# Patient Record
Sex: Female | Born: 1985 | Race: White | Hispanic: No | Marital: Single | Smoking: Former smoker
Health system: Southern US, Community
[De-identification: ages and names within clinical notes are randomized; demographics above are authoritative.]

## PROBLEM LIST (undated history)

## (undated) DIAGNOSIS — Z973 Presence of spectacles and contact lenses: Secondary | ICD-10-CM

## (undated) DIAGNOSIS — J189 Pneumonia, unspecified organism: Secondary | ICD-10-CM

## (undated) DIAGNOSIS — Z87442 Personal history of urinary calculi: Secondary | ICD-10-CM

## (undated) DIAGNOSIS — G709 Myoneural disorder, unspecified: Secondary | ICD-10-CM

## (undated) DIAGNOSIS — F32A Depression, unspecified: Secondary | ICD-10-CM

## (undated) DIAGNOSIS — N281 Cyst of kidney, acquired: Secondary | ICD-10-CM

## (undated) DIAGNOSIS — Z87448 Personal history of other diseases of urinary system: Secondary | ICD-10-CM

## (undated) DIAGNOSIS — M1990 Unspecified osteoarthritis, unspecified site: Secondary | ICD-10-CM

## (undated) DIAGNOSIS — G4700 Insomnia, unspecified: Secondary | ICD-10-CM

## (undated) DIAGNOSIS — F329 Major depressive disorder, single episode, unspecified: Secondary | ICD-10-CM

## (undated) DIAGNOSIS — R768 Other specified abnormal immunological findings in serum: Secondary | ICD-10-CM

## (undated) DIAGNOSIS — Z960 Presence of urogenital implants: Secondary | ICD-10-CM

## (undated) DIAGNOSIS — N189 Chronic kidney disease, unspecified: Secondary | ICD-10-CM

## (undated) DIAGNOSIS — K589 Irritable bowel syndrome without diarrhea: Secondary | ICD-10-CM

## (undated) DIAGNOSIS — D649 Anemia, unspecified: Secondary | ICD-10-CM

## (undated) DIAGNOSIS — K219 Gastro-esophageal reflux disease without esophagitis: Secondary | ICD-10-CM

## (undated) DIAGNOSIS — R51 Headache: Secondary | ICD-10-CM

## (undated) DIAGNOSIS — M797 Fibromyalgia: Secondary | ICD-10-CM

## (undated) DIAGNOSIS — E1065 Type 1 diabetes mellitus with hyperglycemia: Secondary | ICD-10-CM

## (undated) DIAGNOSIS — IMO0002 Reserved for concepts with insufficient information to code with codable children: Secondary | ICD-10-CM

## (undated) DIAGNOSIS — R519 Headache, unspecified: Secondary | ICD-10-CM

## (undated) DIAGNOSIS — I10 Essential (primary) hypertension: Secondary | ICD-10-CM

## (undated) DIAGNOSIS — F419 Anxiety disorder, unspecified: Secondary | ICD-10-CM

## (undated) DIAGNOSIS — F5000 Anorexia nervosa, unspecified: Secondary | ICD-10-CM

## (undated) HISTORY — PX: LEEP: SHX91

## (undated) HISTORY — PX: BREAST EXCISIONAL BIOPSY: SUR124

## (undated) HISTORY — PX: BREAST LUMPECTOMY: SHX2

## (undated) HISTORY — PX: BREAST BIOPSY: SHX20

---
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
## 2005-12-26 ENCOUNTER — Encounter: Admission: RE | Admit: 2005-12-26 | Discharge: 2005-12-26 | Payer: Self-pay | Admitting: Family Medicine

## 2006-07-08 ENCOUNTER — Encounter: Admission: RE | Admit: 2006-07-08 | Discharge: 2006-07-08 | Payer: Self-pay | Admitting: *Deleted

## 2007-01-02 HISTORY — PX: LEEP: SHX91

## 2007-12-29 ENCOUNTER — Emergency Department (HOSPITAL_COMMUNITY): Admission: EM | Admit: 2007-12-29 | Discharge: 2007-12-29 | Payer: Self-pay | Admitting: Family Medicine

## 2008-09-09 ENCOUNTER — Encounter: Payer: Self-pay | Admitting: Pediatric Cardiology

## 2008-10-07 ENCOUNTER — Encounter: Payer: Self-pay | Admitting: Obstetrics & Gynecology

## 2008-10-21 ENCOUNTER — Encounter: Payer: Self-pay | Admitting: Obstetrics and Gynecology

## 2008-10-23 ENCOUNTER — Ambulatory Visit: Payer: Self-pay | Admitting: Critical Care Medicine

## 2008-10-23 ENCOUNTER — Inpatient Hospital Stay (HOSPITAL_COMMUNITY): Admission: AD | Admit: 2008-10-23 | Discharge: 2008-10-25 | Payer: Self-pay | Admitting: Obstetrics and Gynecology

## 2008-10-27 ENCOUNTER — Inpatient Hospital Stay (HOSPITAL_COMMUNITY): Admission: AD | Admit: 2008-10-27 | Discharge: 2008-11-03 | Payer: Self-pay | Admitting: Obstetrics

## 2008-10-28 ENCOUNTER — Encounter: Payer: Self-pay | Admitting: Maternal and Fetal Medicine

## 2008-11-04 ENCOUNTER — Encounter: Payer: Self-pay | Admitting: Maternal & Fetal Medicine

## 2008-12-20 ENCOUNTER — Encounter: Payer: Self-pay | Admitting: Maternal and Fetal Medicine

## 2008-12-27 ENCOUNTER — Encounter: Payer: Self-pay | Admitting: Maternal and Fetal Medicine

## 2009-01-05 ENCOUNTER — Inpatient Hospital Stay (HOSPITAL_COMMUNITY): Admission: AD | Admit: 2009-01-05 | Discharge: 2009-01-05 | Payer: Self-pay | Admitting: Obstetrics & Gynecology

## 2010-01-22 ENCOUNTER — Encounter: Payer: Self-pay | Admitting: Obstetrics

## 2010-01-23 ENCOUNTER — Encounter: Payer: Self-pay | Admitting: Obstetrics & Gynecology

## 2010-03-19 LAB — CBC
HCT: 35.9 % — ABNORMAL LOW (ref 36.0–46.0)
Platelets: 184 10*3/uL (ref 150–400)
RBC: 4.37 MIL/uL (ref 3.87–5.11)
WBC: 7.9 10*3/uL (ref 4.0–10.5)

## 2010-03-19 LAB — BASIC METABOLIC PANEL
CO2: 20 mEq/L (ref 19–32)
Calcium: 8.4 mg/dL (ref 8.4–10.5)
Chloride: 106 mEq/L (ref 96–112)
GFR calc Af Amer: >60 mL/min (ref >60)
GFR calc non Af Amer: >60 mL/min (ref >60)
Potassium: 3.7 mEq/L (ref 3.5–5.1)

## 2010-03-19 LAB — RPR: RPR Ser Ql: NONREACTIVE

## 2010-04-05 LAB — GLUCOSE, CAPILLARY
Glucose-Capillary: 101 mg/dL — ABNORMAL HIGH (ref 70–99)
Glucose-Capillary: 128 mg/dL — ABNORMAL HIGH (ref 70–99)
Glucose-Capillary: 131 mg/dL — ABNORMAL HIGH (ref 70–99)
Glucose-Capillary: 152 mg/dL — ABNORMAL HIGH (ref 70–99)
Glucose-Capillary: 155 mg/dL — ABNORMAL HIGH (ref 70–99)
Glucose-Capillary: 169 mg/dL — ABNORMAL HIGH (ref 70–99)
Glucose-Capillary: 174 mg/dL — ABNORMAL HIGH (ref 70–99)
Glucose-Capillary: 60 mg/dL — ABNORMAL LOW (ref 70–99)

## 2010-04-05 LAB — T4, FREE: Free T4: 0.87 ng/dL (ref 0.80–1.80)

## 2010-04-05 LAB — TSH: TSH: 1.285 u[IU]/mL (ref 0.350–4.500)

## 2010-04-06 LAB — BASIC METABOLIC PANEL
BUN: 10 mg/dL (ref 6–23)
BUN: 10 mg/dL (ref 6–23)
BUN: 12 mg/dL (ref 6–23)
BUN: 15 mg/dL (ref 6–23)
BUN: 8 mg/dL (ref 6–23)
BUN: 9 mg/dL (ref 6–23)
BUN: 9 mg/dL (ref 6–23)
CO2: 10 mEq/L — ABNORMAL LOW (ref 19–32)
CO2: 18 mEq/L — ABNORMAL LOW (ref 19–32)
Calcium: 7.4 mg/dL — ABNORMAL LOW (ref 8.4–10.5)
Calcium: 7.7 mg/dL — ABNORMAL LOW (ref 8.4–10.5)
Calcium: 8.3 mg/dL — ABNORMAL LOW (ref 8.4–10.5)
Calcium: 8.5 mg/dL (ref 8.4–10.5)
Calcium: 8.6 mg/dL (ref 8.4–10.5)
Calcium: 8.7 mg/dL (ref 8.4–10.5)
Calcium: 8.8 mg/dL (ref 8.4–10.5)
Calcium: 9 mg/dL (ref 8.4–10.5)
Chloride: 109 mEq/L (ref 96–112)
Chloride: 110 mEq/L (ref 96–112)
Chloride: 110 mEq/L (ref 96–112)
Chloride: 111 mEq/L (ref 96–112)
Chloride: 112 mEq/L (ref 96–112)
Chloride: 112 mEq/L (ref 96–112)
Creatinine, Ser: 0.32 mg/dL — ABNORMAL LOW (ref 0.4–1.2)
Creatinine, Ser: 0.36 mg/dL — ABNORMAL LOW (ref 0.4–1.2)
Creatinine, Ser: 0.49 mg/dL (ref 0.4–1.2)
Creatinine, Ser: 0.5 mg/dL (ref 0.4–1.2)
Creatinine, Ser: 0.51 mg/dL (ref 0.4–1.2)
Creatinine, Ser: 0.81 mg/dL (ref 0.4–1.2)
GFR calc Af Amer: >60 mL/min (ref >60)
GFR calc Af Amer: >60 mL/min (ref >60)
GFR calc Af Amer: >60 mL/min (ref >60)
GFR calc Af Amer: >60 mL/min (ref >60)
GFR calc Af Amer: >60 mL/min (ref >60)
GFR calc Af Amer: >60 mL/min (ref >60)
GFR calc non Af Amer: >60 mL/min (ref >60)
GFR calc non Af Amer: >60 mL/min (ref >60)
GFR calc non Af Amer: >60 mL/min (ref >60)
GFR calc non Af Amer: >60 mL/min (ref >60)
GFR calc non Af Amer: >60 mL/min (ref >60)
GFR calc non Af Amer: >60 mL/min (ref >60)
GFR calc non Af Amer: >60 mL/min (ref >60)
GFR calc non Af Amer: >60 mL/min (ref >60)
GFR calc non Af Amer: >60 mL/min (ref >60)
Glucose, Bld: 130 mg/dL — ABNORMAL HIGH (ref 70–99)
Glucose, Bld: 166 mg/dL — ABNORMAL HIGH (ref 70–99)
Glucose, Bld: 194 mg/dL — ABNORMAL HIGH (ref 70–99)
Glucose, Bld: 251 mg/dL — ABNORMAL HIGH (ref 70–99)
Glucose, Bld: 405 mg/dL — ABNORMAL HIGH (ref 70–99)
Potassium: 3.1 mEq/L — ABNORMAL LOW (ref 3.5–5.1)
Potassium: 3.4 mEq/L — ABNORMAL LOW (ref 3.5–5.1)
Potassium: 3.4 mEq/L — ABNORMAL LOW (ref 3.5–5.1)
Potassium: 3.4 mEq/L — ABNORMAL LOW (ref 3.5–5.1)
Potassium: 3.5 mEq/L (ref 3.5–5.1)
Potassium: 3.7 mEq/L (ref 3.5–5.1)
Sodium: 128 mEq/L — ABNORMAL LOW (ref 135–145)
Sodium: 131 mEq/L — ABNORMAL LOW (ref 135–145)

## 2010-04-06 LAB — CBC
HCT: 45.2 % (ref 36.0–46.0)
Hemoglobin: 15.1 g/dL — ABNORMAL HIGH (ref 12.0–15.0)
MCHC: 34.1 g/dL (ref 30.0–36.0)
MCV: 85.1 fL (ref 78.0–100.0)
Platelets: 229 10*3/uL (ref 150–400)
RBC: 5.32 MIL/uL — ABNORMAL HIGH (ref 3.87–5.11)
RDW: 12.3 % (ref 11.5–15.5)
WBC: 14.5 10*3/uL — ABNORMAL HIGH (ref 4.0–10.5)

## 2010-04-06 LAB — URINE CULTURE

## 2010-04-06 LAB — GLUCOSE, CAPILLARY
Glucose-Capillary: 106 mg/dL — ABNORMAL HIGH (ref 70–99)
Glucose-Capillary: 137 mg/dL — ABNORMAL HIGH (ref 70–99)
Glucose-Capillary: 137 mg/dL — ABNORMAL HIGH (ref 70–99)
Glucose-Capillary: 152 mg/dL — ABNORMAL HIGH (ref 70–99)
Glucose-Capillary: 157 mg/dL — ABNORMAL HIGH (ref 70–99)
Glucose-Capillary: 157 mg/dL — ABNORMAL HIGH (ref 70–99)
Glucose-Capillary: 157 mg/dL — ABNORMAL HIGH (ref 70–99)
Glucose-Capillary: 166 mg/dL — ABNORMAL HIGH (ref 70–99)
Glucose-Capillary: 167 mg/dL — ABNORMAL HIGH (ref 70–99)
Glucose-Capillary: 170 mg/dL — ABNORMAL HIGH (ref 70–99)
Glucose-Capillary: 172 mg/dL — ABNORMAL HIGH (ref 70–99)
Glucose-Capillary: 172 mg/dL — ABNORMAL HIGH (ref 70–99)
Glucose-Capillary: 175 mg/dL — ABNORMAL HIGH (ref 70–99)
Glucose-Capillary: 186 mg/dL — ABNORMAL HIGH (ref 70–99)
Glucose-Capillary: 192 mg/dL — ABNORMAL HIGH (ref 70–99)
Glucose-Capillary: 194 mg/dL — ABNORMAL HIGH (ref 70–99)
Glucose-Capillary: 202 mg/dL — ABNORMAL HIGH (ref 70–99)
Glucose-Capillary: 269 mg/dL — ABNORMAL HIGH (ref 70–99)
Glucose-Capillary: 275 mg/dL — ABNORMAL HIGH (ref 70–99)
Glucose-Capillary: 285 mg/dL — ABNORMAL HIGH (ref 70–99)
Glucose-Capillary: 377 mg/dL — ABNORMAL HIGH (ref 70–99)
Glucose-Capillary: 463 mg/dL — ABNORMAL HIGH (ref 70–99)
Glucose-Capillary: 71 mg/dL (ref 70–99)
Glucose-Capillary: 81 mg/dL (ref 70–99)
Glucose-Capillary: 85 mg/dL (ref 70–99)
Glucose-Capillary: 95 mg/dL (ref 70–99)

## 2010-04-06 LAB — DIC (DISSEMINATED INTRAVASCULAR COAGULATION)PANEL
INR: 1.13 (ref 0.00–1.49)
Prothrombin Time: 14.4 seconds (ref 11.6–15.2)
Smear Review: NONE SEEN
aPTT: 29 seconds (ref 24–37)

## 2010-04-06 LAB — URINALYSIS, ROUTINE W REFLEX MICROSCOPIC
Bilirubin Urine: NEGATIVE
Ketones, ur: >80 mg/dL — AB
Nitrite: NEGATIVE
Specific Gravity, Urine: 1.02 (ref 1.005–1.030)
Urobilinogen, UA: 0.2 mg/dL (ref 0.0–1.0)

## 2010-04-06 LAB — COMPREHENSIVE METABOLIC PANEL
AST: 11 U/L (ref 0–37)
BUN: 20 mg/dL (ref 6–23)
CO2: 5 mEq/L — CL (ref 19–32)
Chloride: 98 mEq/L (ref 96–112)
Creatinine, Ser: 1.05 mg/dL (ref 0.4–1.2)
GFR calc non Af Amer: >60 mL/min (ref >60)
Total Bilirubin: 1.7 mg/dL — ABNORMAL HIGH (ref 0.3–1.2)

## 2010-04-06 LAB — BLOOD GAS, ARTERIAL
Bicarbonate: 8.2 mEq/L — ABNORMAL LOW (ref 20.0–24.0)
TCO2: 8.8 mmol/L (ref 0–100)
pO2, Arterial: 83.1 mmHg (ref 80.0–100.0)

## 2010-04-06 LAB — KETONES, URINE: Ketones, ur: NEGATIVE mg/dL

## 2010-04-06 LAB — KETONES, QUALITATIVE

## 2010-04-06 LAB — MAGNESIUM
Magnesium: 1.7 mg/dL (ref 1.5–2.5)
Magnesium: 1.8 mg/dL (ref 1.5–2.5)

## 2010-04-06 LAB — URIC ACID: Uric Acid, Serum: 4.6 mg/dL (ref 2.4–7.0)

## 2010-04-06 LAB — HEMOGLOBIN A1C
Hgb A1c MFr Bld: 11.4 % — ABNORMAL HIGH (ref 4.6–6.1)
Mean Plasma Glucose: 280 mg/dL

## 2010-04-06 LAB — WET PREP, GENITAL

## 2010-04-06 LAB — CREATININE CLEARANCE, URINE, 24 HOUR
Collection Interval-CRCL: 24 hours
Creatinine, Urine: 31.9 mg/dL
Creatinine: 0.57 mg/dL (ref 0.40–1.20)
Urine Total Volume-CRCL: 4200 mL

## 2010-04-06 LAB — PROTEIN, URINE, 24 HOUR: Protein, 24H Urine: 84 mg/d (ref 50–100)

## 2010-04-06 LAB — PHOSPHORUS: Phosphorus: 3.9 mg/dL (ref 2.3–4.6)

## 2010-10-06 LAB — POCT PREGNANCY, URINE: Preg Test, Ur: NEGATIVE

## 2010-10-07 ENCOUNTER — Encounter: Payer: Self-pay | Admitting: *Deleted

## 2010-10-07 ENCOUNTER — Emergency Department (HOSPITAL_BASED_OUTPATIENT_CLINIC_OR_DEPARTMENT_OTHER)
Admission: EM | Admit: 2010-10-07 | Discharge: 2010-10-07 | Disposition: A | Discharge status: Home / Self Care | Attending: Emergency Medicine | Admitting: Emergency Medicine

## 2010-10-07 DIAGNOSIS — E119 Type 2 diabetes mellitus without complications: Secondary | ICD-10-CM | POA: Insufficient documentation

## 2010-10-07 DIAGNOSIS — M778 Other enthesopathies, not elsewhere classified: Secondary | ICD-10-CM

## 2010-10-07 DIAGNOSIS — M65839 Other synovitis and tenosynovitis, unspecified forearm: Secondary | ICD-10-CM | POA: Insufficient documentation

## 2010-10-07 DIAGNOSIS — F17200 Nicotine dependence, unspecified, uncomplicated: Secondary | ICD-10-CM | POA: Insufficient documentation

## 2010-10-07 MED ORDER — HYDROCODONE-ACETAMINOPHEN 5-325 MG PO TABS: 2.0000 | ORAL_TABLET | ORAL | Status: AC | PRN

## 2010-10-07 NOTE — ED Provider Notes (Signed)
History     CSN: [id number] Arrival date & time: 10/07/2010 11:59 AM  Chief Complaint  Patient presents with  . Wrist Pain    (Consider location/radiation/quality/duration/timing/severity/associated sxs/prior treatment) HPI History provided by Pt. Pt c/o gradually worsening, non-traumatic right medial wrist pain x 2 days.   No-radiating.  Aggravated by movement.  Associated w/ mild edema.  No fever, skin changes, paresthesias.  Does not have pain in any other joint.  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    Past Medical History  Diagnosis Date  . Diabetes mellitus     Past Surgical History  Procedure Date  . Other surgical history     cyst removed from face    No family history on file.  History  Substance Use Topics  . Smoking status: Current Everyday Smoker  . Smokeless tobacco: Not on file  . Alcohol Use: No    OB History    Grav Para Term Preterm Abortions TAB SAB Ect Mult Living                  Review of Systems  All other systems reviewed and are negative.    Allergies  Adhesive and Ivp dye  Home Medications   Current Outpatient Rx  Name Route Sig Dispense Refill  . ALPRAZOLAM 0.25 MG PO TABS Oral Take 0.25 mg by mouth as needed.      . DULOXETINE HCL 60 MG PO CPEP Oral Take 60 mg by mouth daily.        BP 120/73  Pulse 90  Temp(Src) 98.5 F (36.9 C) (Oral)  Resp 18  SpO2 100%  Physical Exam  Nursing note and vitals reviewed. Constitutional: She is oriented to person, place, and time. She appears well-developed and well-nourished. No distress.  HENT:  Head: Normocephalic and atraumatic.  Eyes:       Normal appearance  Neck: Normal range of motion.  Musculoskeletal:       Right wrist w/out deformity, edema, erythema.  Points to pain at distal right radius. Non-tender.  Full ROM but pain w/ extreme flexion/extension, minimal medial/lateral rotation and supination.  No pain w/ ROM of fingers.  2+ radial pulse.   Distal sensation intact.  Elbow exam nml.   Neurological: She is alert and oriented to person, place, and time.  Psychiatric: She has a normal mood and affect. Her behavior is normal.    ED Course  Procedures (including critical care time)  Labs Reviewed - No data to display No results found.   1. Tendonitis of wrist, right       MDM  Pt presents w/ non-traumatic, medial right wrist pain.  No signs of injury/infection on exam.   Most likely has tendonitis.  Nursing staff placed in velcrow wrist splint.  I recommended rest, ice,  Ibuprofen.  Discharged home w/ short course of pain medication.  Return precautions discussed.        Remer Macho, [place] 10/08/10 1204

## 2010-10-07 NOTE — ED Notes (Signed)
R wrist pain, hurts to move, no meds taken, no injury,, swollen

## 2010-10-08 NOTE — ED Provider Notes (Signed)
Medical screening examination/treatment/procedure(s) were performed by non-physician practitioner and as supervising physician I was immediately available for consultation/collaboration.   Hoy Morn, MD 10/08/10 2118

## 2010-10-10 ENCOUNTER — Emergency Department (HOSPITAL_BASED_OUTPATIENT_CLINIC_OR_DEPARTMENT_OTHER)
Admission: EM | Admit: 2010-10-10 | Discharge: 2010-10-10 | Disposition: A | Discharge status: Home / Self Care | Attending: Emergency Medicine | Admitting: Emergency Medicine

## 2010-10-10 ENCOUNTER — Encounter (HOSPITAL_BASED_OUTPATIENT_CLINIC_OR_DEPARTMENT_OTHER): Payer: Self-pay | Admitting: *Deleted

## 2010-10-10 ENCOUNTER — Emergency Department (INDEPENDENT_AMBULATORY_CARE_PROVIDER_SITE_OTHER)

## 2010-10-10 DIAGNOSIS — E119 Type 2 diabetes mellitus without complications: Secondary | ICD-10-CM | POA: Insufficient documentation

## 2010-10-10 DIAGNOSIS — M25539 Pain in unspecified wrist: Secondary | ICD-10-CM | POA: Insufficient documentation

## 2010-10-10 DIAGNOSIS — F17200 Nicotine dependence, unspecified, uncomplicated: Secondary | ICD-10-CM | POA: Insufficient documentation

## 2010-10-10 DIAGNOSIS — M779 Enthesopathy, unspecified: Secondary | ICD-10-CM

## 2010-10-10 DIAGNOSIS — S6990XA Unspecified injury of unspecified wrist, hand and finger(s), initial encounter: Secondary | ICD-10-CM

## 2010-10-10 DIAGNOSIS — X58XXXA Exposure to other specified factors, initial encounter: Secondary | ICD-10-CM

## 2010-10-10 NOTE — ED Notes (Signed)
Pt return seen here sat for same, states she is out of pain meds and pain cont. Didn't f/u with ortho

## 2010-10-10 NOTE — ED Provider Notes (Signed)
History     CSN: [id number] Arrival date & time: 10/10/2010  6:41 PM  Chief Complaint  Patient presents with  . Wrist Pain    (Consider location/radiation/quality/duration/timing/severity/associated sxs/prior treatment) HPI Comments: Pt states that the pain medication helped, but she is out of them:pt states that she was 3 days ago  Patient is a 25 y.o. female presenting with wrist pain. The history is provided by the patient. No language interpreter was used.  Wrist Pain This is a new problem. The current episode started in the past 7 days. The problem occurs constantly. The problem has been unchanged. The symptoms are aggravated by bending. She has tried immobilization for the symptoms. The treatment provided no relief.    Past Medical History  Diagnosis Date  . Diabetes mellitus     Past Surgical History  Procedure Date  . Other surgical history     cyst removed from face    History reviewed. No pertinent family history.  History  Substance Use Topics  . Smoking status: Current Everyday Smoker  . Smokeless tobacco: Not on file  . Alcohol Use: No    OB History    Grav Para Term Preterm Abortions TAB SAB Ect Mult Living                  Review of Systems  All other systems reviewed and are negative.    Allergies  Ivp dye and Adhesive  Home Medications   Current Outpatient Rx  Name Route Sig Dispense Refill  . ALPRAZOLAM 0.25 MG PO TABS Oral Take 0.25 mg by mouth daily as needed. For anxiety    . DULOXETINE HCL 30 MG PO CPEP Oral Take 30 mg by mouth daily. Take with 60mg  to equal 90mg  dose     . DULOXETINE HCL 60 MG PO CPEP Oral Take 60 mg by mouth daily. Take with 30mg  to equal 90mg  dose    . HYDROCODONE-ACETAMINOPHEN 5-325 MG PO TABS Oral Take 2 tablets by mouth every 4 (four) hours as needed for pain. 10 tablet 0  . VITAMIN C 500 MG PO TABS Oral Take 500 mg by mouth daily.        BP 116/75  Pulse 98  Temp 98.3 F (36.8 C)  Resp 16  Ht 5\' 2"   (1.575 m)  Wt 105 lb (47.628 kg)  BMI 19.20 kg/m2  SpO2 100%  LMP 09/19/2010  Physical Exam  Nursing note and vitals reviewed. Constitutional: She appears well-developed and well-nourished.  Cardiovascular: Normal rate and regular rhythm.   Pulmonary/Chest: Effort normal and breath sounds normal.  Musculoskeletal: Normal range of motion.       Pt tender on the medial aspect of the right wrist:no obvious deformity or swelling noted to the area  Neurological: She is alert.    ED Course  Procedures (including critical care time)  Labs Reviewed - No data to display Dg Wrist Complete Right  10/10/2010  *RADIOLOGY REPORT*  Clinical Data: Injury with pain  RIGHT WRIST - COMPLETE 3+ VIEW  Comparison: None  Findings: No evidence of fracture, dislocation, degenerative change or other focal finding.  IMPRESSION: Normal radiographs  Original Report Authenticated By: Jules Schick, M.D.     No diagnosis found.    MDM  Pt given a different splint for comfort:pt okay to follow up with ortho:no further medication to be given        Glendell Docker, NP 10/10/10 1955

## 2010-10-12 ENCOUNTER — Emergency Department (HOSPITAL_COMMUNITY)
Admission: EM | Admit: 2010-10-12 | Discharge: 2010-10-12 | Discharge status: Against Medical Advice | Attending: Emergency Medicine | Admitting: Emergency Medicine

## 2010-10-12 DIAGNOSIS — M79609 Pain in unspecified limb: Secondary | ICD-10-CM | POA: Insufficient documentation

## 2010-10-12 DIAGNOSIS — M25539 Pain in unspecified wrist: Secondary | ICD-10-CM | POA: Insufficient documentation

## 2010-10-12 DIAGNOSIS — X58XXXA Exposure to other specified factors, initial encounter: Secondary | ICD-10-CM | POA: Insufficient documentation

## 2010-10-13 LAB — GLUCOSE, CAPILLARY: Glucose-Capillary: 334 mg/dL — ABNORMAL HIGH (ref 70–99)

## 2010-10-17 NOTE — ED Provider Notes (Signed)
Medical screening examination/treatment/procedure(s) were performed by non-physician practitioner and as supervising physician I was immediately available for consultation/collaboration.  Babette Relic, MD 10/17/10 2113

## 2010-11-20 DIAGNOSIS — K589 Irritable bowel syndrome without diarrhea: Secondary | ICD-10-CM | POA: Insufficient documentation

## 2011-09-16 ENCOUNTER — Emergency Department (HOSPITAL_BASED_OUTPATIENT_CLINIC_OR_DEPARTMENT_OTHER)

## 2011-09-16 ENCOUNTER — Encounter (HOSPITAL_BASED_OUTPATIENT_CLINIC_OR_DEPARTMENT_OTHER): Payer: Self-pay | Admitting: *Deleted

## 2011-09-16 ENCOUNTER — Inpatient Hospital Stay (HOSPITAL_BASED_OUTPATIENT_CLINIC_OR_DEPARTMENT_OTHER)
Admission: EM | Admit: 2011-09-16 | Discharge: 2011-09-18 | DRG: 638 | Disposition: A | Discharge status: Home / Self Care | Attending: Internal Medicine | Admitting: Internal Medicine

## 2011-09-16 DIAGNOSIS — E109 Type 1 diabetes mellitus without complications: Secondary | ICD-10-CM | POA: Diagnosis present

## 2011-09-16 DIAGNOSIS — Z91199 Patient's noncompliance with other medical treatment and regimen due to unspecified reason: Secondary | ICD-10-CM

## 2011-09-16 DIAGNOSIS — J111 Influenza due to unidentified influenza virus with other respiratory manifestations: Secondary | ICD-10-CM | POA: Diagnosis present

## 2011-09-16 DIAGNOSIS — F3289 Other specified depressive episodes: Secondary | ICD-10-CM | POA: Diagnosis present

## 2011-09-16 DIAGNOSIS — E876 Hypokalemia: Secondary | ICD-10-CM | POA: Diagnosis present

## 2011-09-16 DIAGNOSIS — F17200 Nicotine dependence, unspecified, uncomplicated: Secondary | ICD-10-CM | POA: Diagnosis present

## 2011-09-16 DIAGNOSIS — E860 Dehydration: Secondary | ICD-10-CM | POA: Diagnosis present

## 2011-09-16 DIAGNOSIS — E871 Hypo-osmolality and hyponatremia: Secondary | ICD-10-CM | POA: Diagnosis present

## 2011-09-16 DIAGNOSIS — E1110 Type 2 diabetes mellitus with ketoacidosis without coma: Secondary | ICD-10-CM

## 2011-09-16 DIAGNOSIS — J209 Acute bronchitis, unspecified: Secondary | ICD-10-CM | POA: Diagnosis present

## 2011-09-16 DIAGNOSIS — F411 Generalized anxiety disorder: Secondary | ICD-10-CM | POA: Diagnosis present

## 2011-09-16 DIAGNOSIS — D72829 Elevated white blood cell count, unspecified: Secondary | ICD-10-CM | POA: Diagnosis present

## 2011-09-16 DIAGNOSIS — Z8249 Family history of ischemic heart disease and other diseases of the circulatory system: Secondary | ICD-10-CM

## 2011-09-16 DIAGNOSIS — Z720 Tobacco use: Secondary | ICD-10-CM

## 2011-09-16 DIAGNOSIS — Z9119 Patient's noncompliance with other medical treatment and regimen: Secondary | ICD-10-CM

## 2011-09-16 DIAGNOSIS — F329 Major depressive disorder, single episode, unspecified: Secondary | ICD-10-CM | POA: Diagnosis present

## 2011-09-16 DIAGNOSIS — E1010 Type 1 diabetes mellitus with ketoacidosis without coma: Principal | ICD-10-CM | POA: Diagnosis present

## 2011-09-16 HISTORY — DX: Anxiety disorder, unspecified: F41.9

## 2011-09-16 LAB — POCT I-STAT 3, VENOUS BLOOD GAS (G3P V)
O2 Saturation: 62 %
Patient temperature: 37
TCO2: 8 mmol/L (ref 0–100)
pCO2, Ven: 26.1 mmHg — ABNORMAL LOW (ref 45.0–50.0)

## 2011-09-16 LAB — URINALYSIS, ROUTINE W REFLEX MICROSCOPIC
Bilirubin Urine: NEGATIVE
Ketones, ur: >80 mg/dL — AB
Leukocytes, UA: NEGATIVE
Nitrite: NEGATIVE
Nitrite: NEGATIVE
Specific Gravity, Urine: 1.029 (ref 1.005–1.030)
Specific Gravity, Urine: 1.026 (ref 1.005–1.030)
Urobilinogen, UA: 0.2 mg/dL (ref 0.0–1.0)
Urobilinogen, UA: 0.2 mg/dL (ref 0.0–1.0)
pH: 5 (ref 5.0–8.0)

## 2011-09-16 LAB — BASIC METABOLIC PANEL
BUN: 17 mg/dL (ref 6–23)
Calcium: 8.2 mg/dL — ABNORMAL LOW (ref 8.4–10.5)
Calcium: 8.5 mg/dL (ref 8.4–10.5)
Chloride: 110 mEq/L (ref 96–112)
Creatinine, Ser: 0.4 mg/dL — ABNORMAL LOW (ref 0.50–1.10)
Creatinine, Ser: 0.35 mg/dL — ABNORMAL LOW (ref 0.50–1.10)
GFR calc Af Amer: >90 mL/min (ref >90)
GFR calc Af Amer: >90 mL/min (ref >90)
GFR calc non Af Amer: >90 mL/min (ref >90)
Sodium: 136 mEq/L (ref 135–145)

## 2011-09-16 LAB — GLUCOSE, CAPILLARY
Glucose-Capillary: 460 mg/dL — ABNORMAL HIGH (ref 70–99)
Glucose-Capillary: 245 mg/dL — ABNORMAL HIGH (ref 70–99)
Glucose-Capillary: 235 mg/dL — ABNORMAL HIGH (ref 70–99)
Glucose-Capillary: 252 mg/dL — ABNORMAL HIGH (ref 70–99)
Glucose-Capillary: 214 mg/dL — ABNORMAL HIGH (ref 70–99)
Glucose-Capillary: 199 mg/dL — ABNORMAL HIGH (ref 70–99)
Glucose-Capillary: 89 mg/dL (ref 70–99)

## 2011-09-16 LAB — CBC WITH DIFFERENTIAL/PLATELET
Band Neutrophils: 6 % (ref 0–10)
Basophils Absolute: 0 10*3/uL (ref 0.0–0.1)
Basophils Relative: 0 % (ref 0–1)
Blasts: 0 %
Hemoglobin: 14.6 g/dL (ref 12.0–15.0)
Lymphocytes Relative: 15 % (ref 12–46)
Lymphs Abs: 2.8 10*3/uL (ref 0.7–4.0)
MCH: 27.7 pg (ref 26.0–34.0)
MCHC: 33.3 g/dL (ref 30.0–36.0)
Myelocytes: 0 %
Neutro Abs: 14.8 10*3/uL — ABNORMAL HIGH (ref 1.7–7.7)
Neutrophils Relative %: 73 % (ref 43–77)
Promyelocytes Absolute: 0 %
RDW: 12.8 % (ref 11.5–15.5)

## 2011-09-16 LAB — COMPREHENSIVE METABOLIC PANEL
ALT: 12 U/L (ref 0–35)
AST: 10 U/L (ref 0–37)
CO2: 7 mEq/L — CL (ref 19–32)
Calcium: 9.4 mg/dL (ref 8.4–10.5)
Chloride: 97 mEq/L (ref 96–112)
Creatinine, Ser: 0.5 mg/dL (ref 0.50–1.10)
GFR calc Af Amer: >90 mL/min (ref >90)
GFR calc non Af Amer: >90 mL/min (ref >90)
Glucose, Bld: 444 mg/dL — ABNORMAL HIGH (ref 70–99)
Sodium: 132 mEq/L — ABNORMAL LOW (ref 135–145)
Total Bilirubin: 0.2 mg/dL — ABNORMAL LOW (ref 0.3–1.2)

## 2011-09-16 LAB — URINE MICROSCOPIC-ADD ON

## 2011-09-16 MED ORDER — DEXTROSE 5 % IV SOLN
1.0000 g | Freq: Once | INTRAVENOUS | Status: AC
  Administered 2011-09-16: 1 g via INTRAVENOUS
  Filled 2011-09-16: qty 10

## 2011-09-16 MED ORDER — SODIUM CHLORIDE 0.9 % IV SOLN
1000.0000 mL | Freq: Once | INTRAVENOUS | Status: AC
  Administered 2011-09-16: 1000 mL via INTRAVENOUS

## 2011-09-16 MED ORDER — DEXTROSE 50 % IV SOLN: 25.0000 mL | INTRAVENOUS | Status: DC | PRN

## 2011-09-16 MED ORDER — DEXTROSE-NACL 5-0.45 % IV SOLN
INTRAVENOUS | Status: DC
  Administered 2011-09-16: 15:00:00 via INTRAVENOUS

## 2011-09-16 MED ORDER — GUAIFENESIN-DM 100-10 MG/5ML PO SYRP
5.0000 mL | ORAL_SOLUTION | ORAL | Status: DC | PRN
  Administered 2011-09-17: 5 mL via ORAL
  Filled 2011-09-16 (×2): qty 5

## 2011-09-16 MED ORDER — ALPRAZOLAM 0.25 MG PO TABS: 0.2500 mg | ORAL_TABLET | Freq: Every day | ORAL | Status: DC | PRN

## 2011-09-16 MED ORDER — ENOXAPARIN SODIUM 40 MG/0.4ML ~~LOC~~ SOLN
40.0000 mg | Freq: Every day | SUBCUTANEOUS | Status: DC
  Filled 2011-09-16 (×3): qty 0.4

## 2011-09-16 MED ORDER — ALBUTEROL SULFATE (5 MG/ML) 0.5% IN NEBU: 2.5000 mg | INHALATION_SOLUTION | RESPIRATORY_TRACT | Status: DC | PRN

## 2011-09-16 MED ORDER — SODIUM CHLORIDE 0.9 % IV SOLN
INTRAVENOUS | Status: DC
  Administered 2011-09-16: 4 [IU]/h via INTRAVENOUS
  Administered 2011-09-16: 3.2 [IU]/h via INTRAVENOUS

## 2011-09-16 MED ORDER — SODIUM CHLORIDE 0.9 % IV SOLN
INTRAVENOUS | Status: DC
  Administered 2011-09-16: 23:00:00 via INTRAVENOUS

## 2011-09-16 MED ORDER — DEXTROSE 5 % IV SOLN
1.0000 g | INTRAVENOUS | Status: DC
  Administered 2011-09-17: 1 g via INTRAVENOUS
  Filled 2011-09-16 (×3): qty 10

## 2011-09-16 MED ORDER — ACETAMINOPHEN 325 MG PO TABS
ORAL_TABLET | ORAL | Status: AC
  Administered 2011-09-16: 650 mg via ORAL
  Filled 2011-09-16: qty 2

## 2011-09-16 MED ORDER — SODIUM CHLORIDE 0.9 % IV SOLN
INTRAVENOUS | Status: AC
  Administered 2011-09-16: 1.2 [IU]/h via INTRAVENOUS
  Filled 2011-09-16: qty 1

## 2011-09-16 MED ORDER — INSULIN REGULAR HUMAN 100 UNIT/ML IJ SOLN
INTRAMUSCULAR | Status: AC
  Filled 2011-09-16: qty 1

## 2011-09-16 MED ORDER — ACETAMINOPHEN 325 MG PO TABS
650.0000 mg | ORAL_TABLET | Freq: Once | ORAL | Status: AC
  Administered 2011-09-16: 650 mg via ORAL

## 2011-09-16 MED ORDER — ACETAMINOPHEN 325 MG PO TABS
650.0000 mg | ORAL_TABLET | Freq: Four times a day (QID) | ORAL | Status: DC | PRN
  Administered 2011-09-17 – 2011-09-18 (×3): 650 mg via ORAL
  Filled 2011-09-16 (×3): qty 2

## 2011-09-16 MED ORDER — DEXTROSE-NACL 5-0.45 % IV SOLN
INTRAVENOUS | Status: AC
  Administered 2011-09-16 – 2011-09-17 (×3): via INTRAVENOUS

## 2011-09-16 MED ORDER — ONDANSETRON HCL 4 MG/2ML IJ SOLN: 4.0000 mg | Freq: Three times a day (TID) | INTRAMUSCULAR | Status: DC | PRN

## 2011-09-16 MED ORDER — DEXTROSE 5 % IV SOLN
500.0000 mg | INTRAVENOUS | Status: DC
  Administered 2011-09-16 – 2011-09-17 (×2): 500 mg via INTRAVENOUS
  Filled 2011-09-16 (×4): qty 500

## 2011-09-16 MED ORDER — SODIUM CHLORIDE 0.9 % IV SOLN: 1000.0000 mL | INTRAVENOUS | Status: DC

## 2011-09-16 MED ORDER — ACETAMINOPHEN 325 MG PO TABS: 650.0000 mg | ORAL_TABLET | ORAL | Status: DC | PRN

## 2011-09-16 MED ORDER — POTASSIUM CHLORIDE 10 MEQ/100ML IV SOLN
10.0000 meq | INTRAVENOUS | Status: AC
  Administered 2011-09-17 (×2): 10 meq via INTRAVENOUS
  Filled 2011-09-16 (×2): qty 100

## 2011-09-16 MED ORDER — ONDANSETRON HCL 4 MG/2ML IJ SOLN
4.0000 mg | Freq: Four times a day (QID) | INTRAMUSCULAR | Status: DC | PRN
  Administered 2011-09-17: 4 mg via INTRAVENOUS
  Filled 2011-09-16: qty 2

## 2011-09-16 MED ORDER — SODIUM CHLORIDE 0.9 % IV SOLN: INTRAVENOUS | Status: DC

## 2011-09-16 NOTE — ED Notes (Signed)
Pt transported at this time by [facility] to [phone]- [phone] x 1 to call report-RN will call back.

## 2011-09-16 NOTE — Progress Notes (Signed)
26 y/o female with h/o Diabetes mellitus, who is insulin dependent came to [facility] with flu like symptoms, found to be in DKA. Patient was started on insulin drip and iv fluids per Dr Jeanell Sparrow, ER doc at [facility]. Patient has significant anion gap of 28, and i asked Dr Jeanell Sparrow to check another BMP. Patient will be transferred to Step down unit.

## 2011-09-16 NOTE — ED Notes (Signed)
went to [facility] on 13th for congestion/sinus infection, taking antibiotics & prednisone., started experiencing flu-like symptoms on 14th, feeling more fatigue, no appetite, glucose (469) checked by fire department before arrival, she administered 8 units of novalog around 12:00 noon, given 250 ml nacl by ems, no n/v/d, no fever

## 2011-09-16 NOTE — H&P (Signed)
[facility] History and Physical  Tara Bullock DOB: 03-Feb-1985 DOA: 09/16/2011  Referring physician: Pattricia Boss, MD PCP: Rachell Cipro, MD   Chief Complaint: Sore throat, congestion, cold, cough, fevers, generalized body aches and weakness.  HPI: Tara Bullock is a 26 y.o. female with history of type I DM, supposed to be on insulin pump but noncompliant with any insulins or blood sugar monitoring for few months who was transferred from [facility] for evaluation and management of DKA. Patient says that she's been unwell for approximately a week. She was exposed to her 61-year-old daughter with similar symptoms. She then started having sore throat, congestion of her head and chest, cough with sputum which was initially clear but subsequently became productive of yellow/brown sputum, intermittent fevers as high as the 101F, wheezing, generalized body aches and weakness, reduced appetite. She indicates that in "it felt like I had the flu". She went to an urgent care 4 days back and was prescribed Augmentin and oral prednisone and told to have sinusitis and a viral infection. Since then her upper respiratory symptoms have mildly improved but she continued to feel generally weak. Today she checked her CBG which was 381 mg/dL and gave herself an insulin shot and presented to the emergency department. In the ED her lab work is significant for sodium 132, bicarbonate 7, glucose 444, anion gap of 28, pH of 7.030 on venous blood gas, white blood cell 18.7, UA suspicious for UTI, urine pregnancy test negative and chest x-ray negative for acute findings. She was bolused with IV normal saline, started on IV insulin drip, provided a dose of Rocephin for presumed urinary tract infection and transferred to step down unit for further evaluation and management.   Review of Systems: All systems reviewed and apart from history of presenting illness is pertinent for nausea but  no vomiting or diarrhea. Her last hemoglobin A1c checked months ago was in the 10 range. Her daughter apparently is improving. She has a chronic rash on her right shin related to the diabetes. Denies polyuria or polydipsia but has reduced appetite. Denies urinary frequency or dysuria.  Past Medical History  Diagnosis Date  . Diabetes mellitus   . Anxiety   . Skin cancer    Past Surgical History  Procedure Date  . Other surgical history     cyst removed from face  . Back surgery    Social History:  reports that she has been smoking.  She does not have any smokeless tobacco history on file. She reports that she does not drink alcohol or use illicit drugs. Patient is married and her spouse is at the bedside. She is independent of activities of daily living. She smokes a pack of cigarettes per day and drinks alcohol occasionally.  Allergies  Allergen Reactions  . Ivp Dye (Iodinated Diagnostic Agents) Anaphylaxis and Hives  . Adhesive (Tape) Hives and Rash    Family History  Problem Relation Age of Onset  . Hypertension Mother   . Hypertension Father     Prior to Admission medications   Medication Sig Start Date End Date Taking? Authorizing Provider  ALPRAZolam (XANAX) 0.25 MG tablet Take 0.25 mg by mouth daily as needed. For anxiety   Yes Historical Provider, MD  amoxicillin-clavulanate (AUGMENTIN) 875-125 MG per tablet Take 1 tablet by mouth 2 (two) times daily.   Yes Historical Provider, MD  insulin lispro (HUMALOG) 100 UNIT/ML injection Inject 8 Units into the skin 3 (three) times daily before  meals.    Yes Historical Provider, MD  predniSONE (DELTASONE) 10 MG tablet Take 10 mg by mouth daily.   Yes Historical Provider, MD  DULoxetine (CYMBALTA) 30 MG capsule Take 30 mg by mouth daily. Take with 60mg  to equal 90mg  dose     Historical Provider, MD  DULoxetine (CYMBALTA) 60 MG capsule Take 60 mg by mouth daily. Take with 30mg  to equal 90mg  dose    Historical Provider, MD  Patient  says that she has not been using Cymbalta for many months now. She rarely uses the Xanax.   Physical Exam: Filed Vitals:   09/16/11 1700 09/16/11 1833 09/16/11 1945 09/16/11 2057  BP: 103/63 112/70 109/68 109/66  Pulse:   98 96  Temp:   98.2 F (36.8 C)   TempSrc:   Oral   Resp: 16 14 15 17   Height:    5\' 1"  (1.549 m)  Weight:    49.5 kg (109 lb 2 oz)  SpO2: 100% 100% 99% 100%   Patient was examined with her female nurse as chaperone in the room General exam: Moderately built and nourished young female, slightly ill looking, lying comfortably supine in bed without any distress.  Head, eyes and ENT: Nontraumatic and normocephalic. Pupils equally reacting to light and accommodation. Oral mucosa is dry but without any acute findings.  Lymphatics: No lymphadenopathy.  Neck: Supple. No JVD or thyromegaly. No carotid bruit. Respiratory system: Clear to auscultation. No increased work of breathing.  Cardiovascular system: S1 and S2 heard, regular rate and rhythm. No murmurs, gallops or pedal edema. Telemetry shows normal sinus rhythm in the 90s. Gastrointestinal system: Abdomen is nondistended, soft and nontender. No organomegaly or masses appreciated. Normal bowel sounds are heard. Central nervous system: Alert and oriented x4. No focal neurological deficits.  Extremities: Symmetric 5 x 5 power. Peripheral pulses symmetrically felt. Slightly reddish and scaly rash approximately 2 cm in diameter without any acute findings on right midshin. Skin: No acute findings. Patient has a tattoo on her lower back. Psychiatry: Pleasant and cooperative and establishes eye contact. Musculoskeletal system: No acute findings.  Labs on Admission:  Basic Metabolic Panel:  Lab [id number] 09/16/11 1310  NA 133* 132*  K 3.9 4.2  CL 104 97  CO2 9* 7*  GLUCOSE 245* 444*  BUN 17 24*  CREATININE 0.40* 0.50  CALCIUM 8.2* 9.4  MG -- --  PHOS -- --   Liver Function Tests:  Lab 09/16/11 1310  AST 10    ALT 12  ALKPHOS 136*  BILITOT 0.2*  PROT 7.7  ALBUMIN 3.5   No results found for this basename: LIPASE:5,AMYLASE:5 in the last 168 hours No results found for this basename: AMMONIA:5 in the last 168 hours CBC:  Lab 09/16/11 1310  WBC 18.7*  NEUTROABS 14.8*  HGB 14.6  HCT 43.8  MCV 83.1  PLT 356   Cardiac Enzymes: No results found for this basename: CKTOTAL:5,CKMB:5,CKMBINDEX:5,TROPONINI:5 in the last 168 hours  BNP (last 3 results) No results found for this basename: PROBNP:3 in the last 8760 hours CBG:  Lab 09/16/11 2127 09/16/11 1933 09/16/11 1824 09/16/11 1718 09/16/11 1606  GLUCAP 123* 199* 214* 252* 235*    Radiological Exams on Admission: Dg Chest Portable 1 View  09/16/2011  *RADIOLOGY REPORT*  Clinical Data: Hyperglycemia.  Chest congestion.  PORTABLE CHEST - 1 VIEW 09/16/2011 1340 hours:  Comparison: Two-view chest x-ray 10/23/2008 [facility].  Findings: Cardiac silhouette normal and mediastinal contours unremarkable for the AP portable technique.  Lungs clear. Pulmonary vascularity normal.  Bronchovascular markings normal.  No pneumothorax.  No pleural effusions.  IMPRESSION: No acute cardiopulmonary disease.   Original Report Authenticated By: Deniece Portela, M.D.     Assessment/Plan Principal Problem:  *DKA, type 1, not at goal Active Problems:  Dehydration  Hyponatremia  Leukocytosis  Bronchitis, acute  Influenza-like illness  Tobacco abuse  Medical non-compliance   1. Diabetic ketoacidosis in patient with poorly controlled DM 1: This was probably precipitated by acute illness. Admitted to step down. Placed on DKA protocol-aggressive IV fluids, insulin drip, close monitoring of BMP. We'll check hemoglobin A1c. Once her anion gap is closed and bicarbonate is normal, transition to Lantus and sliding scale insulin. We'll consult diabetes coordinator. Counseled patient at length regarding importance of compliance with all aspects of diabetes  management including diet, regular CBG monitoring, medications and M.D. followup. She verbalized understanding. 2. Dehydration: Secondary to problem #1. Continue IV fluids. 3. Hyponatremia: Componant of dehydration and pseudohyponatremia from hyperglycemia. Management as above. Monitor BMP frequently. 4. Influenza-like illness with acute viral bronchitis,? Complicated by secondary bacterial infection: Patient has received IV Rocephin which we will continue and add IV azithromycin for atypical infections. Although it is early for flu season, to be sure, we'll place on droplet isolation until influenza panel is checked and ruled out. Discontinue prednisone since it's complicating her DM and currently without any bronchospasm. 5. Tobacco abuse: Cessation counseled. Patient declines a nicotine patch. 6. Leukocytosis: Possibly from acute illness i.e DKA, bronchitis and steroids. Patient does not have urinary symptoms suggestive of UTI. Monitor daily CBCs. Continue antibiotics as per discussion in problem #4. 7. Anxiety and depression: Patient indicates that she uses Xanax occasionally and has not used Cymbalta for months.  Code Status: Full Family Communication: At the patient's request, discussed her care in detail, answered questions and updated care with her spouse and mother at the bedside. Disposition Plan: Home when stable.  Time spent: 70 minutes  [facility] Pager [phone]  If 7PM-7AM, please contact night-coverage [email or web address] Password [facility] 09/16/2011, 10:04 PM

## 2011-09-16 NOTE — ED Notes (Signed)
The patient's CBG was 245.

## 2011-09-16 NOTE — ED Provider Notes (Signed)
History     CSN: [id number]  Arrival date & time 09/16/11  1259   First MD Initiated Contact with Patient 09/16/11 1313      Chief Complaint  Patient presents with  . Hyperglycemia    (Consider location/radiation/quality/duration/timing/severity/associated sxs/prior treatment) HPI  Patient complaining of hyperglycemia, body aches,.  Patient with 26 y.o. With uri then patient began to have nasal congestion, sneezing, coughing, runny nose Tuesday or Wenesday.  [facility] and started on Augmentin and prednisone for sinusitis, no blood sugar checked.  Patient supposed to be on insulin pump at basal rate of 0.5 units of humalog/ hour and ss but patient noncompliant with pump not having used pump for several weeks, or sq insulin for unknown time.  Took one shot today.  Patient feels like heart is pounding but denies cough or dyspnea, vomiting, diarrhea, uti symptoms, vaginal discharge.  Patient with some nausea today.  She has been drinking fluids but appetite for solids is poor. Currently menstruating.  G2A1p1.  NO birth control. pmd- Ernie [facility].  Past Medical History  Diagnosis Date  . Diabetes mellitus   . Anxiety   . Skin cancer     Past Surgical History  Procedure Date  . Other surgical history     cyst removed from face  . Back surgery     No family history on file.  History  Substance Use Topics  . Smoking status: Current Every Day Smoker  . Smokeless tobacco: Not on file  . Alcohol Use: No    OB History    Grav Para Term Preterm Abortions TAB SAB Ect Mult Living                  Review of Systems  Constitutional: Positive for appetite change. Negative for fever, chills, activity change and unexpected weight change.  HENT: Negative for sore throat, rhinorrhea, neck pain, neck stiffness and sinus pressure.   Eyes: Negative for visual disturbance.  Respiratory: Negative for cough and shortness of breath.     Cardiovascular: Negative for chest pain and leg swelling.  Gastrointestinal: Negative for vomiting, abdominal pain, diarrhea and blood in stool.  Genitourinary: Negative for dysuria, urgency, frequency, vaginal discharge and difficulty urinating.  Musculoskeletal: Negative for myalgias, arthralgias and gait problem.  Skin: Negative for color change and rash.  Neurological: Negative for weakness, light-headedness and headaches.  Hematological: Does not bruise/bleed easily.  Psychiatric/Behavioral: Negative for dysphoric mood.    Allergies  Ivp dye and Adhesive  Home Medications   Current Outpatient Rx  Name Route Sig Dispense Refill  . INSULIN LISPRO (HUMAN) 100 UNIT/ML [place] SOLN Subcutaneous Inject into the skin 3 (three) times daily before meals.    . ALPRAZOLAM 0.25 MG PO TABS Oral Take 0.25 mg by mouth daily as needed. For anxiety    . DULOXETINE HCL 30 MG PO CPEP Oral Take 30 mg by mouth daily. Take with 60mg  to equal 90mg  dose     . DULOXETINE HCL 60 MG PO CPEP Oral Take 60 mg by mouth daily. Take with 30mg  to equal 90mg  dose    . VITAMIN C 500 MG PO TABS Oral Take 500 mg by mouth daily.        BP 144/90  Pulse 110  Temp 97.6 F (36.4 C) (Oral)  Resp 20  SpO2 100%  Physical Exam  Nursing note and vitals reviewed. Constitutional: She appears well-developed and well-nourished.  HENT:  Head: Normocephalic  and atraumatic.  Eyes: Conjunctivae normal and EOM are normal. Pupils are equal, round, and reactive to light.  Neck: Normal range of motion. Neck supple.  Cardiovascular: Regular rhythm, normal heart sounds and intact distal pulses.  Tachycardia present.   Pulmonary/Chest: Effort normal and breath sounds normal.  Abdominal: Soft. Bowel sounds are normal.  Musculoskeletal: Normal range of motion.  Neurological: She is alert.  Skin: Skin is warm and dry.  Psychiatric: She has a normal mood and affect. Thought content normal.    ED Course  Procedures (including  critical care time)  Labs Reviewed  GLUCOSE, CAPILLARY - Abnormal; Notable for the following:    Glucose-Capillary 460 (*)     All other components within normal limits   No results found.   No diagnosis found.    Patient on dka protocol with glucose stabilizer and drip in place. Initial pH 7.02 .  Patient with increased wbc with left shift and prodrome of body aches.  Urine cath ordered but clean catch obtained, Rocephin ordered on basis of cath urine.  Patient awake and alert with hr 110 and bp 120/70.   CRITICAL CARE Performed by: Shaune Pollack   Total critical care time: 45  Critical care time was exclusive of separately billable procedures and treating other patients.  Critical care was necessary to treat or prevent imminent or life-threatening deterioration.  Critical care was time spent personally by me on the following activities: development of treatment plan with patient and/or surrogate as well as nursing, discussions with consultants, evaluation of patient's response to treatment, examination of patient, obtaining history from patient or surrogate, ordering and performing treatments and interventions, ordering and review of laboratory studies, ordering and review of radiographic studies, pulse oximetry and re-evaluation of patient's condition.   Patient's care discussed with Dr. Darrick Meigs and patient to have second bnp drawn.  Patient to be transferred to [facility] and signed out to Dr. Florene Glen.     Shaune Pollack, MD 09/16/11 [phone]

## 2011-09-16 NOTE — ED Notes (Signed)
D51/2NS will remain at 75 ml/hr per v.o. Dr. Prince Rome

## 2011-09-17 DIAGNOSIS — E1110 Type 2 diabetes mellitus with ketoacidosis without coma: Secondary | ICD-10-CM

## 2011-09-17 DIAGNOSIS — J209 Acute bronchitis, unspecified: Secondary | ICD-10-CM

## 2011-09-17 DIAGNOSIS — F17200 Nicotine dependence, unspecified, uncomplicated: Secondary | ICD-10-CM

## 2011-09-17 LAB — BASIC METABOLIC PANEL
BUN: 12 mg/dL (ref 6–23)
BUN: 12 mg/dL (ref 6–23)
BUN: 8 mg/dL (ref 6–23)
BUN: 9 mg/dL (ref 6–23)
CO2: 14 mEq/L — ABNORMAL LOW (ref 19–32)
CO2: 16 mEq/L — ABNORMAL LOW (ref 19–32)
CO2: 16 mEq/L — ABNORMAL LOW (ref 19–32)
CO2: 20 mEq/L (ref 19–32)
Calcium: 7.3 mg/dL — ABNORMAL LOW (ref 8.4–10.5)
Calcium: 7.6 mg/dL — ABNORMAL LOW (ref 8.4–10.5)
Calcium: 7.7 mg/dL — ABNORMAL LOW (ref 8.4–10.5)
Chloride: 110 mEq/L (ref 96–112)
Chloride: 108 mEq/L (ref 96–112)
Creatinine, Ser: 0.29 mg/dL — ABNORMAL LOW (ref 0.50–1.10)
Creatinine, Ser: 0.31 mg/dL — ABNORMAL LOW (ref 0.50–1.10)
Creatinine, Ser: 0.35 mg/dL — ABNORMAL LOW (ref 0.50–1.10)
Creatinine, Ser: 0.62 mg/dL (ref 0.50–1.10)
GFR calc Af Amer: >90 mL/min (ref >90)
GFR calc Af Amer: >90 mL/min (ref >90)
GFR calc Af Amer: >90 mL/min (ref >90)
GFR calc non Af Amer: >90 mL/min (ref >90)
GFR calc non Af Amer: >90 mL/min (ref >90)
GFR calc non Af Amer: >90 mL/min (ref >90)
Glucose, Bld: 172 mg/dL — ABNORMAL HIGH (ref 70–99)
Glucose, Bld: 228 mg/dL — ABNORMAL HIGH (ref 70–99)
Potassium: 3.1 mEq/L — ABNORMAL LOW (ref 3.5–5.1)
Sodium: 137 mEq/L (ref 135–145)

## 2011-09-17 LAB — GLUCOSE, CAPILLARY
Glucose-Capillary: 129 mg/dL — ABNORMAL HIGH (ref 70–99)
Glucose-Capillary: 137 mg/dL — ABNORMAL HIGH (ref 70–99)
Glucose-Capillary: 154 mg/dL — ABNORMAL HIGH (ref 70–99)
Glucose-Capillary: 236 mg/dL — ABNORMAL HIGH (ref 70–99)
Glucose-Capillary: 201 mg/dL — ABNORMAL HIGH (ref 70–99)

## 2011-09-17 LAB — CBC
MCV: 80.2 fL (ref 78.0–100.0)
Platelets: 221 10*3/uL (ref 150–400)
RDW: 12.3 % (ref 11.5–15.5)
WBC: 13.6 10*3/uL — ABNORMAL HIGH (ref 4.0–10.5)

## 2011-09-17 LAB — HEMOGLOBIN A1C
Hgb A1c MFr Bld: 13.8 % — ABNORMAL HIGH (ref <5.7)
Mean Plasma Glucose: 349 mg/dL — ABNORMAL HIGH (ref <117)

## 2011-09-17 MED ORDER — INSULIN ASPART 100 UNIT/ML ~~LOC~~ SOLN
4.0000 [IU] | Freq: Three times a day (TID) | SUBCUTANEOUS | Status: DC
  Administered 2011-09-17 (×2): 4 [IU] via SUBCUTANEOUS

## 2011-09-17 MED ORDER — INFLUENZA VIRUS VACC SPLIT PF IM SUSP
0.5000 mL | INTRAMUSCULAR | Status: DC
  Filled 2011-09-17: qty 0.5

## 2011-09-17 MED ORDER — INSULIN ASPART 100 UNIT/ML ~~LOC~~ SOLN
0.0000 [IU] | Freq: Three times a day (TID) | SUBCUTANEOUS | Status: DC
Start: 2011-09-17 — End: 2011-09-19
  Administered 2011-09-17: 5 [IU] via SUBCUTANEOUS
  Administered 2011-09-17: 8 [IU] via SUBCUTANEOUS
  Administered 2011-09-18: 2 [IU] via SUBCUTANEOUS

## 2011-09-17 MED ORDER — INSULIN GLARGINE 100 UNIT/ML ~~LOC~~ SOLN: 10.0000 [IU] | Freq: Every day | SUBCUTANEOUS | Status: DC

## 2011-09-17 MED ORDER — SODIUM CHLORIDE 0.9 % IV SOLN
INTRAVENOUS | Status: DC
  Administered 2011-09-18: 04:00:00 via INTRAVENOUS

## 2011-09-17 MED ORDER — INSULIN GLARGINE 100 UNIT/ML ~~LOC~~ SOLN
20.0000 [IU] | Freq: Every day | SUBCUTANEOUS | Status: DC
  Administered 2011-09-17: 20 [IU] via SUBCUTANEOUS

## 2011-09-17 MED ORDER — INSULIN ASPART 100 UNIT/ML ~~LOC~~ SOLN: 6.0000 [IU] | Freq: Three times a day (TID) | SUBCUTANEOUS | Status: DC

## 2011-09-17 MED ORDER — POTASSIUM CHLORIDE CRYS ER 20 MEQ PO TBCR
40.0000 meq | EXTENDED_RELEASE_TABLET | Freq: Once | ORAL | Status: AC
  Administered 2011-09-17: 40 meq via ORAL
  Filled 2011-09-17: qty 2

## 2011-09-17 MED ORDER — INSULIN GLARGINE 100 UNIT/ML ~~LOC~~ SOLN
14.0000 [IU] | Freq: Once | SUBCUTANEOUS | Status: AC
  Administered 2011-09-17: 14 [IU] via SUBCUTANEOUS

## 2011-09-17 MED ORDER — INSULIN ASPART 100 UNIT/ML ~~LOC~~ SOLN
0.0000 [IU] | Freq: Every day | SUBCUTANEOUS | Status: DC
  Administered 2011-09-17: 2 [IU] via SUBCUTANEOUS

## 2011-09-17 MED ORDER — POTASSIUM CHLORIDE CRYS ER 20 MEQ PO TBCR
40.0000 meq | EXTENDED_RELEASE_TABLET | Freq: Two times a day (BID) | ORAL | Status: DC
  Administered 2011-09-17 – 2011-09-18 (×3): 40 meq via ORAL
  Filled 2011-09-17 (×5): qty 2

## 2011-09-17 NOTE — Progress Notes (Signed)
Utilization review completed.  

## 2011-09-17 NOTE — Progress Notes (Addendum)
Inpatient Diabetes Program Recommendations  AACE/ADA: New Consensus Statement on Inpatient Glycemic Control (2013)  Target Ranges:  Prepandial:   less than 140 mg/dL      Peak postprandial:   less than 180 mg/dL (1-2 hours)      Critically ill patients:  140 - 180 mg/dL   Reason for Visit: MD consult  Note: Patient admitted in DKA.  Patient states that she wants to care for her diabetes, but it is a "hassle".  States that she thinks her best shot is to get back on her insulin pump because she only has to change the set every 3 days.  She does not think she would ever be able to take time to take four injections a day.  When she goes on her insulin pump she tries to do everything she needs to do, but for example, when it is time to change her set she wants to wait until she is going to shower.  Then after her shower she is interrupted and doesn't get new set in place.  More time goes by and before she realizes it, she has been without insulin.  When asked about being depressed, she says that she is sure she is.  Was put on Cymbalta, but took herself off of it because even with insurance it costs $50 per month.  Currently followed by [facility].  Her psychiatrist is Lattie Haw Poullas (sp?).  Last saw psychiatrist about 2 months ago.  Last saw her therapist about one month ago.  Her PCP is Dr. Rachell Cipro.  Not sure which doctor started her on an insulin pump, but thinks it was Dr. Forde Dandy- about 10 years.  States that she thinks her focus needs to be how to get back on her insulin pump and stick with it.  Mentioned early in my visit that she has no problem with access to insulin, etc presently, but soon with lose health insurance.  Every time during conversation insurance is mentioned and future loss of it, she becomes tearful.  Finally told me that she has insurance through her husband in the [employer], however they have been separated for the last 3 years.  After their  divorce (doesn't know when) she will lose health care coverage in 30 days.  Consult to Care Management for assessment of any options available to her.  Notified by nurse that her parents would like to speak with me.  Father came almost immediately.  He states that she needs to "set an alarm to remind herself to check sugar when she is supposed to, take insulin, and eat, etc".  States that he is willing to assist in any way he can, but she needs to tell him when she needs help, and what kind of help.  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  Patient's father and mother are divorced.  Gave father the on-call pager number for the Inpatient Diabetes Program should he need to speak with a Diabetes Coordinator.    In my opinion, patient's psyco-social barriers to managing her diabetes are significant even while she has health insurance and will only worsen when her health insurance ceases.  Ammiel Guiney S. Amelda Hapke, RN, CNS, CDE  [phone])        Patient has type 1 diabetes.  Has Lantus and Correction scale insulin ordered.  Request MD consider adding meal coverage.  Thank you.

## 2011-09-17 NOTE — Clinical Social Work Psychosocial (Signed)
     Clinical Social Work Department BRIEF PSYCHOSOCIAL ASSESSMENT 09/17/2011  Patient:  Tara Bullock, Tara Bullock     Account Number:  [id number]     Admit date:  09/16/2011  Clinical Social Worker:  Otilio Saber  Date/Time:  09/17/2011 03:06 PM  Referred by:  Care Management  Date Referred:  09/17/2011 Referred for  Other - See comment   Other Referral:   Resources for counseling services for non-insured   Interview type:  Patient Other interview type:   husband    PSYCHOSOCIAL DATA Living Status:  HUSBAND Admitted from facility:   Level of care:   Primary support name:   Primary support relationship to patient:   Degree of support available:    CURRENT CONCERNS Current Concerns  Other - See comment   Other Concerns:   Patient was concerned about continuing therapy once her insurance is discontinued. Patient was also concerned about medication assistance once she no longer has insurance. Patient also inquired about Medicaid application.    SOCIAL WORK ASSESSMENT / PLAN Clinical Social Worker received referral from CM about patient's concern regarding continuation of counseling services once her insurance discontinues. Patient's husband was at bedside. Patient reported that she is getting a divorce and does not know exactly when he insurance will be discontinue. 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 Patient reported that she currently goes to [facility], but has not been in a month, due to conflicting schedules with psychiatrist. CSW provided patient with resources for psychiatric counseling for non-insured/self pay and supportive community resources. CSW inquired about additional concerns and husband is interested in [place] as he reported that he and the patient have a child together and he will be coming out of the [employer] in a year. Once the patient leaves the military he reported that his child will not have insurance and is  interested in speaking with financial counselor regarding medicaid application. CM will be assisting with medication concern.   Assessment/plan status:  Referral to [employer] Other assessment/ plan:   Information/referral to community resources:   Outpatient Mental health providers  Supportive and [facility] Resources  Outpatient psychiatry and counseling  Called financial counseling    PATIENTS/FAMILYS RESPONSE TO PLAN OF CARE: Patient and husband were appreciative of CSW's visit and concern. Both were appreciative of resources given.

## 2011-09-17 NOTE — Progress Notes (Signed)
[facility] Progress Note [place] TEAM 1 - Stepdown/ICU TEAM   Tara Bullock [id number] DOB: May 01, 1985 DOA: 09/16/2011 PCP: Rachell Cipro, MD  Brief narrative: 26 year old female patient who has type 1 diabetes. Has been prescribed an insulin pump but has not been utilizing this device consistently. The patient endorsed a sensation of being sick for at least one week with upper respiratory symptoms that consisted of cough congestion and fever up to 101 with productive sputum. Her 26-year-old daughter had similar symptoms. 4 days prior to admission patient was admitted to an urgent care where she was given oral prednisone and Augmentin for diagnosis of sinusitis and viral upper respiratory infection. During this same time patient was not utilizing any insulin and was not checking her CBGs. Her upper respiratory symptoms improved but she continued with severe malaise. She finally decided to check a blood glucose reading which was 381 at which point she gave herself an insulin injection and decided to come to the emergency department for evaluation. In the emergency department she was found to be in DKA, glucose was 444 with an anion gap of 28 and venous blood gas pH was 7.030. In addition she had a white blood cell count of 18,700. Urinalysis was consistent with either dehydration or urinary tract infection and her chest x-ray was negative for any pneumonia or heart failure. She appeared quite dehydrated and was given multiple fluid challenges and started on an insulin drip. The ER gave her Rocephin for presumed urinary tract infection and she was subsequent directly admitted to the step down unit for further evaluation and management of acute DKA.  Assessment/Plan:  DKA, type 1, not at goal *Anion gap is closed  *Will discontinue IV insulin infusion in favor of Lantus insulin *Have asked diabetes educator to evaluate patient *Patient endorses she has "problems getting an  appointment" with her family doctor *In addition she reports she is a single parent despite being married (husband lives in [place]) and reports she is "too busy" with her 26-year-old to pay attention to her diabetic and insulin regimens *Suspect recent prednisone taper contributed to hyperglycemia  Dehydration/ Hyponatremia (Pseudo) due to DKA *Continue IV fluid *Sodium has normalized with correction of severe hyperglycemia  Hypokalemia *Replete orally *Follow lytes  Bronchitis, acute/ Influenza-like illness *Patient is now rehydrated and has increased in upper respiratory symptoms and recurrence of cough; suspect cough likely suppressed at home due to dehydration from persistent hyperglycemia *Influenza panel negative *Continue supportive care *Continue to treat as acute infectious bronchitis in this chronic smoker  Leukocytosis *Multifactorial secondary to dehydration and bronchitis as well as DKA  Tobacco abuse *Attending physician instructed patient on necessity for tobacco cessation especially in patient with type 1 diabetes  Medical non-compliance *Patient endorses has not consistently used insulin pump in at least 1-1/2 months and likely will not be an appropriate candidate to continue this form of diabetic management after discharge *May need social worker or case management assistance to make sure patient has appropriate social resources to assist her with her young child. In addition may need to be set up with home health diabetes nurse or set up with outpatient diabetes clinic. *May also benefit from closer followup by establishing with endocrinologist-note patient endorses she has insurance  DVT prophylaxis: Continue Lovenox subcutaneous 40 mg daily Code Status: Full  Family Communication: Discussed concerns with patient directly Disposition Plan: Transfer to floor  Consultants: Diabetes educator  Procedures: None  Antibiotics: Rocephin 9/15 >>> Zithromax 9/15  >>>  HPI/Subjective:  Patient alert and complaining of paroxysmal cough. Denies chest pain or shortness of breath. Clarified preadmission diabetes management.   Objective: Blood pressure 136/80, pulse 92, temperature 98.4 F (36.9 C), temperature source Oral, resp. rate 14, height 5\' 1"  (1.549 m), weight 49.5 kg (109 lb 2 oz), last menstrual period 09/16/2011, SpO2 100.00%.  Intake/Output Summary (Last 24 hours) at 09/17/11 1815 Last data filed at 09/17/11 1006  Gross per 24 hour  Intake 2552.8 ml  Output   1500 ml  Net 1052.8 ml     Exam: General: No acute respiratory distress, pale Lungs: Clear to auscultation bilaterally without wheezes or crackles, room air Cardiovascular: Regular rate and rhythm without murmur gallop or rub normal S1 and S2 Abdomen: Nontender, nondistended, soft, bowel sounds positive, no rebound, no ascites, no appreciable mass Musculoskeletal: No significant cyanosis, clubbing of extremities Neurological: Alert and oriented x3, moves all extremities x4, exam non-focal  Data Reviewed: Basic Metabolic Panel:  Lab [id number] 09/17/11 1025 09/17/11 0553 09/17/11 0158 09/16/11 2330  NA 137 137 136 133* 137  K 3.1* 3.0* 3.3* 3.1* 3.1*  CL 109 112 110 110 111  CO2 20 16* 16* 14* 13*  GLUCOSE 228* 172* 157* 194* 122*  BUN 9 6 8 10 12   CREATININE 0.44* 0.29* 0.29* 0.35* 0.31*  CALCIUM 8.4 7.7* 7.7* 7.3* 7.6*  MG -- -- -- -- --  PHOS -- -- -- -- --   Liver Function Tests:  Lab 09/16/11 1310  AST 10  ALT 12  ALKPHOS 136*  BILITOT 0.2*  PROT 7.7  ALBUMIN 3.5   CBC:  Lab 09/17/11 0158 09/16/11 1310  WBC 13.6* 18.7*  NEUTROABS -- 14.8*  HGB 11.5* 14.6  HCT 33.6* 43.8  MCV 80.2 83.1  PLT 221 356   CBG:  Lab 09/17/11 1619 09/17/11 1223 09/17/11 1111 09/17/11 0955 09/17/11 0851  GLUCAP 280* 236* 196* 154* 139*    Recent Results (from the past 240 hour(s))  MRSA PCR SCREENING     Status: Normal   Collection Time   09/17/11 12:00 AM       Component Value Range Status Comment   MRSA by PCR NEGATIVE  NEGATIVE Final      Studies:  Recent x-ray studies have been reviewed in detail by the Attending Physician  Scheduled Meds:  Reviewed in detail by the Attending Physician   Erin Hearing, ANP [facility] Office  [phone] Pager [phone]  On-Call/Text Page:      [email or web address]      password [email or web address]  If 7PM-7AM, please contact night-coverage [email or web address] Password [email or web address] 09/17/2011, 6:15 PM   LOS: 1 day   I have personally examined this patient and reviewed the entire database. I have reviewed the above note, made any necessary editorial changes, and agree with its content.  Cherene Altes, MD [facility]

## 2011-09-18 LAB — BASIC METABOLIC PANEL
CO2: 23 mEq/L (ref 19–32)
Calcium: 8.5 mg/dL (ref 8.4–10.5)
Chloride: 112 mEq/L (ref 96–112)
Potassium: 3.2 mEq/L — ABNORMAL LOW (ref 3.5–5.1)
Sodium: 141 mEq/L (ref 135–145)

## 2011-09-18 LAB — GLUCOSE, CAPILLARY: Glucose-Capillary: 141 mg/dL — ABNORMAL HIGH (ref 70–99)

## 2011-09-18 LAB — URINE CULTURE

## 2011-09-18 MED ORDER — LEVOFLOXACIN 500 MG PO TABS: 500.0000 mg | ORAL_TABLET | Freq: Every day | ORAL | Status: DC

## 2011-09-18 MED ORDER — AZITHROMYCIN 500 MG PO TABS
500.0000 mg | ORAL_TABLET | Freq: Every day | ORAL | Status: DC
  Filled 2011-09-18: qty 1

## 2011-09-18 MED ORDER — INSULIN PUMP
Freq: Three times a day (TID) | SUBCUTANEOUS | Status: DC
  Filled 2011-09-18: qty 1

## 2011-09-18 MED ORDER — ALBUTEROL SULFATE HFA 108 (90 BASE) MCG/ACT IN AERS: 2.0000 | INHALATION_SPRAY | Freq: Four times a day (QID) | RESPIRATORY_TRACT | Status: DC | PRN

## 2011-09-18 NOTE — Progress Notes (Signed)
Patient discharged in stable condition via wheelchair. Discharge instructions, prescriptions and work note were given and explained.

## 2011-09-18 NOTE — Progress Notes (Addendum)
Inpatient Diabetes Program Recommendations  AACE/ADA: New Consensus Statement on Inpatient Glycemic Control (2013)  Target Ranges:  Prepandial:   less than 140 mg/dL      Peak postprandial:   less than 180 mg/dL (1-2 hours)      Critically ill patients:  140 - 180 mg/dL   Reason for Visit: Insulin pump use  Note: Patient is now on insulin pump.  Basal rates are as follows: 12 mn = 0.8 units/hr   4 pm = 0.875 units/hr 10 pm = 0.85 units/hr Total 24 hour basal insulin = 20.65 units  Bolus Wizard settings:  CHO ratio  1:7 from 12 mn to 8 am    1:9 from 8 am to 12 noon    1:10 from 12 noon to 4 pm   Sensitivity/Correction Factor 1:30   Target range 90 to 120 mg/dl  Discussed possible ways to improve adherence to insulin pump regimen.  Strongly encouraged patient to continue outpatient psycho-social appointments to address barriers to adequate diabetes self-care.  Discussed possible use of a stress-loop with tubing to help prevent set from being pulled out accidentally.  Verniece Encarnacion S. Taron Conrey, RN, CNS, CDE  [phone])     Patient discharged home.  Reminded patient that she received Lantus 14 units last night, which will possibly still be effective until tonight around 10 pm.  CBG's thus far are above target-- but she may need to use some caution and watch for hypoglycemia until Lantus has been metabolized.  Patient acknowledged she was aware of potential effect of Lantus.

## 2011-09-18 NOTE — Progress Notes (Signed)
CARE MANAGEMENT NOTE 09/18/2011  Patient:  Tara Bullock, Tara Bullock   Account Number:  [id number]  Date Initiated:  09/18/2011  Documentation initiated by:  Ricki Miller  Subjective/Objective Assessment:   26 yr old female admited for DKA.     Action/Plan:   CM spoke with patient regarding her concerns/needs. Social worker has provided community resources to patient.   Anticipated DC Date:  09/19/2011   Anticipated DC Plan:  [place]  CM consult      Choice offered to / List presented to:             Status of service:  Completed, signed off Medicare Important Message given?   (If response is "NO", the following Medicare IM given date fields will be blank) Date Medicare IM given:   Date Additional Medicare IM given:    Discharge Disposition:  HOME/SELF CARE  Per UR Regulation:    If discussed at Long Length of Stay Meetings, dates discussed:    Comments:

## 2011-09-18 NOTE — Discharge Summary (Signed)
Physician Discharge Summary  Tara Bullock [id number] DOB: 1985-12-05 DOA: 09/16/2011  PCP: Rachell Cipro, MD  Admit date: 09/16/2011 Discharge date: 09/18/2011  Discharge Diagnoses:  Principal Problem:  *DKA, type 1, not at goal Active Problems:  Dehydration  Hyponatremia (Pseudo) due to DKA  Leukocytosis  Bronchitis, acute  Influenza-like illness  Tobacco abuse  Medical non-compliance   Discharge Condition: good  Diet recommendation: diabetic  Filed Weights   09/16/11 2057  Weight: 49.5 kg (109 lb 2 oz)    History of present illness:  26 yo woman admitted with DKA.due to non compliance   Hospital Course:  DKA,  *Anion gap closed with iv insulin and iv fluids.  *Suspect recent prednisone taper contributed to worse hyperglycemia . Patient resumed insulin pump in house and demonstrated a good knowledge on how to use it.  Hypokalemia  *Repleted orally  Bronchitis, acute/ Influenza-like illness   *Influenza panel was negative   *Continue to treat as acute infectious bronchitis in this chronic smoker with antibiotic. Prescribed levaquin at dc  Also prescribed an albuterol inhaler    Procedures:  none  Consultations:  none  Discharge Exam: Filed Vitals:   09/17/11 1239 09/17/11 1242 09/17/11 2214 09/18/11 0500  BP:  136/80 122/81 126/76  Pulse:  92 84 80  Temp: 98.4 F (36.9 C)  98.6 F (37 C) 99.5 F (37.5 C)  TempSrc: Oral     Resp:  14 18   Height:      Weight:      SpO2:  100% 99% 99%    General: axox3 Cardiovascular: rrr Respiratory: ctab  Discharge Instructions  Discharge Orders    Future Orders Please Complete By Expires   Diet Carb Modified      Increase activity slowly          Medication List     As of 09/18/2011  2:36 PM    STOP taking these medications         amoxicillin-clavulanate 875-125 MG per tablet   Commonly known as: AUGMENTIN      predniSONE 10 MG tablet   Commonly known as: DELTASONE      TAKE  these medications         albuterol 108 (90 BASE) MCG/ACT inhaler   Commonly known as: PROVENTIL HFA;VENTOLIN HFA   Inhale 2 puffs into the lungs every 6 (six) hours as needed for wheezing.      ALPRAZolam 0.25 MG tablet   Commonly known as: XANAX   Take 0.25 mg by mouth daily as needed. For anxiety      DULoxetine 60 MG capsule   Commonly known as: CYMBALTA   Take 60 mg by mouth daily. Take with 30mg  to equal 90mg  dose      insulin lispro 100 UNIT/ML injection   Commonly known as: HUMALOG   Inject 8 Units into the skin 3 (three) times daily before meals.      levofloxacin 500 MG tablet   Commonly known as: LEVAQUIN   Take 1 tablet (500 mg total) by mouth daily.           Follow-up Information    Schedule an appointment as soon as possible for a visit with [facility], MD.   Contact information:   [place], [place] [address] [phone]           The results of significant diagnostics from this hospitalization (including imaging, microbiology, ancillary and laboratory) are listed below for reference.  Significant Diagnostic Studies: Dg Chest Portable 1 View  09/16/2011  *RADIOLOGY REPORT*  Clinical Data: Hyperglycemia.  Chest congestion.  PORTABLE CHEST - 1 VIEW 09/16/2011 1340 hours:  Comparison: Two-view chest x-ray 10/23/2008 [facility].  Findings: Cardiac silhouette normal and mediastinal contours unremarkable for the AP portable technique.  Lungs clear. Pulmonary vascularity normal.  Bronchovascular markings normal.  No pneumothorax.  No pleural effusions.  IMPRESSION: No acute cardiopulmonary disease.   Original Report Authenticated By: Deniece Portela, M.D.     Microbiology: Recent Results (from the past 240 hour(s))  URINE CULTURE     Status: Normal   Collection Time   09/16/11  3:19 PM      Component Value Range Status Comment   Specimen Description URINE, CATHETERIZED   Final    Special Requests NONE   Final     Culture  Setup Time 09/17/2011 13:11   Final    Colony Count NO GROWTH   Final    Culture NO GROWTH   Final    Report Status 09/18/2011 FINAL   Final   MRSA PCR SCREENING     Status: Normal   Collection Time   09/17/11 12:00 AM      Component Value Range Status Comment   MRSA by PCR NEGATIVE  NEGATIVE Final      Labs: Basic Metabolic Panel:  Lab [id number] 09/17/11 1729 09/17/11 1434 09/17/11 1025 09/17/11 0553  NA 141 137 137 137 136  K 3.2* 3.6 3.1* 3.0* 3.3*  CL 112 108 109 112 110  CO2 23 20 20  16* 16*  GLUCOSE 135* 323* 228* 172* 157*  BUN 8 12 9 6 8   CREATININE 0.32* 0.62 0.44* 0.29* 0.29*  CALCIUM 8.5 8.5 8.4 7.7* 7.7*  MG -- -- -- -- --  PHOS -- -- -- -- --   Liver Function Tests:  Lab 09/16/11 1310  AST 10  ALT 12  ALKPHOS 136*  BILITOT 0.2*  PROT 7.7  ALBUMIN 3.5   No results found for this basename: LIPASE:5,AMYLASE:5 in the last 168 hours No results found for this basename: AMMONIA:5 in the last 168 hours CBC:  Lab 09/17/11 0158 09/16/11 1310  WBC 13.6* 18.7*  NEUTROABS -- 14.8*  HGB 11.5* 14.6  HCT 33.6* 43.8  MCV 80.2 83.1  PLT 221 356   Cardiac Enzymes: No results found for this basename: CKTOTAL:5,CKMB:5,CKMBINDEX:5,TROPONINI:5 in the last 168 hours BNP: BNP (last 3 results) No results found for this basename: PROBNP:3 in the last 8760 hours CBG:  Lab 09/18/11 1124 09/18/11 0700 09/17/11 2048 09/17/11 1619 09/17/11 1223  GLUCAP 242* 141* 201* 280* 236*    Time coordinating discharge: 26minutes  Signed:  Kaydence Menard  [facility] 09/18/2011, 2:36 PM

## 2011-09-18 NOTE — Progress Notes (Signed)
Patient is refusing to receive novolog injections. Insists on using insulin pump. MD was notified and advised that she could use her insulin pump.

## 2012-03-30 ENCOUNTER — Emergency Department (HOSPITAL_COMMUNITY)
Admission: EM | Admit: 2012-03-30 | Discharge: 2012-03-30 | Disposition: A | Discharge status: Home Health | Attending: Emergency Medicine | Admitting: Emergency Medicine

## 2012-03-30 ENCOUNTER — Encounter (HOSPITAL_COMMUNITY): Payer: Self-pay | Admitting: *Deleted

## 2012-03-30 DIAGNOSIS — F411 Generalized anxiety disorder: Secondary | ICD-10-CM | POA: Insufficient documentation

## 2012-03-30 DIAGNOSIS — Z85828 Personal history of other malignant neoplasm of skin: Secondary | ICD-10-CM | POA: Insufficient documentation

## 2012-03-30 DIAGNOSIS — R52 Pain, unspecified: Secondary | ICD-10-CM | POA: Insufficient documentation

## 2012-03-30 DIAGNOSIS — E1169 Type 2 diabetes mellitus with other specified complication: Secondary | ICD-10-CM | POA: Insufficient documentation

## 2012-03-30 DIAGNOSIS — R739 Hyperglycemia, unspecified: Secondary | ICD-10-CM

## 2012-03-30 DIAGNOSIS — R110 Nausea: Secondary | ICD-10-CM | POA: Insufficient documentation

## 2012-03-30 DIAGNOSIS — Z79899 Other long term (current) drug therapy: Secondary | ICD-10-CM | POA: Insufficient documentation

## 2012-03-30 DIAGNOSIS — F17200 Nicotine dependence, unspecified, uncomplicated: Secondary | ICD-10-CM | POA: Insufficient documentation

## 2012-03-30 LAB — CBC WITH DIFFERENTIAL/PLATELET
Basophils Relative: 0 % (ref 0–1)
HCT: 37.5 % (ref 36.0–46.0)
Hemoglobin: 12.7 g/dL (ref 12.0–15.0)
Lymphs Abs: 1 10*3/uL (ref 0.7–4.0)
MCHC: 33.9 g/dL (ref 30.0–36.0)
Monocytes Absolute: 0.9 10*3/uL (ref 0.1–1.0)
Monocytes Relative: 9 % (ref 3–12)
Neutro Abs: 7.9 10*3/uL — ABNORMAL HIGH (ref 1.7–7.7)
RBC: 4.57 MIL/uL (ref 3.87–5.11)

## 2012-03-30 LAB — POCT I-STAT 3, VENOUS BLOOD GAS (G3P V)
pCO2, Ven: 43.2 mmHg — ABNORMAL LOW (ref 45.0–50.0)
pH, Ven: 7.389 — ABNORMAL HIGH (ref 7.250–7.300)

## 2012-03-30 LAB — COMPREHENSIVE METABOLIC PANEL
Albumin: 2.8 g/dL — ABNORMAL LOW (ref 3.5–5.2)
Alkaline Phosphatase: 84 U/L (ref 39–117)
BUN: 14 mg/dL (ref 6–23)
CO2: 25 mEq/L (ref 19–32)
Chloride: 100 mEq/L (ref 96–112)
Creatinine, Ser: 0.48 mg/dL — ABNORMAL LOW (ref 0.50–1.10)
GFR calc Af Amer: >90 mL/min (ref >90)
GFR calc non Af Amer: >90 mL/min (ref >90)
Glucose, Bld: 191 mg/dL — ABNORMAL HIGH (ref 70–99)
Total Bilirubin: 0.2 mg/dL — ABNORMAL LOW (ref 0.3–1.2)

## 2012-03-30 LAB — POCT I-STAT, CHEM 8
Chloride: 101 mEq/L (ref 96–112)
Creatinine, Ser: 0.5 mg/dL (ref 0.50–1.10)
Glucose, Bld: 216 mg/dL — ABNORMAL HIGH (ref 70–99)
HCT: 44 % (ref 36.0–46.0)
Potassium: 4 mEq/L (ref 3.5–5.1)

## 2012-03-30 MED ORDER — ONDANSETRON 8 MG PO TBDP: 8.0000 mg | ORAL_TABLET | Freq: Three times a day (TID) | ORAL | Status: DC | PRN

## 2012-03-30 MED ORDER — ONDANSETRON 4 MG PO TBDP
8.0000 mg | ORAL_TABLET | Freq: Once | ORAL | Status: AC
  Administered 2012-03-30: 8 mg via ORAL
  Filled 2012-03-30: qty 2

## 2012-03-30 MED ORDER — IBUPROFEN 800 MG PO TABS
800.0000 mg | ORAL_TABLET | Freq: Once | ORAL | Status: AC
  Administered 2012-03-30: 800 mg via ORAL
  Filled 2012-03-30: qty 1

## 2012-03-30 NOTE — ED Provider Notes (Signed)
History     CSN: [id number]  Arrival date & time 03/30/12  [id number]   First MD Initiated Contact with Patient 03/30/12 [phone]      Chief Complaint  Patient presents with  . Blood Sugar Problem    (Consider location/radiation/quality/duration/timing/severity/associated sxs/prior treatment) HPI 27 year old female with history of diabetes presents to emergency room with complaint of widely fluctuating blood sugars, and the feeling that she may be in DKA.  Patient reports diffuse body aching and nausea.  No vomiting.  No diarrhea.  No fevers or chills.  No abscesses, no urinary symptoms, no cough.  Last in DKA this past fall.  Patient manages her diabetes via insulin pump.   No changes to the pump recently.  She currently has enough insulin to give herself.  Patient is followed by a local primary care Dr. group, reports that she doesn't have an official primary care Dr. only sees different partners within the group.  She has a referral to an endocrinologist pending.  Past Medical History  Diagnosis Date  . Diabetes mellitus   . Anxiety   . Skin cancer     Past Surgical History  Procedure Laterality Date  . Other surgical history      cyst removed from face  . Back surgery      Family History  Problem Relation Age of Onset  . Hypertension Mother   . Hypertension Father     History  Substance Use Topics  . Smoking status: Current Every Day Smoker -- 1.00 packs/day  . Smokeless tobacco: Not on file  . Alcohol Use: Yes     Comment: occasional    OB History   Grav Para Term Preterm Abortions TAB SAB Ect Mult Living                  Review of Systems  See History of Present Illness; otherwise all other systems are reviewed and negative Allergies  Ivp dye; Ciprofloxacin; and Adhesive  Home Medications   Current Outpatient Rx  Name  Route  Sig  Dispense  Refill  . ALPRAZolam (XANAX) 0.5 MG tablet   Oral   Take 0.5 mg by mouth daily as needed for sleep or anxiety.          . hyoscyamine (LEVSIN, ANASPAZ) 0.125 MG tablet   Oral   Take 0.125 mg by mouth every 4 (four) hours as needed for cramping.         . Insulin Human (INSULIN PUMP) 100 unit/ml SOLN   Subcutaneous   Inject 1 each into the skin continuous. Humalog insulin           BP 115/76  Pulse 103  Temp(Src) 98.3 F (36.8 C) (Oral)  Resp 12  SpO2 97%  LMP 03/19/2012  Physical Exam  Nursing note and vitals reviewed. Constitutional: She is oriented to person, place, and time. She appears well-developed and well-nourished.  HENT:  Head: Normocephalic and atraumatic.  Nose: Nose normal.  Mouth/Throat: Oropharynx is clear and moist.  Eyes: Conjunctivae and EOM are normal. Pupils are equal, round, and reactive to light.  Neck: Normal range of motion. Neck supple. No JVD present. No tracheal deviation present. No thyromegaly present.  Cardiovascular: Normal rate, regular rhythm, normal heart sounds and intact distal pulses.  Exam reveals no gallop and no friction rub.   No murmur heard. Pulmonary/Chest: Effort normal and breath sounds normal. No stridor. No respiratory distress. She has no wheezes. She has no rales. She exhibits no tenderness.  Abdominal: Soft. Bowel sounds are normal. She exhibits no distension and no mass. There is no tenderness. There is no rebound and no guarding.  Musculoskeletal: Normal range of motion. She exhibits no edema and no tenderness.  Lymphadenopathy:    She has no cervical adenopathy.  Neurological: She is alert and oriented to person, place, and time. She exhibits normal muscle tone. Coordination normal.  Skin: Skin is warm and dry. No rash noted. No erythema. No pallor.  Psychiatric: She has a normal mood and affect. Her behavior is normal. Judgment and thought content normal.    ED Course  Procedures (including critical care time)  Labs Reviewed  GLUCOSE, CAPILLARY - Abnormal; Notable for the following:    Glucose-Capillary 214 (*)    All other  components within normal limits  CBC WITH DIFFERENTIAL - Abnormal; Notable for the following:    Neutrophils Relative 80 (*)    Neutro Abs 7.9 (*)    Lymphocytes Relative 10 (*)    All other components within normal limits  COMPREHENSIVE METABOLIC PANEL - Abnormal; Notable for the following:    Glucose, Bld 191 (*)    Creatinine, Ser 0.48 (*)    Total Protein 5.7 (*)    Albumin 2.8 (*)    Total Bilirubin 0.2 (*)    All other components within normal limits  POCT I-STAT, CHEM 8 - Abnormal; Notable for the following:    Glucose, Bld 216 (*)    All other components within normal limits  POCT I-STAT 3, BLOOD GAS (G3P V) - Abnormal; Notable for the following:    pH, Ven 7.389 (*)    pCO2, Ven 43.2 (*)    Bicarbonate 26.1 (*)    All other components within normal limits  URINALYSIS, ROUTINE W REFLEX MICROSCOPIC   No results found.   1. DKA, type 1, not at goal   2. Nausea       MDM  27 year old female with diabetes.  She has moderate hyperglycemia, no signs of DKA.  We'll discharge her home to followup with her primary care Dr.  Burnis Medin prescribed Zofran for nausea.        Kalman Drape, MD 03/30/12 [phone]

## 2012-03-30 NOTE — ED Notes (Signed)
CBG checked 214

## 2012-03-30 NOTE — ED Notes (Signed)
PO fluids given to patient. Will re- evaluate.

## 2012-03-30 NOTE — ED Notes (Addendum)
Pt. Has type 1 Diabetes and an insulin pump.  Pt. States that her blood sugars have been ranging from the 400s to the 40s and she can't get them stabilized.  Pt. Is achy and her blood sugar was 71 at last check.  Pt. States she is a non-compliant pt. When it comes to monitoring her Diabetes. Pts blood sugar on arrival was 214

## 2012-03-30 NOTE — ED Notes (Signed)
Pt aware urine is needed. Unable to void at this time.

## 2012-03-30 NOTE — ED Notes (Signed)
Pt discharged to home with family. NAD.  

## 2012-04-16 DIAGNOSIS — M797 Fibromyalgia: Secondary | ICD-10-CM | POA: Insufficient documentation

## 2012-04-29 DIAGNOSIS — R768 Other specified abnormal immunological findings in serum: Secondary | ICD-10-CM | POA: Insufficient documentation

## 2012-06-05 DIAGNOSIS — G4700 Insomnia, unspecified: Secondary | ICD-10-CM | POA: Insufficient documentation

## 2013-11-05 ENCOUNTER — Ambulatory Visit (INDEPENDENT_AMBULATORY_CARE_PROVIDER_SITE_OTHER): Admitting: Family Medicine

## 2013-11-05 VITALS — Ht 60.75 in | Wt 109.3 lb

## 2013-11-05 DIAGNOSIS — F5000 Anorexia nervosa, unspecified: Secondary | ICD-10-CM
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
## 2013-11-06 ENCOUNTER — Encounter: Payer: Self-pay | Admitting: Family Medicine

## 2013-11-06 DIAGNOSIS — F5000 Anorexia nervosa, unspecified: Secondary | ICD-10-CM | POA: Insufficient documentation

## 2013-11-06 NOTE — Patient Instructions (Addendum)
Honoring her biofeedback and set healthy example to her daughter surrounding eating habits by eatng 3 meals (even if small) a day with at least 1 snack. 1. First meal within 1 hour of awakening.  2. Eat meal every 5 hours, with one snack in a day (totaling 4 times eating in a day) 3. Continue to take Levemir 3 times a week. 4. Check fasting blood sugar 3 times a week 5. Track intake goals on sheet provided  Today Please schedule follow up visit in one week: Nov. 12 @ 9 am

## 2013-11-12 ENCOUNTER — Encounter: Payer: Self-pay | Admitting: Family Medicine

## 2013-11-12 ENCOUNTER — Ambulatory Visit (INDEPENDENT_AMBULATORY_CARE_PROVIDER_SITE_OTHER): Payer: Medicaid Other | Admitting: Family Medicine

## 2013-11-12 VITALS — Wt 108.6 lb

## 2013-11-12 DIAGNOSIS — F5000 Anorexia nervosa, unspecified: Secondary | ICD-10-CM | POA: Diagnosis present

## 2013-11-12 NOTE — Progress Notes (Signed)
Patient ID: Tara Bullock, female   DOB: June 24, 1985, 28 y.o.   MRN: [id number]  Subjective: Reports she did well with her Levemir goal, achieving almost daily use until her pen broke. Her insulin pen is now replaced. She feels having the worksheet to keep track of her meals and insulin was helpful. She does not like the feeling of eating the amount on her goals, she feels too full.  - Ate 3 meals and a snack on Friday only.  - She checked Blood sugars 5x this week.  - Averaging 2-3 meals a day, sometimes snacks.  - Ate within an hour of awakening everyday but one day - She has not paid much attention to the timing of her meals.  - blood sugars:   - Fasting blood sugars: [id number]  - other: 232, 248,329  24-hr recall:  - Woke up at noon. Drank Coffee and ate small portion of lasagna within 1 hour of wakening  - Dinner ( 6:30p) - Sauteed zucchini  and teriyaki chicken.   - Snack (10p) - 6 pack of crackers- capt. wafer's with peanut butter and  honey  Typical day? No. Wednesdays are her only day to sleep in  And she was not feeling well yesterday. States her Fibromyalgia was acting up .   Breakfast today: 630a, rice krispies (1.5c) with milk. (skim 0.5c) pours out extra milk at the end of the meal.  Objective:  Wt 108 lb 9.6 oz (49.261 kg)  Assessment/Plan:  Tara Bullock is a 28 y.o. female with PMH for anorexia and type 1 DM.  Goals for next week meals are staying the same. Patient had some resistance to current goal, but was reassured that she is showing much improvement and that she is not expected to be perfect. We are expecting her to make progress weekly and need her to be challenged, but not overwhelmed. She will need to keep communication open if she indeed starts to feel overwhelmed.  Levemir and blood glucose goals are increasing to 5x a week.  - Eat within 1 hour of wakening, no more than 5 hours between meals.  - Eat three meals and one snack a day - take Levemir 5x  a week  - check blood sugars 5x a week  - complete goals sheet and bring to next visit  Barriers to learning/adherence to lifestyle change: weight gain and full feeling Demonstrated degree of understanding via:  Teach Back  Handouts given during visit include:  AVS/Goal sheet worksheet  Next appt @ 11/19 9:00am  >25 minutes spent face to face with counseling

## 2013-11-12 NOTE — Patient Instructions (Signed)
Complete goals sheet daily and bring it to your next appointment 5x Levemir, 5x blood sugars a day  3 meals with 1 with one snack a day, attempt first meal within 1 hour of awakening. In addition attempt to go no more than 5 hours between meals.   Next appt: 11/19 @ 9 am

## 2013-11-19 ENCOUNTER — Encounter: Payer: Self-pay | Admitting: Family Medicine

## 2013-11-19 ENCOUNTER — Ambulatory Visit (INDEPENDENT_AMBULATORY_CARE_PROVIDER_SITE_OTHER): Payer: Medicaid Other | Admitting: Family Medicine

## 2013-11-19 VITALS — Ht 60.75 in | Wt 106.3 lb

## 2013-11-19 DIAGNOSIS — F5000 Anorexia nervosa, unspecified: Secondary | ICD-10-CM | POA: Diagnosis present

## 2013-11-19 NOTE — Patient Instructions (Signed)
-   Review [email or web address] website  - Complete goals sheet daily and bring it to your next appointment - 5x Levemir, 5x blood sugars a day  - 3 meals with 1 with one snack a day, attempt first meal within 1 hour of awakening. In addition attempt to go no more than 5 hours between meals.  - Find a routine that works for you that reminds you to take fasting blood sugar

## 2013-11-19 NOTE — Progress Notes (Addendum)
Patient ID: Tara Bullock, female   DOB: Feb 01, 1985, 28 y.o.   MRN: [id number]  Subjective:  Patient admits she feels unfocused, not just with her diet,  but with life since her 22 year old cat died unexpectedly on 04-30-2022. She has not been following her work sheet well since. She is attempting to get up earlier to make meals times x3 and space out every 5 hours. She stills feels overwhelmed with the food content and is admitting happy she lost 3 pounds since starting nutrition clinic. She states it is like having a devil on one shoulder that is happy about the weight loss and a good person on the other shoulder telling her it is not good she lost weight.   Today: (Up at  AM) &:00 am B ( 8:30AM)-  Mini Muffins banana x4  24-hr recall: Diet tea throughout the day Up at 7 am B (8am ) Mini  Muffins banana and coffee Snk ( AM)- N/A   L ( PM)- N/A  Snk ( PM)- Wheat thins 1c  D (8 PM)- Ham steak (3oz.)  and black eyed peas (1.5c)  Snk ( PM)- N/A - went to bed at 10pm  Typical day? Yes.    Thursday - 2 meals and a snack. Uncertain if she took her levemir. Took blood sugar (123 fasting) Friday- 3 meals, no snack, levemir Saturday- 2 meals, 1 snack, blood sugar (fasting 76). Worked around the house a bunch and forgot to eat.  04/30/2022- 1 small meal. Cat passed away  Mon - 3 meals, 1 snack and levemir Tues- 2 meals, 1 snack (locked herself out of the house) Wed- 2 meals, 1 snack  Barriers to learning/adherence to lifestyle change: this week unexpected death of pet caused a set back.   Demonstrated degree of understanding via:  Teach Back   Plan:  Tara Bullock is a 28 y.o. female with anorexia nervosa and IDDM Type 1. This week pt has an unfortunate loss of her pet. She admits this was a set back to her in her goals. Since this occurred it is felt she did not have adequate focus on her goals and therefore we will not change her goal  this week. She will have two weeks to attempt her  goals again. We have asked her to write down her sugars and make a routine/reminder that works for her to remember to take it (phone reminder, place in front of morning medications).  - Review [email or web address] website  - Complete goals sheet daily and bring it to your next appointment - 5x Levemir, 5x blood sugars a day  - 3 meals with 1 with one snack a day, attempt first meal within 1 hour of awakening. In addition attempt to go    no more than 5 hours between meals.  - Find a routine that works for you that reminds you to take fasting blood sugar  Handouts given during visit include:  AVS with plan   Next appt: Dec. 3 @ 10:30   30 minutes of direct face-to-face time was spent with the patient with at least 50% of this time spent counseling the patient.

## 2013-12-03 ENCOUNTER — Encounter: Payer: Self-pay | Admitting: Family Medicine

## 2013-12-03 ENCOUNTER — Ambulatory Visit (INDEPENDENT_AMBULATORY_CARE_PROVIDER_SITE_OTHER): Payer: Medicaid Other | Admitting: Family Medicine

## 2013-12-03 VITALS — Ht 60.75 in | Wt 104.2 lb

## 2013-12-03 DIAGNOSIS — F5000 Anorexia nervosa, unspecified: Secondary | ICD-10-CM

## 2013-12-03 NOTE — Progress Notes (Signed)
Patient ID: NINOSHKA BOWHAY, female   DOB: 1985/10/29, 28 y.o.   MRN: [id number] Nutrition Clinic Visit  Pt seen in nutrition clinic with Dr. Jenne Campus. Pt presents to discuss nutrition in light of her anorexia and T1Dm, Last A1C is 13.2 (improved by 2%)  Over the last 2 weeks she has not been meeting her goals. She is taking levemir approx 1-2 X per week and checking fasting CBGs about 2X/week.  She has been eating better with 3 meals and 1 snack on 3-5 days a week. She is also doing well keeping up with her goalsheet.   She had a rough thanksgiving b/c of her relationship with her father and b/c he isn't aware of her eating disorder. She also states that she and her fiance had a near break-up over his daughter's behavior.    Physical activity:  Limited by fibromyalgia, no formal exercise, moving constanty with 28 y/o  24-hr recall: (Up at 3am AM) B (330 am AM)-  Rice krispies with milk, pack of splenda, coffee Snk (AM)-   2-3 cups of coffee L (noon PM)-   Grilled ham and cheese Snk (330 PM)-  Handful of grapes, 4-5 grapes D (6 PM)-   Biscuits and sausage gravy Snk (1am)-   Grapes - 10 or so Typical day? No.  up earlier than usual and not sleeping well.   Assessment & Plan:  KIERRA LOH is a 28 y.o. female with anorexia nervosa and T1DM. Overall she has improved her eating pattern but is still having a difficult time both monitoring her CBGs and taking her insulin. She has had consistently 1-2 lb weight losses over the last few weeks which likely represent under-treatement of her diabetes, due to noncompliance, rather than her diet changes.   Recommendations: Anorexia nervosa - Review [email or web address] website  - Complete goals sheet daily and bring it to your next appointment - Consider checking your blood sugars as soon as you start your morning coffee - Discuss morning insulin dosing with your doctor, 5x/week Levemir, fasting blood sugars (we'll add more in a few months  if you adjust to daily checks) - 3 meals with 1 with one snack a day, attempt first meal within 1 hour of awakening. In addition attempt to go no more than 5 hours between meals.    Follow up 1 week.  [name], MD Family Medicine Resident PGY-3   30 minutes of direct face-to-face time was spent with the patient with at least 50% of this time spent counseling the patient.

## 2013-12-03 NOTE — Patient Instructions (Addendum)
Come back at 9am next week and the week after  Complete goals sheet daily and bring it to your next appointment - Consider checking your blood sugars as soon as you start your morning coffee - Discuss morning insulin dosing with your doctor, 5x/week Levemir, fasting blood sugars (we'll add more in a few months if you adjust to daily checks) - 3 meals with 1 with one snack a day, attempt first meal within 1 hour of awakening. In addition attempt to go no more than 5 hours between meals.

## 2013-12-03 NOTE — Assessment & Plan Note (Signed)
-   Review [email or web address] website  - Complete goals sheet daily and bring it to your next appointment - Consider checking your blood sugars as soon as you start your morning coffee - Discuss morning insulin dosing with your doctor, 5x/week Levemir, fasting blood sugars (we'll add more in a few months if you adjust to daily checks) - 3 meals with 1 with one snack a day, attempt first meal within 1 hour of awakening. In addition attempt to go no more than 5 hours between meals.

## 2013-12-10 ENCOUNTER — Encounter: Payer: Self-pay | Admitting: Family Medicine

## 2013-12-10 ENCOUNTER — Ambulatory Visit (INDEPENDENT_AMBULATORY_CARE_PROVIDER_SITE_OTHER): Payer: Medicaid Other | Admitting: Family Medicine

## 2013-12-10 VITALS — Wt 106.9 lb

## 2013-12-10 DIAGNOSIS — F5000 Anorexia nervosa, unspecified: Secondary | ICD-10-CM | POA: Diagnosis not present

## 2013-12-10 NOTE — Assessment & Plan Note (Addendum)
Overall she has improved as it pertains to compliance  With fasting CBGs, taking levemir, and eating regularly. Today we emphasized eating veggies and consistency through the holidays.   - Review [email or web address] website  - Complete goals sheet daily and bring it to your next appointment - Keep checking your blood sugar and taking levemir when you get your coffee in the morning - 5x/week Levemir, fasting blood sugars (we'll add more in a few months if you adjust to daily checks) - 3 meals with 1 with one snack a day, attempt first meal within 1 hour of awakening. In addition attempt to go no more than 5 hours between meals. - Eat vegetables at lunch and dinner - She cannot return for 4 weeks so asked her to email Dr. Jenne Campus every Wednesday until you come back.

## 2013-12-10 NOTE — Progress Notes (Signed)
Patient ID: Tara Bullock, female   DOB: 23-Dec-1985, 28 y.o.   MRN: [id number] Nutrition Clinic Visit  Pt seen in nutrition clinic with Dr. Jenne Campus. Pt presents to discuss nutrition in light of her Anorexia and T1DM. Marland Kitchen  Physical activity: Active in daily activities of mothering her 28 year old and college classes, no formal exercise.   24-hr recall: (Up at 957 AM) B (1030 AM)-   Coffee, Ciggs, grits (2 packs ~ 3/4 cups)  Snk (throughout AM)-  tea L (1pm)-   Pintos and cornbread, tea unsweet with splenda Snk (5pm)-   Granola bar- quaker, tea D (830 PM)-   Pintos, cornbread, peas, unsweet tea Snk (930-10PM)-  Bite of snickers bar Typical day? No.  She says her biggest challenge this week with the changes were feeling like she was overeating. She feels that she is eating just b/c she is supposed to. Its hard to face your numbers.   Recommendations:  [name] is a 28 y.o. female with anorexia nervosa and T1DM.   Anorexia nervosa Overall she has improved as it pertains to compliance  With fasting CBGs, taking levemir, and eating regularly. Today we emphasized eating veggies and consistency through the holidays.   - Review [email or web address] website  - Complete goals sheet daily and bring it to your next appointment - Keep checking your blood sugar and taking levemir when you get your coffee in the morning - 5x/week Levemir, fasting blood sugars (we'll add more in a few months if you adjust to daily checks) - 3 meals with 1 with one snack a day, attempt first meal within 1 hour of awakening. In addition attempt to go no more than 5 hours between meals. - Eat vegetables at lunch and dinner - She cannot return for 4 weeks so asked her to email Dr. Jenne Campus every Wednesday until you come back.    Filed Weights   12/10/13 0906  Weight: 106 lb 14.4 oz (48.49 kg)     [name], MD Family Medicine Resident PGY-3   30 minutes of direct face-to-face time was spent with the  patient with at least 50% of this time spent counseling the patient.

## 2013-12-10 NOTE — Patient Instructions (Addendum)
  Great to see you!  Here are the things we discussed today:  - Review [email or web address] website  - Complete goals sheet daily and bring it to your next appointment - Keep checking your blood sugar and taking levemir when you get your coffee in the morning - 5x/week Levemir, fasting blood sugars (we'll add more in a few months if you adjust to daily checks) - 3 meals with 1 with one snack a day, attempt first meal within 1 hour of awakening. In addition attempt to go no more than 5 hours between meals. -Eat vegetables at lunch and dinner  - Email Dr. Jenne Campus every Wednesday until you come back.   Make an appt for January 14th

## 2013-12-17 ENCOUNTER — Ambulatory Visit (INDEPENDENT_AMBULATORY_CARE_PROVIDER_SITE_OTHER): Payer: Medicaid Other | Admitting: Family Medicine

## 2013-12-17 ENCOUNTER — Ambulatory Visit: Payer: Medicaid Other | Admitting: Family Medicine

## 2013-12-17 VITALS — Ht 60.75 in | Wt 110.7 lb

## 2013-12-17 DIAGNOSIS — F5000 Anorexia nervosa, unspecified: Secondary | ICD-10-CM

## 2013-12-17 NOTE — Assessment & Plan Note (Signed)
1. Eat within the first hour of getting up, and at least every 5 hours while awake.  In addition, make sure you have at least 1 snack during the day.   2. Continue to get your Levemir and check your blood sugar each morning.   3. Eat vegetables twice a day 4. Email Dr. Jenne Campus every Wednesday until you meet again

## 2013-12-17 NOTE — Progress Notes (Signed)
Patient ID: Tara Bullock, female   DOB: 1985/11/13, 28 y.o.   MRN: [id number] Nutrition Clinic Visit  Pt seen in nutrition clinic with Dr. Jenne Campus. Pt presents to discuss nutrition in light of her T1DM and Anorexia.  Physical activity: routine mom activity, considering yoga  24-hr recall: (Up at 710am) B (8am)-   Fruity pebbles (skim milk 1/2 , 2cups fruity pebbles) 2 cups coffee (splenda, sugar free creamer approx 1 tbsp) Snk -    None L (1247 PM)-   Bo-bites approx 7, 1/2 c green beans, 1/2 c mashed pot, biscuit, diet mnt dew Snk (5 PM)-   homemade fudge inch cube D (930 PM)-   1 1/2 cups chinese noodles sesame sauce with chick/carots/brocc/red peppers, 1 cups unsweet tea with splenda Snk -    None Typical day? No.   Fasting CBGs - 160-220 Random CBGs - 200-350 Taken levemir daily  Has checked CBGs daily, multiple times daily now  Vegetables - Slowly increasing intake, has bought lots of frozen veggies  Recommendations:  Tara Bullock is a 28 y/o female with T1DM and anorexia nervosa. She has a very challenging situation given her desire to not gain weight and her requirement for insulin and subsequent food intake to not develop hypoglycemia. She has made great strides over the last several weeks. She's had improvement in consistent insulin intake and CBG checks. We are emphasizing intake of higher quality foods and maintaining her success through the holiday season.   Anorexia nervosa 1. Eat within the first hour of getting up, and at least every 5 hours while awake.  In addition, make sure you have at least 1 snack during the day.   2. Continue to get your Levemir and check your blood sugar each morning.   3. Eat vegetables twice a day 4. Email Dr. Jenne Campus every Wednesday until you meet again   [name], MD Family Medicine Resident PGY-3   30 minutes of direct face-to-face time was spent with the patient with at least 50% of this time spent counseling the  patient.

## 2013-12-17 NOTE — Patient Instructions (Signed)
Great to see you!  1. Eat within the first hour of getting up, and at least every 5 hours while awake.  In addition, make sure you have at least 1 snack during the day.   2. Continue to get your Levemir and check your blood sugar each morning.   3. Eat vegetables twice a day 4. Email Dr. Jenne Campus every Wednesday until you meet again

## 2014-01-14 ENCOUNTER — Ambulatory Visit: Payer: Medicaid Other | Admitting: Family Medicine

## 2014-01-14 ENCOUNTER — Telehealth: Payer: Self-pay | Admitting: Family Medicine

## 2014-01-14 NOTE — Telephone Encounter (Signed)
Called to discuss missed appt, No answer so I left VM to call Dr. Jenne Campus.   [name], MD [facility] Resident, PGY-3 01/14/2014, 11:26 AM

## 2014-01-21 ENCOUNTER — Ambulatory Visit (INDEPENDENT_AMBULATORY_CARE_PROVIDER_SITE_OTHER): Payer: Medicaid Other | Admitting: Family Medicine

## 2014-01-21 VITALS — Ht 60.75 in | Wt 114.6 lb

## 2014-01-21 DIAGNOSIS — E109 Type 1 diabetes mellitus without complications: Secondary | ICD-10-CM

## 2014-01-21 DIAGNOSIS — F5000 Anorexia nervosa, unspecified: Secondary | ICD-10-CM

## 2014-01-21 NOTE — Progress Notes (Signed)
Patient ID: Tara Bullock, female   DOB: 12/05/1985, 29 y.o.   MRN: [id number] Nutrition Clinic Visit  Pt seen in nutrition clinic with Dr. Jenne Campus. Pt presents to discuss nutrition in light of her T1DM and anorexia.  Physical activity: routine mom physical activity  24-hr recall: (Up at 7 AM) Snk (8 AM)-   Pastry crisps B (9 AM)-   Bacon egg and cheese biscuit, coffee L (PM)-   none Snk (1:30 PM)-  Cheese stick, vegetables (3 cups) D (6 PM)-   Does not remember what she ate Snk (PM)-   Whales - like gold fish Typical day? Yes.    Fasting CBGs - 110-381, mostly 110-280's Random CBGs - checks if feeling bad, 71 is the one she can remember from couple weeks ago Taken levemir dailyevery morning - now on 22 u levemir - is in process of getting inhaled insulin Is checking CBGs daily, missed one morning  She is doing better with vegetables. Is averaging 2 servings a day. She does note that she is using V8 fusion juice as a serving.  Discussed upset stomach with eating vegetables. States this is a bloating sensation. Discussed stomach being a muscle and needing to be worked out. This is likely related to her prior restrictive issues. Also with bloating could be related to gut microbes or related to increased fiber intake. Advised to obtain and take probiotic and digestive enzymes. Given card with [employer] name as source of these supplements.   Recommendations:  1. Eat at least 3 real meals during the day and one snack going no more than 5 hours between eating. 2. Continue to get your Levemir and check your blood sugar each morning.   3. Eat vegetables twice a day. Limit intake to 1-2 cups per serving. 4.   Discussed real vegetables vs juice vegetables - use juice as back up, but not a planned vegetable  Follow up every other week, with emailing Dr Jenne Campus in between.  Tommi Rumps, MD Family Medicine Resident PGY-3   30 minutes of direct face-to-face time was spent  with the patient with at least 50% of this time spent counseling the patient with regards to diet and nutrition.

## 2014-01-21 NOTE — Patient Instructions (Addendum)
Nice to see you. Please work on the following goals.  1. Eat at least 3 real meals during the day and one snack going no more than 5 hours between eating. 2. Continue to get your Levemir and check your blood sugar each morning.   3. Eat vegetables twice a day. Limit intake to 1-2 cups per serving.   ASK ABOUT A1c

## 2014-02-04 ENCOUNTER — Ambulatory Visit: Payer: Medicaid Other | Admitting: Family Medicine

## 2014-02-25 ENCOUNTER — Ambulatory Visit (INDEPENDENT_AMBULATORY_CARE_PROVIDER_SITE_OTHER): Payer: Medicaid Other | Admitting: Family Medicine

## 2014-02-25 VITALS — Ht 60.75 in | Wt 117.9 lb

## 2014-02-25 DIAGNOSIS — F5000 Anorexia nervosa, unspecified: Secondary | ICD-10-CM

## 2014-02-25 MED ORDER — INSULIN REGULAR HUMAN 4 UNITS IN POWD: 4.0000 [IU] | Freq: Every day | RESPIRATORY_TRACT | Status: DC

## 2014-02-25 NOTE — Assessment & Plan Note (Signed)
Recommendations - Goal sheet.  1. Eat at least 3 real meals during the day and one snack, with no more than 5 hours between eating. 2. Eat vegetables twice a day. For now, limit intake to 1-2 cups per serving.

## 2014-02-25 NOTE — Patient Instructions (Signed)
Recommendations: 1. Eat at least 3 real meals during the day and one snack, with no more than 5 hours between eating. 2. Eat vegetables twice a day. For now, limit intake to 1-2 cups per serving.  Schedule for March 10th and March 24th.

## 2014-02-25 NOTE — Progress Notes (Signed)
Nutrition Clinic Visit:  Pt seen in nutrition clinic with Dr. Jenne Campus. Pt presents to discuss nutrition in light of her T1DM and anorexia.  Currently on Levemir 22 units daily and Afrezza (4-8 units w/ the largest meal of the day). She saw her Endocrinologist last week, A1C was 11.2. Checking fasting sugars.  Range - 129 - 400's.  She has had a few hypoglycemic events (50's).  Reports she is averaging 1 meal & 1 snack daily. She has been sick recently and has had poor intake.  She has also been under stress with custody hearings regarding one of her stepdaughters. She states that "I feel good when I'm not eating".   Patient states that her goals are: "To be skinny", "To Live".  Physical activity: None.   24-hr recall: (Up at  8 AM) B (8-830 AM)- 2 cups coffee (2 splenda, sugar free coffee make - french vanilla),  Instant grits, butter flavor 2 packets. Snk - No snack. L (12 PM)- Carrots and Ranch dressing (doesn't recall how much; estimate ~2 cups), 2 cups of coffee (starbucks). Snk - No snack. D (5 PM)- Soup, Chunky Sirloin/Vegetable, 1 can.   Snk (830-9 PM)- 1 corndog, fried dill pickle (1 cup), Diet Mt Dew. Snk (10 PM)- 3 Hersey's Kisses.  Typical day? No. More than her normal intake.   Recommendations:  1. Eat at least 3 real meals during the day and one snack, with no more than 5 hours between eating. 2. Eat vegetables twice a day. For now, limit intake to 1-2 cups per serving.  > 30 minutes were spent face-to-face with the patient during this encounter and all of that time was spent on counseling and coordination of care.   [place] PGY-3

## 2014-03-09 ENCOUNTER — Other Ambulatory Visit: Payer: Self-pay | Admitting: Family Medicine

## 2014-03-09 DIAGNOSIS — N63 Unspecified lump in unspecified breast: Secondary | ICD-10-CM

## 2014-03-11 ENCOUNTER — Ambulatory Visit (INDEPENDENT_AMBULATORY_CARE_PROVIDER_SITE_OTHER): Payer: Medicaid Other | Admitting: Family Medicine

## 2014-03-11 VITALS — Ht 60.75 in | Wt 108.5 lb

## 2014-03-11 DIAGNOSIS — E109 Type 1 diabetes mellitus without complications: Secondary | ICD-10-CM

## 2014-03-11 DIAGNOSIS — F5000 Anorexia nervosa, unspecified: Secondary | ICD-10-CM

## 2014-03-11 NOTE — Patient Instructions (Signed)
1. Eat at least 3 real meals during the day and one snack, with no more than 5 hours between eating. 2. Eat vegetables twice a day. For now, limit intake to 1-2 cups per serving.  Get rest and sleep this weekend. Good job on regular habituation of your taking levemir and checking blood sugars. You identified a barrier being remembering to eat. Model healthy pattern of eating (3 meals) for your daughter as well can help you remember.  Follow up 3/31 at 9AM.

## 2014-03-11 NOTE — Progress Notes (Signed)
Patient ID: Tara Bullock, female   DOB: March 20, 1985, 29 y.o.   MRN: [id number]  Subsequent nutrition visit with Dr Jenne Campus.  Diagnosis / reason for nutrition visit: Type I diabetes, anorexia  Barriers to learning/adherence to lifestyle change: Remembering to eat  24-hr recall: (Up at 7:30 AM) -  2.5 cups coffee, 4 packets splenda, 1 tbsp sugar-free creamer B (10:15 AM)-   Banana nut muffin (1), coke zero (see below) Snk (- AM)-    None L (2 PM)-   8 inch sub sandwich (lettuce, tomato, [place], 2 types of cheese, ham, italian dressing) and carrot sticks (1 cup), coke zero (see below) Snk (4 PM)-   Little debbie chocolate easter cake D (- PM)-   None Snk (9 PM)-   V8 vegetable / fruit juice (~8 oz) (1/2 2L over course of entire day) Typical day? No.  Usually 1-2 meals and 2 snacks daily.  Stressors: Per pt intake has been down with daughter having flu recently and pt having viral illness. Not surprised her weight is down. Had an episode of hypoglycemia day she took dtr to doctor. Found out father selling family company, recent illness and daughter's illness, found right breast lump at physical this week with h/o benign lumpectomy Through this, continued to take levemir and check FBGs.  Goals: Completed her sheets but left on fridge.  -Eat within 1 hr of waking -Eat meals or snack within 5 hrs -Vegetables 2x/day -3 meals and at least 1 snack Feels it has not gone well on any of these because of stressors noted above. Hoping things will get better as dtr gets better and she gets better and dtr away this weekend. Also on spring break this week. Has been making healthy choices when she does eat  Recommendations: Thinks she can do better with this as things calm down this week.   # Physical activity Did not address today  # Anorexia Eat at least 3 meals during the day - Able to prioritize DM care when stressed, but self-care re: eating falls off.  Discussed some of the  complications of prolonged inadequate feeding regarding discomfort and early fullness that she would like to avoid. Identified a barrier being remembering to eat. Model healthy pattern of eating (3 meals) for your daughter as well can help you remember.  # DM  Emotionally stress likely contributing to fluctuating BG control. Is now checking blood sugar and taking levemir regularly.    Handouts given during visit include:  AVS  Demonstrated degree of understanding via:  Teach Back   Monitoring/Evaluation:  Dietary intake, exercise, blood sugars if diabetic, and body weight in 2 week(s).  I have spent at least 45 minutes in room with patient, >50% of this time used in counseling.

## 2014-03-11 NOTE — Assessment & Plan Note (Signed)
Eat at least 3 meals during the day - Able to prioritize DM care when stressed, but self-care re: eating falls off.  Discussed some of the complications of prolonged inadequate feeding regarding discomfort and early fullness that she would like to avoid. Identified a barrier being remembering to eat. Model healthy pattern of eating (3 meals) for your daughter as well can help you remember.

## 2014-03-11 NOTE — Progress Notes (Signed)
I was the preceptor on the day of this visit.   Dia Jefferys MD  

## 2014-03-11 NOTE — Assessment & Plan Note (Signed)
Emotionally stress likely contributing to fluctuating BG control. Is now checking blood sugar and taking levemir regularly.

## 2014-03-12 ENCOUNTER — Ambulatory Visit
Admission: RE | Admit: 2014-03-12 | Discharge: 2014-03-12 | Disposition: A | Discharge status: Home / Self Care | Payer: Medicaid Other | Source: Ambulatory Visit | Attending: Family Medicine | Admitting: Family Medicine

## 2014-03-12 ENCOUNTER — Other Ambulatory Visit: Payer: Self-pay | Admitting: Family Medicine

## 2014-03-12 DIAGNOSIS — N63 Unspecified lump in unspecified breast: Secondary | ICD-10-CM

## 2014-03-16 ENCOUNTER — Ambulatory Visit
Admission: RE | Admit: 2014-03-16 | Discharge: 2014-03-16 | Disposition: A | Discharge status: Home / Self Care | Payer: Medicaid Other | Source: Ambulatory Visit | Attending: Family Medicine | Admitting: Family Medicine

## 2014-03-16 ENCOUNTER — Other Ambulatory Visit: Payer: Self-pay | Admitting: Family Medicine

## 2014-03-16 DIAGNOSIS — N63 Unspecified lump in unspecified breast: Secondary | ICD-10-CM

## 2014-03-25 ENCOUNTER — Ambulatory Visit: Payer: Medicaid Other | Admitting: Family Medicine

## 2014-04-01 ENCOUNTER — Ambulatory Visit (INDEPENDENT_AMBULATORY_CARE_PROVIDER_SITE_OTHER): Payer: Medicaid Other | Admitting: Family Medicine

## 2014-04-01 VITALS — Ht 61.0 in | Wt 108.3 lb

## 2014-04-01 DIAGNOSIS — E109 Type 1 diabetes mellitus without complications: Secondary | ICD-10-CM

## 2014-04-01 DIAGNOSIS — F5000 Anorexia nervosa, unspecified: Secondary | ICD-10-CM

## 2014-04-01 DIAGNOSIS — J302 Other seasonal allergic rhinitis: Secondary | ICD-10-CM

## 2014-04-01 DIAGNOSIS — J309 Allergic rhinitis, unspecified: Secondary | ICD-10-CM

## 2014-04-01 MED ORDER — FLUTICASONE PROPIONATE 50 MCG/ACT NA SUSP: 1.0000 | Freq: Every day | NASAL | Status: DC

## 2014-04-01 NOTE — Progress Notes (Signed)
Patient ID: Tara Bullock, female   DOB: [birth year], 29 y.o.   MRN: [id number]  Subsequent nutrition visit with Dr Jenne Campus.  Diagnosis / reason for nutrition visit: Type I diabetes, anorexia   Subjective  Barriers to learning/adherence to lifestyle change: Remembering to eat  Stressors:  Things have been better. Everyone has been healthy. Back to baseline now.  Typical stressors - youngest stepdaughter is acting out again. She has some behavioral issues.  Blood sugars still "all over the place" - nearly 400 recent AM fasting 3 days ago; she had been having lots of pain the night before which she wonders may have spiked it. Lowest was 57 or 67 and she was feeling jittery so ate. That was mid-day. Had not skipped bfast. Took afreza night with dinner which was only difference (usually takes 4 units with the meal that has the most carbs once daily). Typical around 200 past couple weeks. Goal sheet: 1 day in past 3 weeks when she ate 3 meals, 2 snacks, and 2 veg servings.   - She identifies time as her barrier to having more days like this (this was a Saturday), and feeling like this is lots of food  - She does not notice any reason that she had 1 day like this, other than likely hungry that day  - She does not notice any better focus or energy after eating. She actually feels more tired after eating. Typical bfast is cereal (high fiber) with ~54g carbs. Continued to take levemir and check FBGs. Takes afrezza with largest carb meal of the day. Usually she does not get many carbs. Cholesterol was high at PCP check.  Goals: Completed her sheets and brought in. Her overall goal is better diet (enough food to have energy to go full day in a day and function) and keeping BG under control. -Eat within 1 hr of waking -Eat meals or snack within 5 hrs -Vegetables 2x/day -3 meals and at least 1 snack Decreased stressors, and goals have improved - having at least 2 meals and 1 snack daily. Has  been making healthy choices when she does eat. However, has high carbs with typical cereal breakfast. -Trial lower carb breakfast with more protein. Discussed changing taste preferences away from sweet. Pt struggled with this concept (specific goal of diluting sweet yogurt with plain yogurt) but willing to keep it in back of her mind.   Objective: 24-hr recall:  (Up at 730 AM) 2 cups coffee with splenda and sugar-free coffee-mate B (830 AM)-   1 otis spunkmeyer muffin, <1 cup 1% milk Snk ( AM)-   Brewed tea with splenda L (2 PM)-   Carrots (2 cups) with ranch, had something else but cannot recall Snk ( PM)-   Tea with splenda D (8 PM)-   Sandwich (4 slices [place], 1 slice swiss, 1 cheddar, mayonnaise, salt and pepper on 2 pieces sliced bread, tea Snk ( PM)-   None Asleep 12AM Typical day? No.  - Just a little atypical because trying to improve   Recommendations:   # Physical activity Did not address today  # Anorexia Eat at least 3 meals during the day - Goal is improved energy and BG control. Able to prioritize DM care when stressed, but self-care re: eating falls off.  - focus on daily breakfast along with some protein - consider plain mixed with sweet yogurt - Identified a barrier being remembering to eat. Model healthy pattern of eating (3 meals) for your daughter as  well can help you remember. - Compare how you feel eating different foods in morning, when first get hungry.  # DM  Emotional stress likely contributing to fluctuating BG control. Is now checking blood sugar and taking levemir regularly.  - Be sure to f/u with endocrinologist given hypo- and hyperglycemic episode (next appt May 16 - will try to make this early AM appt to f/u on HLD). - Send [place] specific cholesterol results    # Allergic Rhinitis Symptoms starting in springtime, rhinorrhea, itchy eyes - Rx'ed flonase for trial   Handouts given during visit include:  AVS   Demonstrated degree of understanding  via:  Teach Back   Monitoring/Evaluation:  Dietary intake, exercise, blood sugars, and body weight in 2 week(s).  I have spent at least 45 minutes in room with patient, >50% of this time used in counseling.

## 2014-04-01 NOTE — Assessment & Plan Note (Signed)
Emotional stress likely contributing to fluctuating BG control. Is now checking blood sugar and taking levemir regularly.  - Be sure to f/u with endocrinologist given hypo- and hyperglycemic episode (next appt May 16 - will try to make this early AM appt to f/u on HLD). - Send [place] specific cholesterol results

## 2014-04-01 NOTE — Assessment & Plan Note (Signed)
Eat at least 3 meals during the day - Goal is improved energy and BG control. Able to prioritize DM care when stressed, but self-care re: eating falls off.  - focus on daily breakfast along with some protein - consider plain mixed with sweet yogurt - Identified a barrier being remembering to eat. Model healthy pattern of eating (3 meals) for your daughter as well can help you remember. - Compare how you feel eating different foods in morning, when first get hungry.

## 2014-04-01 NOTE — Assessment & Plan Note (Signed)
Symptoms starting in springtime, rhinorrhea, itchy eyes - Rx'ed flonase for trial

## 2014-04-01 NOTE — Patient Instructions (Signed)
Keep working on your goals with the end goal being higher energy in your day and better blood sugar control. - One of the goals to focus on could be breakfast within 1 hour of waking; even if not hungry, something small with protein. - Try comparing when you first start getting hungry in the day based on what, how much, and when you eat. - Get the specific number results of your cholesterol to [place] if possible. - Change your next endo appt to early morning so you can go in fasting. - Next appt - 21st April at 11AM - Schedule 1 hour with Dr Jenne Campus.

## 2014-04-22 ENCOUNTER — Ambulatory Visit: Payer: Medicaid Other | Admitting: Family Medicine

## 2014-05-06 ENCOUNTER — Ambulatory Visit: Payer: Medicaid Other | Admitting: Family Medicine

## 2014-05-20 ENCOUNTER — Encounter: Payer: Self-pay | Admitting: Family Medicine

## 2014-05-20 ENCOUNTER — Ambulatory Visit: Payer: Medicaid Other | Admitting: Family Medicine

## 2014-05-20 NOTE — Patient Instructions (Addendum)
-   Please bring all blood test results to next appt (or ask them to fax to [phone], attn Dr. Jenne Campus).   - Complete your goals sheet and bring to follow-up.   Goals: 1. Check FBG and take Levemir each morning.   2. Eat at least 3 real meals during the day and one snack, with no more than 5 hours between eating. 3. Eat vegetables twice a day. For now, limit intake to 1-2 cups per serving.

## 2014-05-20 NOTE — Progress Notes (Signed)
Patient ID: JALENE LAWRIE, female   DOB: 1985/08/06, 29 y.o.   MRN: [id number] Endocrinologist: Susette Racer, MD [phone]) PCP: Billey Chang, MD [phone]; [place])  Nevin Bloodgood came in for a weight check today.  She has not been taking her Afrezza inhaled insulin or Levemir consistently.  Has only taken Afrezza once or twice in the past month.  She has back on Levemir for 6 straight days now.    24-hr recall:  (Up at 6 AM; took Levemir) B (6 AM)-  1 c coffee, sug-fr flavored, creamer, 2 Splenda B (9 AM)-  Bacon, egg, chs biscuit, small hashbrowns, diet swt tea Snk ( AM)-  Diet Mtn Dew L (12:30 PM)-  1 chx brst, [employer] hot fudge sundae, 1 kiwi Snk ( PM)-  --- D (6 PM)-  4 c potroast, potatoes, carrots, onions, diet swt tea Snk (11:30)-  1/2 c S'mores Goldfish Typical day? Yes.

## 2014-06-17 ENCOUNTER — Ambulatory Visit: Payer: Medicaid Other | Admitting: Family Medicine

## 2014-10-28 ENCOUNTER — Emergency Department (HOSPITAL_BASED_OUTPATIENT_CLINIC_OR_DEPARTMENT_OTHER): Payer: Medicaid Other

## 2014-10-28 ENCOUNTER — Encounter (HOSPITAL_BASED_OUTPATIENT_CLINIC_OR_DEPARTMENT_OTHER): Payer: Self-pay | Admitting: Emergency Medicine

## 2014-10-28 ENCOUNTER — Emergency Department (HOSPITAL_BASED_OUTPATIENT_CLINIC_OR_DEPARTMENT_OTHER)
Admission: EM | Admit: 2014-10-28 | Discharge: 2014-10-29 | Disposition: A | Discharge status: Home / Self Care | Payer: Medicaid Other | Attending: Emergency Medicine | Admitting: Emergency Medicine

## 2014-10-28 DIAGNOSIS — M797 Fibromyalgia: Secondary | ICD-10-CM | POA: Diagnosis not present

## 2014-10-28 DIAGNOSIS — F329 Major depressive disorder, single episode, unspecified: Secondary | ICD-10-CM | POA: Diagnosis not present

## 2014-10-28 DIAGNOSIS — Z79899 Other long term (current) drug therapy: Secondary | ICD-10-CM | POA: Insufficient documentation

## 2014-10-28 DIAGNOSIS — R1030 Lower abdominal pain, unspecified: Secondary | ICD-10-CM | POA: Diagnosis present

## 2014-10-28 DIAGNOSIS — Z794 Long term (current) use of insulin: Secondary | ICD-10-CM | POA: Diagnosis not present

## 2014-10-28 DIAGNOSIS — Z85828 Personal history of other malignant neoplasm of skin: Secondary | ICD-10-CM | POA: Insufficient documentation

## 2014-10-28 DIAGNOSIS — Z720 Tobacco use: Secondary | ICD-10-CM | POA: Insufficient documentation

## 2014-10-28 DIAGNOSIS — E119 Type 2 diabetes mellitus without complications: Secondary | ICD-10-CM | POA: Insufficient documentation

## 2014-10-28 DIAGNOSIS — N12 Tubulo-interstitial nephritis, not specified as acute or chronic: Secondary | ICD-10-CM

## 2014-10-28 DIAGNOSIS — R000 Tachycardia, unspecified: Secondary | ICD-10-CM | POA: Insufficient documentation

## 2014-10-28 DIAGNOSIS — Z3202 Encounter for pregnancy test, result negative: Secondary | ICD-10-CM | POA: Diagnosis not present

## 2014-10-28 DIAGNOSIS — F419 Anxiety disorder, unspecified: Secondary | ICD-10-CM | POA: Insufficient documentation

## 2014-10-28 HISTORY — DX: Anorexia nervosa, unspecified: F50.00

## 2014-10-28 HISTORY — DX: Fibromyalgia: M79.7

## 2014-10-28 LAB — URINE MICROSCOPIC-ADD ON

## 2014-10-28 LAB — COMPREHENSIVE METABOLIC PANEL
ALT: 13 U/L — AB (ref 14–54)
AST: 14 U/L — AB (ref 15–41)
Albumin: 3.3 g/dL — ABNORMAL LOW (ref 3.5–5.0)
Alkaline Phosphatase: 113 U/L (ref 38–126)
Anion gap: 7 (ref 5–15)
BUN: 13 mg/dL (ref 6–20)
CHLORIDE: 100 mmol/L — AB (ref 101–111)
CO2: 28 mmol/L (ref 22–32)
CREATININE: 0.92 mg/dL (ref 0.44–1.00)
Calcium: 8.8 mg/dL — ABNORMAL LOW (ref 8.9–10.3)
GFR calc Af Amer: >60 mL/min (ref >60)
GFR calc non Af Amer: >60 mL/min (ref >60)
Glucose, Bld: 401 mg/dL — ABNORMAL HIGH (ref 65–99)
Potassium: 4.4 mmol/L (ref 3.5–5.1)
SODIUM: 135 mmol/L (ref 135–145)
Total Bilirubin: 0.3 mg/dL (ref 0.3–1.2)
Total Protein: 6.3 g/dL — ABNORMAL LOW (ref 6.5–8.1)

## 2014-10-28 LAB — URINALYSIS, ROUTINE W REFLEX MICROSCOPIC
Bilirubin Urine: NEGATIVE
Glucose, UA: >1000 mg/dL — AB
KETONES UR: NEGATIVE mg/dL
NITRITE: POSITIVE — AB
PH: 6 (ref 5.0–8.0)
Protein, ur: 100 mg/dL — AB
Specific Gravity, Urine: 1.022 (ref 1.005–1.030)
Urobilinogen, UA: 0.2 mg/dL (ref 0.0–1.0)

## 2014-10-28 LAB — CBC
HCT: 37 % (ref 36.0–46.0)
Hemoglobin: 12.3 g/dL (ref 12.0–15.0)
MCH: 27.2 pg (ref 26.0–34.0)
MCHC: 33.2 g/dL (ref 30.0–36.0)
MCV: 81.7 fL (ref 78.0–100.0)
PLATELETS: 306 10*3/uL (ref 150–400)
RBC: 4.53 MIL/uL (ref 3.87–5.11)
RDW: 12.1 % (ref 11.5–15.5)
WBC: 16.8 10*3/uL — AB (ref 4.0–10.5)

## 2014-10-28 LAB — PREGNANCY, URINE: Preg Test, Ur: NEGATIVE

## 2014-10-28 MED ORDER — DEXTROSE 5 % IV SOLN
1.0000 g | Freq: Once | INTRAVENOUS | Status: AC
  Administered 2014-10-28: 1 g via INTRAVENOUS

## 2014-10-28 MED ORDER — SODIUM CHLORIDE 0.9 % IV BOLUS (SEPSIS)
1000.0000 mL | Freq: Once | INTRAVENOUS | Status: AC
Start: 2014-10-28 — End: 2014-10-29
  Administered 2014-10-28: 1000 mL via INTRAVENOUS

## 2014-10-28 MED ORDER — CEFTRIAXONE SODIUM 1 G IJ SOLR
INTRAMUSCULAR | Status: AC
  Filled 2014-10-28: qty 10

## 2014-10-28 NOTE — ED Notes (Signed)
Pt c/o left lower back and flank pain with cloudy foul smelling urine

## 2014-10-28 NOTE — ED Provider Notes (Signed)
CSN: [id number]     Arrival date & time 10/28/14  2029 History  By signing my name below, I, Helane Gunther, attest that this documentation has been prepared under the direction and in the presence of Sharlett Iles, MD. Electronically Signed: Helane Gunther, ED Scribe. 10/28/2014. 10:44 PM.    Chief Complaint  Patient presents with  . Flank Pain   The history is provided by the patient. No language interpreter was used.   HPI Comments: Tara Bullock is a 29 y.o. female with a PMHx of Type 1 DM who presents to the Emergency Department complaining of waxing and waning, aching, left-sided flank pain onset tonight. She reports associated left-sided lower back pain, mild lower abdominal pain (onset tonight), malodorous urine and discomfort with urination onset 1.5 weeks ago, as well as n/v/d 1 week ago (resolved). She states she has never had a similar pain in her back/flank before. She also note that she regularly has intermittent leg swelling. She states she does not check her blood sugars, but that she takes her insulin (Levemir, long-acting) in the mornings. She last checked her insulin 1 week ago at which point it was at 120. She states she is on Gabapentin and Tramadol for fibromyalgia, and notes she took a tramadol before coming to the ED. She notes a PMHx of UTI, most recently 6 months ago. She also reports a PMHx of eating disorder, and subsequent weight fluctuance. She denies a PMHx of kidney stones. Pt denies fever and hematuria.   Past Medical History  Diagnosis Date  . Diabetes mellitus   . Anxiety   . Skin cancer   . Fibromyalgia   . Anorexia nervosa    Past Surgical History  Procedure Laterality Date  . Other surgical history      cyst removed from face  . Back surgery     Family History  Problem Relation Age of Onset  . Hypertension Mother   . Hypertension Father    Social History  Substance Use Topics  . Smoking status: Current Every Day Smoker -- 1.00  packs/day  . Smokeless tobacco: None  . Alcohol Use: Yes     Comment: occasional   OB History    No data available     Review of Systems 10 Systems reviewed and all are negative for acute change except as noted in the HPI.  Allergies  Ivp dye and Adhesive  Home Medications   Prior to Admission medications   Medication Sig Start Date End Date Taking? Authorizing Provider  ALPRAZolam Duanne Moron) 0.5 MG tablet Take 0.5 mg by mouth daily as needed for sleep or anxiety.    Historical Provider, MD  cephALEXin (KEFLEX) 500 MG capsule Take 1 capsule (500 mg total) by mouth 2 (two) times daily. 10/29/14   Sharlett Iles, MD  CYMBALTA 30 MG capsule Take 30 mg by mouth daily.  10/26/13   Historical Provider, MD  fluticasone Asencion Islam ALLERGY RELIEF) 50 MCG/ACT nasal spray Place 1 spray into both nostrils daily. 04/01/14   Hilton Sinclair, MD  gabapentin (NEURONTIN) 300 MG capsule Take 600 mg by mouth 3 (three) times daily.  10/26/13   Historical Provider, MD  HYDROcodone-acetaminophen (NORCO/VICODIN) 5-325 MG tablet Take 1-2 tablets by mouth every 4 (four) hours as needed for severe pain. 10/29/14   Sharlett Iles, MD  hyoscyamine (LEVSIN, ANASPAZ) 0.125 MG tablet Take 0.125 mg by mouth every 4 (four) hours as needed for cramping.    Historical Provider, MD  Insulin Regular Human (AFREZZA) 4 UNITS POWD Inhale 4-8 Units into the lungs daily. W/ largest meal of the day. Patient not taking: Reported on 05/20/2014 02/25/14   Barnie Del Cook, DO  LEVEMIR FLEXTOUCH 100 UNIT/ML Pen 22 Units daily. 08/27/13   Historical Provider, MD  levonorgestrel (MIRENA) 20 MCG/24HR IUD 1 each by Intrauterine route.    Historical Provider, MD  naproxen sodium (ANAPROX) 220 MG tablet Take 220 mg by mouth as needed.    Historical Provider, MD  ondansetron (ZOFRAN ODT) 4 MG disintegrating tablet Take 1 tablet (4 mg total) by mouth every 8 (eight) hours as needed for nausea or vomiting. 10/29/14   Sharlett Iles, MD  promethazine (PHENERGAN) 12.5 MG tablet Take 12.5 mg by mouth daily as needed.     Historical Provider, MD  traMADol (ULTRAM) 50 MG tablet 3 times daily.  10/28/13   Historical Provider, MD   BP 149/94 mmHg  Pulse 102  Temp(Src) 98.1 F (36.7 C) (Oral)  Resp 18  Ht 5\' 1"  (1.549 m)  Wt 114 lb (51.71 kg)  BMI 21.55 kg/m2  SpO2 100%  LMP  Physical Exam  Constitutional: She is oriented to person, place, and time. She appears well-developed and well-nourished.  uncomfortable  HENT:  Head: Normocephalic and atraumatic.  Mouth/Throat: Oropharynx is clear and moist.  Eyes: Conjunctivae are normal. Pupils are equal, round, and reactive to light.  Neck: Normal range of motion.  Cardiovascular: Regular rhythm and normal heart sounds.   tachycardic  Pulmonary/Chest: Effort normal and breath sounds normal.  Abdominal: Soft. Bowel sounds are normal. She exhibits no distension. There is no tenderness.  Genitourinary:  Left CVA TTP  Musculoskeletal: Normal range of motion. She exhibits no edema.  Neurological: She is alert and oriented to person, place, and time.  Skin: Skin is warm and dry.  Psychiatric: Judgment normal.  Depressed mood, tearful  Nursing note and vitals reviewed.   ED Course  Procedures  DIAGNOSTIC STUDIES: Oxygen Saturation is 100% on RA, normal by my interpretation.    COORDINATION OF CARE: 10:42 PM - Discussed lab results of high blood sugar and possible pyelonephritis. Discussed plans to order IV fluids and antibiotics, as well as an abdomen CT. Advised pt to return to the ED is sx worsen. Pt advised of plan for treatment and pt agrees.  Labs Review Labs Reviewed  URINALYSIS, ROUTINE W REFLEX MICROSCOPIC (NOT AT [facility]) - Abnormal; Notable for the following:    APPearance CLOUDY (*)    Glucose, UA >1000 (*)    Hgb urine dipstick LARGE (*)    Protein, ur 100 (*)    Nitrite POSITIVE (*)    Leukocytes, UA MODERATE (*)    All other components within  normal limits  URINE MICROSCOPIC-ADD ON - Abnormal; Notable for the following:    Bacteria, UA FEW (*)    All other components within normal limits  CBC - Abnormal; Notable for the following:    WBC 16.8 (*)    All other components within normal limits  COMPREHENSIVE METABOLIC PANEL - Abnormal; Notable for the following:    Chloride 100 (*)    Glucose, Bld 401 (*)    Calcium 8.8 (*)    Total Protein 6.3 (*)    Albumin 3.3 (*)    AST 14 (*)    ALT 13 (*)    All other components within normal limits  URINE CULTURE  PREGNANCY, URINE    Imaging Review Ct Abdomen Pelvis Wo  Contrast  10/28/2014  CLINICAL DATA:  Acute onset of left flank pain for 5 hours. Hematuria. Malodorous urine and leukocytosis. Dysuria. Initial encounter. EXAM: CT ABDOMEN AND PELVIS WITHOUT CONTRAST TECHNIQUE: Multidetector CT imaging of the abdomen and pelvis was performed following the standard protocol without IV contrast. COMPARISON:  Pelvic ultrasound performed 01/05/2009, and CT of the abdomen and pelvis from 09/07/2004 FINDINGS: The visualized lung bases are clear. The liver and spleen are unremarkable in appearance. The gallbladder is within normal limits. The pancreas and adrenal glands are unremarkable. There appears to be minimal left-sided hydronephrosis, though no obstructing stone is identified. This may reflect a recently passed stone, or possibly mild underlying pyelonephritis and ureteritis. Evaluation for infection is limited without contrast, and due to lack of retroperitoneal fat. A 2.4 cm cyst is noted at the lower pole of the left kidney. No nonobstructing renal stones are identified. No free fluid is identified. The small bowel is unremarkable in appearance. The stomach is within normal limits. No acute vascular abnormalities are seen. The appendix is normal in caliber and contains air, without evidence of appendicitis. The colon is partially filled with stool and is unremarkable in appearance. The  bladder is mildly distended and grossly unremarkable. The intrauterine device is noted at the fundus of the uterus. The uterus is grossly unremarkable, though difficult to fully assess without contrast. The ovaries are relatively symmetric, with a small left-sided follicle noted. No suspicious adnexal masses are seen. No inguinal lymphadenopathy is seen. No acute osseous abnormalities are identified. IMPRESSION: 1. Apparent minimal left-sided hydronephrosis, though no obstructing ureteral stone is seen. This may reflect a recently passed stone, or possibly mild underlying pyelonephritis and ureteritis. Evaluation for infection is limited without contrast. 2. Small left renal cyst noted. Electronically Signed   By: Garald Balding M.D.   On: 10/28/2014 23:33   I have personally reviewed and evaluated these lab results as part of my medical decision-making.   EKG Interpretation None     Medications  cefTRIAXone (ROCEPHIN) 1 G injection (not administered)  sodium chloride 0.9 % bolus 1,000 mL (0 mLs Intravenous Stopped 10/29/14 0016)  cefTRIAXone (ROCEPHIN) 1 g in dextrose 5 % 50 mL IVPB (0 g Intravenous Stopped 10/29/14 0016)  HYDROcodone-acetaminophen (NORCO/VICODIN) 5-325 MG per tablet 1 tablet (1 tablet Oral Given 10/29/14 0015)    MDM   Final diagnoses:  Pyelonephritis   29yo F w/ IDDM who p/w a week of intermittent dysuria and foul-smelling urine as well as 1 day of left flank pain. Patient uncomfortable but nontoxic at presentation. She was mildly tachycardic but afebrile. Left CVA tenderness noted, no abdominal tenderness. Gave the patient an IV fluid bolus and Norco and obtained above labs which were notable for WBC 16.8, UA nitrite and leukocyte positive consistent with infection, blood glucose 401 with normal anion gap. Gave the patient ceftriaxone and urine culture. The patient has significant left-sided tenderness on exam and given her underlying diabetes, obtained CT of the abdomen and  pelvis to evaluate for acute renal pathology such as abscess versus infected ureteral stone. CT showed mild left-sided hydronephrosis but no stone. On reexamination after receiving above medications including ceftriaxone, the patient was resting comfortably. Provided her with Keflex, Lortab, and Zofran to treat pyelonephritis at home. Extensively reviewed return precautions and emphasized importance of follow-up with PCP. Patient voiced understanding and was discharged in satisfactory condition.  I personally performed the services described in this documentation, which was scribed in my presence. The recorded information has  been reviewed and is accurate.   Sharlett Iles, MD 10/29/14 [phone]

## 2014-10-28 NOTE — ED Notes (Signed)
MD at bedside. 

## 2014-10-29 MED ORDER — HYDROCODONE-ACETAMINOPHEN 5-325 MG PO TABS
1.0000 | ORAL_TABLET | Freq: Once | ORAL | Status: AC
  Administered 2014-10-29: 1 via ORAL
  Filled 2014-10-29: qty 1

## 2014-10-29 MED ORDER — ONDANSETRON 4 MG PO TBDP: 4.0000 mg | ORAL_TABLET | Freq: Three times a day (TID) | ORAL | Status: DC | PRN

## 2014-10-29 MED ORDER — CEPHALEXIN 500 MG PO CAPS: 500.0000 mg | ORAL_CAPSULE | Freq: Two times a day (BID) | ORAL | Status: DC

## 2014-10-29 MED ORDER — HYDROCODONE-ACETAMINOPHEN 5-325 MG PO TABS: 1.0000 | ORAL_TABLET | ORAL | Status: DC | PRN

## 2014-10-29 NOTE — Discharge Instructions (Signed)

## 2014-10-29 NOTE — ED Notes (Signed)
MD at bedside. 

## 2014-10-31 LAB — URINE CULTURE

## 2014-11-02 ENCOUNTER — Telehealth (HOSPITAL_COMMUNITY): Payer: Self-pay

## 2014-11-02 NOTE — Telephone Encounter (Signed)
Post ED Visit - Positive Culture Follow-up  Culture report reviewed by antimicrobial stewardship pharmacist:  []  Heide Guile, Pharm.D., BCPS []  Alycia Rossetti, Pharm.D., BCPS []  [place], [place].D., BCPS, AAHIVP [x]  Legrand Como, Pharm.D., BCPS, AAHIVP []  [place], Pharm.D. []  Milus Glazier, Pharm.D.  Positive urine culture, >/= 100,000 colonies -> E Coli Treated with Cephalexin, organism sensitive to the same and no further patient follow-up is required at this time.  Dortha Kern 11/02/2014, 4:45 AM

## 2015-04-01 ENCOUNTER — Emergency Department (HOSPITAL_BASED_OUTPATIENT_CLINIC_OR_DEPARTMENT_OTHER): Payer: Medicaid Other

## 2015-04-01 ENCOUNTER — Inpatient Hospital Stay (HOSPITAL_BASED_OUTPATIENT_CLINIC_OR_DEPARTMENT_OTHER)
Admission: EM | Admit: 2015-04-01 | Discharge: 2015-04-04 | DRG: 690 | Disposition: A | Discharge status: Home / Self Care | Payer: Medicaid Other | Attending: Internal Medicine | Admitting: Internal Medicine

## 2015-04-01 ENCOUNTER — Encounter (HOSPITAL_BASED_OUTPATIENT_CLINIC_OR_DEPARTMENT_OTHER): Payer: Self-pay | Admitting: Emergency Medicine

## 2015-04-01 DIAGNOSIS — Z79899 Other long term (current) drug therapy: Secondary | ICD-10-CM

## 2015-04-01 DIAGNOSIS — R739 Hyperglycemia, unspecified: Secondary | ICD-10-CM

## 2015-04-01 DIAGNOSIS — E860 Dehydration: Secondary | ICD-10-CM | POA: Diagnosis present

## 2015-04-01 DIAGNOSIS — R3129 Other microscopic hematuria: Secondary | ICD-10-CM | POA: Diagnosis present

## 2015-04-01 DIAGNOSIS — Z9109 Other allergy status, other than to drugs and biological substances: Secondary | ICD-10-CM

## 2015-04-01 DIAGNOSIS — Z85828 Personal history of other malignant neoplasm of skin: Secondary | ICD-10-CM

## 2015-04-01 DIAGNOSIS — Z87440 Personal history of urinary (tract) infections: Secondary | ICD-10-CM

## 2015-04-01 DIAGNOSIS — Z8659 Personal history of other mental and behavioral disorders: Secondary | ICD-10-CM

## 2015-04-01 DIAGNOSIS — Z794 Long term (current) use of insulin: Secondary | ICD-10-CM

## 2015-04-01 DIAGNOSIS — N136 Pyonephrosis: Principal | ICD-10-CM | POA: Diagnosis present

## 2015-04-01 DIAGNOSIS — R109 Unspecified abdominal pain: Secondary | ICD-10-CM

## 2015-04-01 DIAGNOSIS — M797 Fibromyalgia: Secondary | ICD-10-CM | POA: Diagnosis present

## 2015-04-01 DIAGNOSIS — F419 Anxiety disorder, unspecified: Secondary | ICD-10-CM | POA: Diagnosis present

## 2015-04-01 DIAGNOSIS — E10649 Type 1 diabetes mellitus with hypoglycemia without coma: Secondary | ICD-10-CM | POA: Diagnosis present

## 2015-04-01 DIAGNOSIS — E109 Type 1 diabetes mellitus without complications: Secondary | ICD-10-CM

## 2015-04-01 DIAGNOSIS — N1330 Unspecified hydronephrosis: Secondary | ICD-10-CM | POA: Diagnosis present

## 2015-04-01 DIAGNOSIS — F5000 Anorexia nervosa, unspecified: Secondary | ICD-10-CM | POA: Diagnosis present

## 2015-04-01 DIAGNOSIS — N179 Acute kidney failure, unspecified: Secondary | ICD-10-CM | POA: Diagnosis present

## 2015-04-01 DIAGNOSIS — Z91041 Radiographic dye allergy status: Secondary | ICD-10-CM

## 2015-04-01 DIAGNOSIS — G8929 Other chronic pain: Secondary | ICD-10-CM | POA: Diagnosis present

## 2015-04-01 DIAGNOSIS — N135 Crossing vessel and stricture of ureter without hydronephrosis: Secondary | ICD-10-CM | POA: Diagnosis present

## 2015-04-01 DIAGNOSIS — E1065 Type 1 diabetes mellitus with hyperglycemia: Secondary | ICD-10-CM | POA: Diagnosis present

## 2015-04-01 DIAGNOSIS — F17200 Nicotine dependence, unspecified, uncomplicated: Secondary | ICD-10-CM | POA: Diagnosis present

## 2015-04-01 DIAGNOSIS — Z8249 Family history of ischemic heart disease and other diseases of the circulatory system: Secondary | ICD-10-CM

## 2015-04-01 DIAGNOSIS — I10 Essential (primary) hypertension: Secondary | ICD-10-CM | POA: Diagnosis present

## 2015-04-01 LAB — CBG MONITORING, ED: Glucose-Capillary: 406 mg/dL — ABNORMAL HIGH (ref 65–99)

## 2015-04-01 LAB — COMPREHENSIVE METABOLIC PANEL
ALK PHOS: 169 U/L — AB (ref 38–126)
ALT: 15 U/L (ref 14–54)
ANION GAP: 9 (ref 5–15)
AST: 14 U/L — ABNORMAL LOW (ref 15–41)
Albumin: 3.1 g/dL — ABNORMAL LOW (ref 3.5–5.0)
BILIRUBIN TOTAL: 0.4 mg/dL (ref 0.3–1.2)
BUN: 18 mg/dL (ref 6–20)
CALCIUM: 8.9 mg/dL (ref 8.9–10.3)
CO2: 27 mmol/L (ref 22–32)
CREATININE: 1.27 mg/dL — AB (ref 0.44–1.00)
Chloride: 95 mmol/L — ABNORMAL LOW (ref 101–111)
GFR calc Af Amer: >60 mL/min (ref >60)
GFR calc non Af Amer: 56 mL/min — ABNORMAL LOW (ref >60)
GLUCOSE: 753 mg/dL — AB (ref 65–99)
Potassium: 3.9 mmol/L (ref 3.5–5.1)
Sodium: 131 mmol/L — ABNORMAL LOW (ref 135–145)
TOTAL PROTEIN: 6.6 g/dL (ref 6.5–8.1)

## 2015-04-01 LAB — CBC
HCT: 37.6 % (ref 36.0–46.0)
HEMOGLOBIN: 12.5 g/dL (ref 12.0–15.0)
MCH: 26.6 pg (ref 26.0–34.0)
MCHC: 33.2 g/dL (ref 30.0–36.0)
MCV: 80 fL (ref 78.0–100.0)
PLATELETS: 253 10*3/uL (ref 150–400)
RBC: 4.7 MIL/uL (ref 3.87–5.11)
RDW: 12.5 % (ref 11.5–15.5)
WBC: 9 10*3/uL (ref 4.0–10.5)

## 2015-04-01 LAB — URINE MICROSCOPIC-ADD ON

## 2015-04-01 LAB — URINALYSIS, ROUTINE W REFLEX MICROSCOPIC
BILIRUBIN URINE: NEGATIVE
Glucose, UA: >1000 mg/dL — AB
Ketones, ur: NEGATIVE mg/dL
Leukocytes, UA: NEGATIVE
NITRITE: NEGATIVE
PROTEIN: 100 mg/dL — AB
SPECIFIC GRAVITY, URINE: 1.024 (ref 1.005–1.030)
pH: 7.5 (ref 5.0–8.0)

## 2015-04-01 LAB — LIPASE, BLOOD: Lipase: 31 U/L (ref 11–51)

## 2015-04-01 LAB — PREGNANCY, URINE: PREG TEST UR: NEGATIVE

## 2015-04-01 MED ORDER — MORPHINE SULFATE (PF) 4 MG/ML IV SOLN
4.0000 mg | Freq: Once | INTRAVENOUS | Status: AC
  Administered 2015-04-01: 4 mg via INTRAVENOUS
  Filled 2015-04-01: qty 1

## 2015-04-01 MED ORDER — SODIUM CHLORIDE 0.9 % IV SOLN
INTRAVENOUS | Status: DC
  Administered 2015-04-01: 5.4 [IU]/h via INTRAVENOUS
  Filled 2015-04-01: qty 2.5

## 2015-04-01 MED ORDER — FENTANYL CITRATE (PF) 100 MCG/2ML IJ SOLN
50.0000 ug | INTRAMUSCULAR | Status: AC | PRN
  Administered 2015-04-01 – 2015-04-02 (×2): 50 ug via INTRAVENOUS
  Filled 2015-04-01 (×2): qty 2

## 2015-04-01 MED ORDER — POTASSIUM CHLORIDE 10 MEQ/100ML IV SOLN
10.0000 meq | INTRAVENOUS | Status: AC
Start: 2015-04-01 — End: 2015-04-01
  Administered 2015-04-01: 10 meq via INTRAVENOUS
  Filled 2015-04-01: qty 100

## 2015-04-01 MED ORDER — SODIUM CHLORIDE 0.9 % IV BOLUS (SEPSIS)
2000.0000 mL | Freq: Once | INTRAVENOUS | Status: AC
  Administered 2015-04-01: 2000 mL via INTRAVENOUS

## 2015-04-01 MED ORDER — ONDANSETRON HCL 4 MG/2ML IJ SOLN
4.0000 mg | Freq: Once | INTRAMUSCULAR | Status: AC | PRN
  Administered 2015-04-01: 4 mg via INTRAVENOUS
  Filled 2015-04-01: qty 2

## 2015-04-01 MED ORDER — DEXTROSE-NACL 5-0.45 % IV SOLN
INTRAVENOUS | Status: DC
  Administered 2015-04-02: 02:00:00 via INTRAVENOUS

## 2015-04-01 NOTE — ED Notes (Signed)
Pt states that about 30 mins ago pt started having pain in back that radiates into left side, vomiting present.

## 2015-04-01 NOTE — ED Notes (Signed)
Pt ask if she could call an ambulance to go to [facility], informed pt we can't call EMS to come get you at one hospital to take to another without doctor seeing you.  PT states she and her husband will go to [facility] in there own car.

## 2015-04-01 NOTE — ED Provider Notes (Signed)
CSN: [id number]     Arrival date & time 04/01/15  1936 History  By signing my name below, I, Emmanuella Mensah, attest that this documentation has been prepared under the direction and in the presence of Orlie Dakin, MD. Electronically Signed: Judithann Sauger, ED Scribe. 04/01/2015. 9:07 PM.     Chief Complaint  Patient presents with  . Flank Pain    The history is provided by the patient. No language interpreter was used.   HPI Comments: Tara Bullock is a 30 y.o. female with a hx of DM and fibromyalgia who presents to the Emergency Department complaining of gradually worsening mild left lateral lower quadrant pain that radiates to her Left flank onset on awakening from a nap 3 hours ago. She reports associated 4 episodes of vomiting no longer feels nauseated since treatment here. She states that moving makes the pain worse and lying still makes the pain better. Pain feels like kidney infection she's had in the past Pt did not have any medications PTA. She states that she is a current smoker and an occasional drinker. She denies any drug abuse. She states that she has had a kidney infection in the past with similar symptoms but not to this extent. No fever. Presently pain is mild to moderate since treatment here   Past Medical History  Diagnosis Date  . Diabetes mellitus   . Anxiety   . Skin cancer   . Fibromyalgia   . Anorexia nervosa    Past Surgical History  Procedure Laterality Date  . Other surgical history      cyst removed from face  . Back surgery     Family History  Problem Relation Age of Onset  . Hypertension Mother   . Hypertension Father    Social History  Substance Use Topics  . Smoking status: Current Every Day Smoker -- 1.00 packs/day  . Smokeless tobacco: None  . Alcohol Use: Yes     Comment: occasional   OB History    No data available     Review of Systems  Constitutional: Negative.  Negative for fever.  HENT: Negative.   Respiratory:  Negative.   Cardiovascular: Negative.   Gastrointestinal: Positive for nausea, vomiting and abdominal pain. Negative for diarrhea.  Genitourinary: Positive for flank pain.       Amenorrhea  Musculoskeletal: Positive for back pain.  Skin: Negative.   Allergic/Immunologic: Positive for immunocompromised state.       Diabetic  Neurological: Negative.   Psychiatric/Behavioral: Negative.   All other systems reviewed and are negative.     Allergies  Ivp dye and Adhesive  Home Medications   Prior to Admission medications   Medication Sig Start Date End Date Taking? Authorizing Provider  ALPRAZolam Duanne Moron) 0.5 MG tablet Take 0.5 mg by mouth daily as needed for sleep or anxiety.    Historical Provider, MD  cephALEXin (KEFLEX) 500 MG capsule Take 1 capsule (500 mg total) by mouth 2 (two) times daily. 10/29/14   Sharlett Iles, MD  CYMBALTA 30 MG capsule Take 30 mg by mouth daily.  10/26/13   Historical Provider, MD  fluticasone Asencion Islam ALLERGY RELIEF) 50 MCG/ACT nasal spray Place 1 spray into both nostrils daily. 04/01/14   Hilton Sinclair, MD  gabapentin (NEURONTIN) 300 MG capsule Take 600 mg by mouth 3 (three) times daily.  10/26/13   Historical Provider, MD  HYDROcodone-acetaminophen (NORCO/VICODIN) 5-325 MG tablet Take 1-2 tablets by mouth every 4 (four) hours as needed for severe  pain. 10/29/14   Sharlett Iles, MD  hyoscyamine (LEVSIN, ANASPAZ) 0.125 MG tablet Take 0.125 mg by mouth every 4 (four) hours as needed for cramping.    Historical Provider, MD  Insulin Regular Human (AFREZZA) 4 UNITS POWD Inhale 4-8 Units into the lungs daily. W/ largest meal of the day. Patient not taking: Reported on 05/20/2014 02/25/14   Barnie Del Cook, DO  LEVEMIR FLEXTOUCH 100 UNIT/ML Pen 22 Units daily. 08/27/13   Historical Provider, MD  levonorgestrel (MIRENA) 20 MCG/24HR IUD 1 each by Intrauterine route.    Historical Provider, MD  naproxen sodium (ANAPROX) 220 MG tablet Take 220 mg by  mouth as needed.    Historical Provider, MD  ondansetron (ZOFRAN ODT) 4 MG disintegrating tablet Take 1 tablet (4 mg total) by mouth every 8 (eight) hours as needed for nausea or vomiting. 10/29/14   Sharlett Iles, MD  promethazine (PHENERGAN) 12.5 MG tablet Take 12.5 mg by mouth daily as needed.     Historical Provider, MD  traMADol (ULTRAM) 50 MG tablet 3 times daily.  10/28/13   Historical Provider, MD   BP 130/102 mmHg  Pulse 97  Temp(Src) 97.9 F (36.6 C) (Oral)  Resp 24  Ht 5\' 1"  (1.549 m)  Wt 110 lb (49.896 kg)  BMI 20.80 kg/m2  SpO2 100% Physical Exam  Constitutional: She appears well-developed and well-nourished.  HENT:  Head: Normocephalic and atraumatic.  Mucous membranes dry  Eyes: Conjunctivae are normal. Pupils are equal, round, and reactive to light.  Neck: Neck supple. No tracheal deviation present. No thyromegaly present.  Cardiovascular: Normal rate and regular rhythm.   No murmur heard. Pulmonary/Chest: Effort normal and breath sounds normal.  Abdominal: Soft. Bowel sounds are normal. She exhibits no distension. There is no tenderness.  Genitourinary:  Left flank tender  Musculoskeletal: Normal range of motion. She exhibits no edema or tenderness.  Neurological: She is alert. Coordination normal.  Skin: Skin is warm and dry. No rash noted.  Psychiatric: She has a normal mood and affect.  Nursing note and vitals reviewed.   ED Course  Procedures (including critical care time) DIAGNOSTIC STUDIES: Oxygen Saturation is 100% on RA, normal by my interpretation.    COORDINATION OF CARE: 9:04 PM- Pt advised of plan for treatment and pt agrees.    Labs Review Labs Reviewed  CBC  LIPASE, BLOOD  COMPREHENSIVE METABOLIC PANEL  URINALYSIS, ROUTINE W REFLEX MICROSCOPIC (NOT AT [facility])  PREGNANCY, URINE    Imaging Review No results found.   Orlie Dakin, MD has personally reviewed and evaluated these images and lab results as part of his medical  decision-making.   EKG Interpretation None    Intravenous morphine and intravenous fluids ordered. A 22:35 PM she was requesting additional pain medicine. Additional intravenous morphine ordered. At 11:55 PM her pain is under control. Results for orders placed or performed during the hospital encounter of 04/01/15  Lipase, blood  Result Value Ref Range   Lipase 31 11 - 51 U/L  Comprehensive metabolic panel  Result Value Ref Range   Sodium 131 (L) 135 - 145 mmol/L   Potassium 3.9 3.5 - 5.1 mmol/L   Chloride 95 (L) 101 - 111 mmol/L   CO2 27 22 - 32 mmol/L   Glucose, Bld 753 (HH) 65 - 99 mg/dL   BUN 18 6 - 20 mg/dL   Creatinine, Ser 1.27 (H) 0.44 - 1.00 mg/dL   Calcium 8.9 8.9 - 10.3 mg/dL   Total Protein 6.6  6.5 - 8.1 g/dL   Albumin 3.1 (L) 3.5 - 5.0 g/dL   AST 14 (L) 15 - 41 U/L   ALT 15 14 - 54 U/L   Alkaline Phosphatase 169 (H) 38 - 126 U/L   Total Bilirubin 0.4 0.3 - 1.2 mg/dL   GFR calc non Af Amer 56 (L) >60 mL/min   GFR calc Af Amer >60 >60 mL/min   Anion gap 9 5 - 15  CBC  Result Value Ref Range   WBC 9.0 4.0 - 10.5 K/uL   RBC 4.70 3.87 - 5.11 MIL/uL   Hemoglobin 12.5 12.0 - 15.0 g/dL   HCT 37.6 36.0 - 46.0 %   MCV 80.0 78.0 - 100.0 fL   MCH 26.6 26.0 - 34.0 pg   MCHC 33.2 30.0 - 36.0 g/dL   RDW 12.5 11.5 - 15.5 %   Platelets 253 150 - 400 K/uL  Urinalysis, Routine w reflex microscopic (not at [facility])  Result Value Ref Range   Color, Urine YELLOW YELLOW   APPearance CLEAR CLEAR   Specific Gravity, Urine 1.024 1.005 - 1.030   pH 7.5 5.0 - 8.0   Glucose, UA >1000 (A) NEGATIVE mg/dL   Hgb urine dipstick MODERATE (A) NEGATIVE   Bilirubin Urine NEGATIVE NEGATIVE   Ketones, ur NEGATIVE NEGATIVE mg/dL   Protein, ur 100 (A) NEGATIVE mg/dL   Nitrite NEGATIVE NEGATIVE   Leukocytes, UA NEGATIVE NEGATIVE  Pregnancy, urine  Result Value Ref Range   Preg Test, Ur NEGATIVE NEGATIVE  Urine microscopic-add on  Result Value Ref Range   Squamous Epithelial / LPF 0-5 (A)  NONE SEEN   WBC, UA 0-5 0 - 5 WBC/hpf   RBC / HPF 6-30 0 - 5 RBC/hpf   Bacteria, UA MANY (A) NONE SEEN  CBG monitoring, ED  Result Value Ref Range   Glucose-Capillary >600 (HH) 65 - 99 mg/dL  CBG monitoring, ED  Result Value Ref Range   Glucose-Capillary 406 (H) 65 - 99 mg/dL   Ct Renal Stone Study  04/01/2015  ADDENDUM REPORT: 04/01/2015 22:41 ADDENDUM: Voice recognition errors throughout impression of initial report. Impression should be corrected to state: IMPRESSION: Enlargement of LEFT kidney with perinephric stranding and hydronephrosis though no definite ureteral dilatation or calculus identified. This could represent infection, passed calculus, or UPJ obstruction from non-visualized cause. Correlation with urinalysis recommended. Stable cyst at inferior pole LEFT kidney. Small amount of nonspecific free pelvic fluid, potentially physiologic. Corrections discussed with Dr. Winfred Leeds. Electronically Signed   By: Lavonia Dana M.D.   On: 04/01/2015 22:41  04/01/2015  CLINICAL DATA:  LEFT flank pain this afternoon, history of kidney infections, diabetes mellitus, smoker EXAM: CT ABDOMEN AND PELVIS WITHOUT CONTRAST TECHNIQUE: Multidetector CT imaging of the abdomen and pelvis was performed following the standard protocol without IV contrast. Sagittal and coronal MPR images reconstructed from axial data set. Oral contrast not administered. COMPARISON:  10/28/2014 FINDINGS: Lung bases clear. Small cyst inferior pole LEFT kidney 19 x 24 mm unchanged. Extrarenal pelves bilaterally. LEFT hydronephrosis increased since previous exam. Mild perinephric edema LEFT kidney. No definite urinary tract calcification or ureteral dilatation. Bladder and ureters normal appearance. Within limits of a nonenhanced exam unremarkable liver, spleen, pancreas, RIGHT kidney and adrenal glands. Normal appendix. IUD within uterus with uterus and adnexa otherwise unremarkable. Small amount of nonspecific free pelvic fluid in cul  de sac. Stool throughout colon. Stomach and bowel loops otherwise normal appearance. No mass, adenopathy, free air, hernia, or acute bone lesion.  IMPRESSION: Enlargement LEFT hip would perinephric achy and at cirrhosis though no definite ureteral dilatation or calculus identified ; recommend correlation with urinalysis. Stable cyst inferior pole LEFT kidney. Small amount of nonspecific free pelvic fluid, potentially physiologic. Electronically Signed: By: Lavonia Dana M.D. On: 04/01/2015 22:03     MDM  Intravenous insulin drip via glucose stabilizer ordered and intravenous normal saline with intravenous supplemental potassium ordered. Final diagnoses:  None  Patient likely suffering from ureteral colic. Suggest urologic consult. I consulted with Dr.Nikki carter from hospitalist service plan admit to stepdown unit, intravenous insulin, pain control, intravenous hydration Dx #1 hyperglycemia #2 acute kidney injury #3left flank pain #4 nausea and vomiting #5 dehydration #6 microscopic hematuria  I personally performed the services described in this documentation, which was scribed in my presence. The recorded information has been reviewed and considered. CRITICAL CARE Performed by: Orlie Dakin Total critical care time: 30 minutes Critical care time was exclusive of separately billable procedures and treating other patients. Critical care was necessary to treat or prevent imminent or life-threatening deterioration. Critical care was time spent personally by me on the following activities: development of treatment plan with patient and/or surrogate as well as nursing, discussions with consultants, evaluation of patient's response to treatment, examination of patient, obtaining history from patient or surrogate, ordering and performing treatments and interventions, ordering and review of laboratory studies, ordering and review of radiographic studies, pulse oximetry and re-evaluation of patient's  condition.     Orlie Dakin, MD 04/02/15 0002

## 2015-04-02 ENCOUNTER — Inpatient Hospital Stay (HOSPITAL_COMMUNITY): Payer: Medicaid Other | Admitting: Anesthesiology

## 2015-04-02 ENCOUNTER — Encounter (HOSPITAL_COMMUNITY): Payer: Self-pay | Admitting: Anesthesiology

## 2015-04-02 ENCOUNTER — Encounter (HOSPITAL_COMMUNITY)
Admission: EM | Disposition: A | Discharge status: Home / Self Care | Payer: Self-pay | Source: Home / Self Care | Attending: Internal Medicine

## 2015-04-02 DIAGNOSIS — R7309 Other abnormal glucose: Secondary | ICD-10-CM

## 2015-04-02 DIAGNOSIS — Z8659 Personal history of other mental and behavioral disorders: Secondary | ICD-10-CM | POA: Diagnosis not present

## 2015-04-02 DIAGNOSIS — F17200 Nicotine dependence, unspecified, uncomplicated: Secondary | ICD-10-CM | POA: Diagnosis present

## 2015-04-02 DIAGNOSIS — N1330 Unspecified hydronephrosis: Secondary | ICD-10-CM | POA: Diagnosis present

## 2015-04-02 DIAGNOSIS — F5000 Anorexia nervosa, unspecified: Secondary | ICD-10-CM | POA: Diagnosis present

## 2015-04-02 DIAGNOSIS — E10649 Type 1 diabetes mellitus with hypoglycemia without coma: Secondary | ICD-10-CM | POA: Diagnosis present

## 2015-04-02 DIAGNOSIS — E1065 Type 1 diabetes mellitus with hyperglycemia: Secondary | ICD-10-CM | POA: Diagnosis present

## 2015-04-02 DIAGNOSIS — E860 Dehydration: Secondary | ICD-10-CM | POA: Diagnosis present

## 2015-04-02 DIAGNOSIS — I10 Essential (primary) hypertension: Secondary | ICD-10-CM | POA: Diagnosis present

## 2015-04-02 DIAGNOSIS — E109 Type 1 diabetes mellitus without complications: Secondary | ICD-10-CM | POA: Diagnosis not present

## 2015-04-02 DIAGNOSIS — Z87440 Personal history of urinary (tract) infections: Secondary | ICD-10-CM | POA: Diagnosis not present

## 2015-04-02 DIAGNOSIS — Z9109 Other allergy status, other than to drugs and biological substances: Secondary | ICD-10-CM | POA: Diagnosis not present

## 2015-04-02 DIAGNOSIS — N179 Acute kidney failure, unspecified: Secondary | ICD-10-CM | POA: Diagnosis present

## 2015-04-02 DIAGNOSIS — M797 Fibromyalgia: Secondary | ICD-10-CM | POA: Diagnosis present

## 2015-04-02 DIAGNOSIS — F419 Anxiety disorder, unspecified: Secondary | ICD-10-CM | POA: Diagnosis present

## 2015-04-02 DIAGNOSIS — Z85828 Personal history of other malignant neoplasm of skin: Secondary | ICD-10-CM | POA: Diagnosis not present

## 2015-04-02 DIAGNOSIS — Z79899 Other long term (current) drug therapy: Secondary | ICD-10-CM | POA: Diagnosis not present

## 2015-04-02 DIAGNOSIS — Z794 Long term (current) use of insulin: Secondary | ICD-10-CM | POA: Diagnosis not present

## 2015-04-02 DIAGNOSIS — Z8249 Family history of ischemic heart disease and other diseases of the circulatory system: Secondary | ICD-10-CM | POA: Diagnosis not present

## 2015-04-02 DIAGNOSIS — R1032 Left lower quadrant pain: Secondary | ICD-10-CM | POA: Diagnosis present

## 2015-04-02 DIAGNOSIS — N135 Crossing vessel and stricture of ureter without hydronephrosis: Secondary | ICD-10-CM | POA: Diagnosis present

## 2015-04-02 DIAGNOSIS — R3129 Other microscopic hematuria: Secondary | ICD-10-CM | POA: Diagnosis present

## 2015-04-02 DIAGNOSIS — R739 Hyperglycemia, unspecified: Secondary | ICD-10-CM | POA: Diagnosis not present

## 2015-04-02 DIAGNOSIS — Z91041 Radiographic dye allergy status: Secondary | ICD-10-CM | POA: Diagnosis not present

## 2015-04-02 DIAGNOSIS — G8929 Other chronic pain: Secondary | ICD-10-CM | POA: Diagnosis present

## 2015-04-02 DIAGNOSIS — E1100 Type 2 diabetes mellitus with hyperosmolarity without nonketotic hyperglycemic-hyperosmolar coma (NKHHC): Secondary | ICD-10-CM | POA: Insufficient documentation

## 2015-04-02 DIAGNOSIS — N136 Pyonephrosis: Secondary | ICD-10-CM | POA: Diagnosis present

## 2015-04-02 HISTORY — PX: CYSTOSCOPY WITH STENT PLACEMENT: SHX5790

## 2015-04-02 LAB — BASIC METABOLIC PANEL
Anion gap: 9 (ref 5–15)
BUN: 16 mg/dL (ref 6–20)
CHLORIDE: 110 mmol/L (ref 101–111)
CO2: 23 mmol/L (ref 22–32)
Calcium: 7.8 mg/dL — ABNORMAL LOW (ref 8.9–10.3)
Creatinine, Ser: 0.89 mg/dL (ref 0.44–1.00)
GFR calc Af Amer: >60 mL/min (ref >60)
GFR calc non Af Amer: >60 mL/min (ref >60)
GLUCOSE: 162 mg/dL — AB (ref 65–99)
POTASSIUM: 3.6 mmol/L (ref 3.5–5.1)
SODIUM: 142 mmol/L (ref 135–145)

## 2015-04-02 LAB — GLUCOSE, CAPILLARY
GLUCOSE-CAPILLARY: 101 mg/dL — AB (ref 65–99)
GLUCOSE-CAPILLARY: 129 mg/dL — AB (ref 65–99)
GLUCOSE-CAPILLARY: 137 mg/dL — AB (ref 65–99)
GLUCOSE-CAPILLARY: 64 mg/dL — AB (ref 65–99)
GLUCOSE-CAPILLARY: 88 mg/dL (ref 65–99)
Glucose-Capillary: 147 mg/dL — ABNORMAL HIGH (ref 65–99)
Glucose-Capillary: 108 mg/dL — ABNORMAL HIGH (ref 65–99)
Glucose-Capillary: 55 mg/dL — ABNORMAL LOW (ref 65–99)
Glucose-Capillary: 72 mg/dL (ref 65–99)

## 2015-04-02 LAB — CBG MONITORING, ED
GLUCOSE-CAPILLARY: 106 mg/dL — AB (ref 65–99)
GLUCOSE-CAPILLARY: 208 mg/dL — AB (ref 65–99)
Glucose-Capillary: 109 mg/dL — ABNORMAL HIGH (ref 65–99)

## 2015-04-02 LAB — CBC
HEMATOCRIT: 32.6 % — AB (ref 36.0–46.0)
Hemoglobin: 11 g/dL — ABNORMAL LOW (ref 12.0–15.0)
MCH: 26.8 pg (ref 26.0–34.0)
MCHC: 33.7 g/dL (ref 30.0–36.0)
MCV: 79.3 fL (ref 78.0–100.0)
Platelets: 222 10*3/uL (ref 150–400)
RBC: 4.11 MIL/uL (ref 3.87–5.11)
RDW: 13 % (ref 11.5–15.5)
WBC: 20.9 10*3/uL — AB (ref 4.0–10.5)

## 2015-04-02 LAB — MRSA PCR SCREENING: MRSA BY PCR: NEGATIVE

## 2015-04-02 SURGERY — CYSTOSCOPY, WITH STENT INSERTION
Anesthesia: General | Site: Ureter | Laterality: Left

## 2015-04-02 MED ORDER — PROPOFOL 10 MG/ML IV BOLUS
INTRAVENOUS | Status: DC | PRN
  Administered 2015-04-02: 80 mg via INTRAVENOUS

## 2015-04-02 MED ORDER — FENTANYL CITRATE (PF) 100 MCG/2ML IJ SOLN
INTRAMUSCULAR | Status: AC
  Filled 2015-04-02: qty 2

## 2015-04-02 MED ORDER — CEFTRIAXONE SODIUM 1 G IJ SOLR
1.0000 g | Freq: Every day | INTRAMUSCULAR | Status: DC
  Administered 2015-04-02 – 2015-04-03 (×2): 1 g via INTRAVENOUS
  Filled 2015-04-02 (×3): qty 10

## 2015-04-02 MED ORDER — ONDANSETRON HCL 4 MG/2ML IJ SOLN
INTRAMUSCULAR | Status: DC | PRN
  Administered 2015-04-02: 4 mg via INTRAVENOUS

## 2015-04-02 MED ORDER — HYDROCODONE-ACETAMINOPHEN 5-325 MG PO TABS
1.0000 | ORAL_TABLET | ORAL | Status: DC | PRN
  Administered 2015-04-02: 2 via ORAL
  Administered 2015-04-03: 1 via ORAL
  Administered 2015-04-03 – 2015-04-04 (×7): 2 via ORAL
  Filled 2015-04-02 (×9): qty 2

## 2015-04-02 MED ORDER — LACTATED RINGERS IV SOLN: INTRAVENOUS | Status: DC

## 2015-04-02 MED ORDER — IOPAMIDOL (ISOVUE-300) INJECTION 61%
INTRAVENOUS | Status: AC
  Filled 2015-04-02: qty 50

## 2015-04-02 MED ORDER — GENTAMICIN IN SALINE 1.6-0.9 MG/ML-% IV SOLN: 80.0000 mg | INTRAVENOUS | Status: DC

## 2015-04-02 MED ORDER — INSULIN DETEMIR 100 UNIT/ML ~~LOC~~ SOLN
10.0000 [IU] | Freq: Every day | SUBCUTANEOUS | Status: DC
  Administered 2015-04-02 – 2015-04-04 (×3): 10 [IU] via SUBCUTANEOUS
  Filled 2015-04-02 (×3): qty 0.1

## 2015-04-02 MED ORDER — GENTAMICIN SULFATE 40 MG/ML IJ SOLN
5.0000 mg/kg | INTRAVENOUS | Status: AC
  Administered 2015-04-02: 250 mg via INTRAVENOUS
  Filled 2015-04-02: qty 6.25

## 2015-04-02 MED ORDER — MORPHINE SULFATE (PF) 2 MG/ML IV SOLN: 2.0000 mg | INTRAVENOUS | Status: DC | PRN

## 2015-04-02 MED ORDER — INSULIN ASPART 100 UNIT/ML ~~LOC~~ SOLN
0.0000 [IU] | SUBCUTANEOUS | Status: DC
  Administered 2015-04-02: 2 [IU] via SUBCUTANEOUS

## 2015-04-02 MED ORDER — FENTANYL CITRATE (PF) 100 MCG/2ML IJ SOLN: 25.0000 ug | INTRAMUSCULAR | Status: DC | PRN

## 2015-04-02 MED ORDER — INSULIN ASPART 100 UNIT/ML ~~LOC~~ SOLN
0.0000 [IU] | SUBCUTANEOUS | Status: DC
  Administered 2015-04-02 – 2015-04-03 (×3): 1 [IU] via SUBCUTANEOUS

## 2015-04-02 MED ORDER — SODIUM CHLORIDE 0.9 % IV SOLN: INTRAVENOUS | Status: DC

## 2015-04-02 MED ORDER — LACTATED RINGERS IV SOLN
INTRAVENOUS | Status: DC | PRN
Start: 2015-04-02 — End: 2015-04-02
  Administered 2015-04-02: 16:00:00 via INTRAVENOUS

## 2015-04-02 MED ORDER — ACETAMINOPHEN 325 MG PO TABS
650.0000 mg | ORAL_TABLET | Freq: Four times a day (QID) | ORAL | Status: DC | PRN
  Filled 2015-04-02: qty 2

## 2015-04-02 MED ORDER — ONDANSETRON HCL 4 MG PO TABS: 4.0000 mg | ORAL_TABLET | Freq: Four times a day (QID) | ORAL | Status: DC | PRN

## 2015-04-02 MED ORDER — SODIUM CHLORIDE 0.9 % IV SOLN
INTRAVENOUS | Status: DC
  Administered 2015-04-02: 01:00:00 via INTRAVENOUS

## 2015-04-02 MED ORDER — HYDROCODONE-ACETAMINOPHEN 5-325 MG PO TABS
1.0000 | ORAL_TABLET | Freq: Four times a day (QID) | ORAL | Status: DC | PRN
  Filled 2015-04-02: qty 1

## 2015-04-02 MED ORDER — ONDANSETRON HCL 4 MG/2ML IJ SOLN: 4.0000 mg | Freq: Four times a day (QID) | INTRAMUSCULAR | Status: DC | PRN

## 2015-04-02 MED ORDER — DEXTROSE 50 % IV SOLN
INTRAVENOUS | Status: AC
  Administered 2015-04-02 (×2): 25 mL
  Filled 2015-04-02: qty 50

## 2015-04-02 MED ORDER — ALPRAZOLAM 0.5 MG PO TABS
0.5000 mg | ORAL_TABLET | Freq: Every day | ORAL | Status: DC | PRN
  Filled 2015-04-02: qty 1

## 2015-04-02 MED ORDER — IOPAMIDOL (ISOVUE-300) INJECTION 61%
INTRAVENOUS | Status: DC | PRN
  Administered 2015-04-02: 8 mL via URETHRAL

## 2015-04-02 MED ORDER — MORPHINE SULFATE (PF) 2 MG/ML IV SOLN
2.0000 mg | INTRAVENOUS | Status: DC | PRN
  Administered 2015-04-02 – 2015-04-03 (×8): 2 mg via INTRAVENOUS
  Filled 2015-04-02 (×8): qty 1

## 2015-04-02 MED ORDER — PROPOFOL 10 MG/ML IV BOLUS
INTRAVENOUS | Status: AC
  Filled 2015-04-02: qty 20

## 2015-04-02 MED ORDER — DULOXETINE HCL 30 MG PO CPEP
30.0000 mg | ORAL_CAPSULE | Freq: Every day | ORAL | Status: DC
  Administered 2015-04-02: 30 mg via ORAL
  Filled 2015-04-02: qty 1

## 2015-04-02 MED ORDER — POTASSIUM CHLORIDE 10 MEQ/100ML IV SOLN
INTRAVENOUS | Status: AC
  Administered 2015-04-02: 10 meq
  Filled 2015-04-02: qty 100

## 2015-04-02 MED ORDER — TRAMADOL HCL 50 MG PO TABS
50.0000 mg | ORAL_TABLET | Freq: Three times a day (TID) | ORAL | Status: DC
  Administered 2015-04-02: 50 mg via ORAL
  Filled 2015-04-02: qty 1

## 2015-04-02 MED ORDER — CEFTRIAXONE SODIUM 1 G IJ SOLR
1.0000 g | Freq: Once | INTRAMUSCULAR | Status: AC
  Administered 2015-04-02: 1 g via INTRAVENOUS
  Filled 2015-04-02: qty 10

## 2015-04-02 MED ORDER — MORPHINE SULFATE (PF) 4 MG/ML IV SOLN
4.0000 mg | Freq: Once | INTRAVENOUS | Status: AC
  Administered 2015-04-02: 4 mg via INTRAVENOUS
  Filled 2015-04-02: qty 1

## 2015-04-02 MED ORDER — HYDRALAZINE HCL 20 MG/ML IJ SOLN
10.0000 mg | Freq: Four times a day (QID) | INTRAMUSCULAR | Status: DC | PRN
  Administered 2015-04-02 – 2015-04-03 (×2): 10 mg via INTRAVENOUS
  Filled 2015-04-02 (×2): qty 1

## 2015-04-02 MED ORDER — SODIUM CHLORIDE 0.9 % IV SOLN
INTRAVENOUS | Status: DC
  Administered 2015-04-02: 04:00:00 via INTRAVENOUS
  Administered 2015-04-02: 1000 mL via INTRAVENOUS
  Administered 2015-04-03: 75 mL/h via INTRAVENOUS

## 2015-04-02 MED ORDER — LACTATED RINGERS IV SOLN
INTRAVENOUS | Status: DC | PRN
  Administered 2015-04-02: 16:00:00 via INTRAVENOUS

## 2015-04-02 MED ORDER — ALBUTEROL SULFATE (2.5 MG/3ML) 0.083% IN NEBU: 2.5000 mg | INHALATION_SOLUTION | RESPIRATORY_TRACT | Status: DC | PRN

## 2015-04-02 MED ORDER — MIDAZOLAM HCL 5 MG/5ML IJ SOLN
INTRAMUSCULAR | Status: DC | PRN
  Administered 2015-04-02: 2 mg via INTRAVENOUS

## 2015-04-02 MED ORDER — ENOXAPARIN SODIUM 40 MG/0.4ML ~~LOC~~ SOLN
40.0000 mg | SUBCUTANEOUS | Status: DC
  Filled 2015-04-02 (×3): qty 0.4

## 2015-04-02 MED ORDER — HYDROCODONE-ACETAMINOPHEN 5-325 MG PO TABS
1.0000 | ORAL_TABLET | Freq: Four times a day (QID) | ORAL | Status: DC | PRN
  Administered 2015-04-02: 1 via ORAL
  Filled 2015-04-02: qty 1

## 2015-04-02 MED ORDER — GABAPENTIN 300 MG PO CAPS
600.0000 mg | ORAL_CAPSULE | Freq: Three times a day (TID) | ORAL | Status: DC
  Administered 2015-04-02 – 2015-04-04 (×6): 600 mg via ORAL
  Filled 2015-04-02 (×8): qty 2

## 2015-04-02 MED ORDER — SODIUM CHLORIDE 0.9 % IR SOLN
Status: DC | PRN
  Administered 2015-04-02: 3000 mL

## 2015-04-02 MED ORDER — KETOROLAC TROMETHAMINE 30 MG/ML IJ SOLN: 30.0000 mg | Freq: Once | INTRAMUSCULAR | Status: DC | PRN

## 2015-04-02 MED ORDER — HYDRALAZINE HCL 20 MG/ML IJ SOLN
10.0000 mg | Freq: Once | INTRAMUSCULAR | Status: AC
  Administered 2015-04-02: 10 mg via INTRAVENOUS
  Filled 2015-04-02: qty 1

## 2015-04-02 MED ORDER — LIDOCAINE HCL (CARDIAC) 20 MG/ML IV SOLN
INTRAVENOUS | Status: DC | PRN
  Administered 2015-04-02: 50 mg via INTRAVENOUS

## 2015-04-02 MED ORDER — FENTANYL CITRATE (PF) 100 MCG/2ML IJ SOLN
INTRAMUSCULAR | Status: DC | PRN
Start: 2015-04-02 — End: 2015-04-02
  Administered 2015-04-02: 25 ug via INTRAVENOUS

## 2015-04-02 MED ORDER — MIDAZOLAM HCL 2 MG/2ML IJ SOLN
INTRAMUSCULAR | Status: AC
Start: 2015-04-02 — End: 2015-04-02
  Filled 2015-04-02: qty 2

## 2015-04-02 SURGICAL SUPPLY — 15 items
BAG URINE DRAINAGE (UROLOGICAL SUPPLIES) ×3 IMPLANT
BAG URO CATCHER STRL LF (MISCELLANEOUS) ×3 IMPLANT
CATH FOLEY 2WAY SLVR  5CC 16FR (CATHETERS) ×2
CATH FOLEY 2WAY SLVR 5CC 16FR (CATHETERS) ×1 IMPLANT
CATH INTERMIT  6FR 70CM (CATHETERS) ×3 IMPLANT
CLOTH BEACON ORANGE TIMEOUT ST (SAFETY) ×3 IMPLANT
GLOVE BIOGEL M STRL SZ7.5 (GLOVE) ×3 IMPLANT
GOWN STRL REUS W/TWL LRG LVL3 (GOWN DISPOSABLE) ×6 IMPLANT
GUIDEWIRE ANG ZIPWIRE 038X150 (WIRE) IMPLANT
GUIDEWIRE STR DUAL SENSOR (WIRE) ×3 IMPLANT
MANIFOLD NEPTUNE II (INSTRUMENTS) ×3 IMPLANT
PACK CYSTO (CUSTOM PROCEDURE TRAY) ×3 IMPLANT
STENT CONTOUR 6FRX24X.038 (STENTS) ×3 IMPLANT
TUBING CONNECTING 10 (TUBING) ×2 IMPLANT
TUBING CONNECTING 10' (TUBING) ×1

## 2015-04-02 NOTE — ED Notes (Signed)
MD at bedside discussing test results and dispo plan of care.  Pt had requested pain med and MD has ordered it for her.

## 2015-04-02 NOTE — Progress Notes (Signed)
[facility] Progress Note                                                                                                                                                                                                                      Patient Demographics:    Tara Bullock, is a 30 y.o. female, DOB - 04-02-1985, [id number]  Admit date - 04/01/2015   Admitting Physician Lily Kocher, MD  Outpatient Primary MD for the patient is ANDY,CAMILLE L, MD  LOS - 0  Outpatient Specialists:   Chief Complaint  Patient presents with  . Flank Pain        Subjective:    Chrisanne Pinkert today has, No headache, No chest pain,  No Nausea, No new weakness tingling or numbness, No Cough - SOB. Does have left-sided low back/flank pain   Assessment  & Plan :     1.Left-sided back/flank pain with left-sided hydronephrosis, suspicion for LP G Stone versus passed stone with UTI. Leukocytosis is worse, for now continue Rocephin, follow cultures, continue IV fluids and pain control. Keep nothing by mouth. Have consulted urology 7 AM in the morning.  2. Ongoing smoking. Counseled to quit.   3. Chronic pain. On home tramadol which will be continued after discharge.   4. History of anorexia and anxiety. Supportive care for now.   5. Hyperglycemia in a patient with type 1 diabetes mellitus. No DKA. Check A1c, continue Levimir at a lower dose since she is nothing by mouth along with sliding scale.  Lab Results  Component Value Date   HGBA1C 13.8* 09/16/2011   CBG (last 3)   Recent Labs  04/02/15 0227 04/02/15 0408 04/02/15 0749  GLUCAP 109* 147* 108*      Code Status : Full  Family Communication  : Husband  Disposition Plan  : Home in 1-2 days  Barriers For Discharge : Left hydronephrosis  Consults  :  Urology  Procedures  :   CT Abd with L hydronephrosis  DVT  Prophylaxis  :  Lovenox   Lab Results  Component Value Date   PLT 222 04/02/2015    Antibiotics  :    Anti-infectives    Start     Dose/Rate Route Frequency Ordered Stop   04/02/15 2200  cefTRIAXone (ROCEPHIN) 1 g in dextrose 5 % 50 mL IVPB     1 g 100 mL/hr over 30 Minutes Intravenous Daily at bedtime 04/02/15 0347     04/02/15 0400  cefTRIAXone (ROCEPHIN) 1  g in dextrose 5 % 50 mL IVPB     1 g 100 mL/hr over 30 Minutes Intravenous  Once 04/02/15 0348 04/02/15 0523        Objective:   Filed Vitals:   04/02/15 0600 04/02/15 0700 04/02/15 0800 04/02/15 0900  BP: 141/87  176/98   Pulse: 98 104 110 106  Temp:   97.9 F (36.6 C)   TempSrc:   Oral   Resp: 11 21 17 16   Height:      Weight:      SpO2: 99% 100% 100% 98%    Wt Readings from Last 3 Encounters:  04/01/15 49.896 kg (110 lb)  10/28/14 51.71 kg (114 lb)  05/20/14 51.302 kg (113 lb 1.6 oz)     Intake/Output Summary (Last 24 hours) at 04/02/15 1029 Last data filed at 04/02/15 0900  Gross per 24 hour  Intake    500 ml  Output      0 ml  Net    500 ml     Physical Exam  Awake Alert, Oriented X 3, No new F.N deficits, Normal affect [place].AT,PERRAL Supple Neck,No JVD, No cervical lymphadenopathy appriciated.  Symmetrical Chest wall movement, Good air movement bilaterally, CTAB RRR,No Gallops,Rubs or new Murmurs, No Parasternal Heave +ve B.Sounds, Abd Soft, Mild L Flank tenderness, No organomegaly appriciated, No rebound - guarding or rigidity. No Cyanosis, Clubbing or edema, No new Rash or bruise       Data Review:    CBC  Recent Labs Lab 04/01/15 2045 04/02/15 0607  WBC 9.0 20.9*  HGB 12.5 11.0*  HCT 37.6 32.6*  PLT 253 222  MCV 80.0 79.3  MCH 26.6 26.8  MCHC 33.2 33.7  RDW 12.5 13.0    Chemistries   Recent Labs Lab 04/01/15 2045 04/02/15 0607  NA 131* 142  K 3.9 3.6  CL 95* 110  CO2 27 23  GLUCOSE 753* 162*  BUN 18 16  CREATININE 1.27* 0.89  CALCIUM 8.9 7.8*  AST 14*  --     ALT 15  --   ALKPHOS 169*  --   BILITOT 0.4  --    ------------------------------------------------------------------------------------------------------------------ No results for input(s): CHOL, HDL, LDLCALC, TRIG, CHOLHDL, LDLDIRECT in the last 72 hours.  Lab Results  Component Value Date   HGBA1C 13.8* 09/16/2011   CBG (last 3)   Recent Labs  04/02/15 0227 04/02/15 0408 04/02/15 0749  GLUCAP 109* 147* 108*     ------------------------------------------------------------------------------------------------------------------ No results for input(s): TSH, T4TOTAL, T3FREE, THYROIDAB in the last 72 hours.  Invalid input(s): FREET3 ------------------------------------------------------------------------------------------------------------------ No results for input(s): VITAMINB12, FOLATE, FERRITIN, TIBC, IRON, RETICCTPCT in the last 72 hours.  Coagulation profile No results for input(s): INR, PROTIME in the last 168 hours.  No results for input(s): DDIMER in the last 72 hours.  Cardiac Enzymes No results for input(s): CKMB, TROPONINI, MYOGLOBIN in the last 168 hours.  Invalid input(s): CK ------------------------------------------------------------------------------------------------------------------ No results found for: BNP  Inpatient Medications  Scheduled Meds: . cefTRIAXone (ROCEPHIN)  IV  1 g Intravenous QHS  . DULoxetine  30 mg Oral Daily  . enoxaparin (LOVENOX) injection  40 mg Subcutaneous Q24H  . gabapentin  600 mg Oral TID  . insulin aspart  0-9 Units Subcutaneous 6 times per day  . insulin detemir  10 Units Subcutaneous Daily   Continuous Infusions: . sodium chloride 75 mL/hr at 04/02/15 0426   PRN Meds:.albuterol, ALPRAZolam, hydrALAZINE, HYDROcodone-acetaminophen, morphine injection, [DISCONTINUED] ondansetron **OR** ondansetron (ZOFRAN) IV  Micro Results  Recent Results (from the past 240 hour(s))  MRSA PCR Screening     Status: None    Collection Time: 04/02/15  3:24 AM  Result Value Ref Range Status   MRSA by PCR NEGATIVE NEGATIVE Final    Comment:        The GeneXpert MRSA Assay (FDA approved for NASAL specimens only), is one component of a comprehensive MRSA colonization surveillance program. It is not intended to diagnose MRSA infection nor to guide or monitor treatment for MRSA infections.     Radiology Reports Ct Renal Laren Everts  04/01/2015  ADDENDUM REPORT: 04/01/2015 22:41 ADDENDUM: Voice recognition errors throughout impression of initial report. Impression should be corrected to state: IMPRESSION: Enlargement of LEFT kidney with perinephric stranding and hydronephrosis though no definite ureteral dilatation or calculus identified. This could represent infection, passed calculus, or UPJ obstruction from non-visualized cause. Correlation with urinalysis recommended. Stable cyst at inferior pole LEFT kidney. Small amount of nonspecific free pelvic fluid, potentially physiologic. Corrections discussed with Dr. Winfred Leeds. Electronically Signed   By: Lavonia Dana M.D.   On: 04/01/2015 22:41  04/01/2015  CLINICAL DATA:  LEFT flank pain this afternoon, history of kidney infections, diabetes mellitus, smoker EXAM: CT ABDOMEN AND PELVIS WITHOUT CONTRAST TECHNIQUE: Multidetector CT imaging of the abdomen and pelvis was performed following the standard protocol without IV contrast. Sagittal and coronal MPR images reconstructed from axial data set. Oral contrast not administered. COMPARISON:  10/28/2014 FINDINGS: Lung bases clear. Small cyst inferior pole LEFT kidney 19 x 24 mm unchanged. Extrarenal pelves bilaterally. LEFT hydronephrosis increased since previous exam. Mild perinephric edema LEFT kidney. No definite urinary tract calcification or ureteral dilatation. Bladder and ureters normal appearance. Within limits of a nonenhanced exam unremarkable liver, spleen, pancreas, RIGHT kidney and adrenal glands. Normal appendix.  IUD within uterus with uterus and adnexa otherwise unremarkable. Small amount of nonspecific free pelvic fluid in cul de sac. Stool throughout colon. Stomach and bowel loops otherwise normal appearance. No mass, adenopathy, free air, hernia, or acute bone lesion. IMPRESSION: Enlargement LEFT hip would perinephric achy and at cirrhosis though no definite ureteral dilatation or calculus identified ; recommend correlation with urinalysis. Stable cyst inferior pole LEFT kidney. Small amount of nonspecific free pelvic fluid, potentially physiologic. Electronically Signed: By: Lavonia Dana M.D. On: 04/01/2015 22:03    Time Spent in minutes   30   SINGH,PRASHANT K M.D on 04/02/2015 at 10:29 AM  Between 7am to 7pm - Pager - [phone]  After 7pm go to [email or web address] - password [facility]  [facility] -  Office  [phone]

## 2015-04-02 NOTE — H&P (Signed)
[facility] History and Physical  Tara Bullock [id number] DOB: [birth year] DOA: 04/01/2015  Referring physician: [facility] PCP: Tara Arnt, MD   Chief Complaint: Left-sided abdominal pain  HPI:  Tara Bullock is a 30 year old female with a past medical history of diabetes mellitus, anorexia nervosa, anxiety, fibromyalgia; who presents with left lateral lower quadrant abdominal pain. Symptoms started acutely waking patient from a nap yesterday evening. Reports pain 10 out of 10. Patient had associated symptoms of nausea and vomiting at least 3 times since the onset of symptoms. Any kind of movement made symptoms worse. She denies taking anything to try and alleviate symptoms prior to arrival. Reports symptoms feel similar to previous urinary tract infections. Denies any fever, chills, shortness of breath, or chest pain. Her diabetes is well controlled and she notes that she has only taken her Levemir as directed 4 days of the last 7. She complains of intermittent hypoglycemic episodes. Upon review of patient's last office noted appears that she was scheduled to see endocrinology for intermittent hyper and hypoglycemic episodes on May 16. Currently only on Levemir as they're trying to slowly introduce long and short-acting insulins.   Upon admission to the [facility] emergency department patient was found to have a blood glucose of 753, sodium 131, potassium 3.9, CO2 27, anion gap 9, alkaline phosphatase 169. UA showed many bacteria, negative nitrate, negative leukocytes,  moderate hemoglobin, 0-5 squamous epithelial cells, and 0-5 WBCs. A CT scan of the patient's abdomen revealed signs of hydronephrosis of the left kidney with perinephric stranding. [facility] consulted to admit the patient and have seen by urology in a.m. Patient had been initially started on a insulin drip per the glucose stabilizer protocol, but prior transport patient's insulin drip was stopped been off for at least 1-2 hours with  blood sugar noted to be 140 on arrival.  Review of Systems  Constitutional: Negative for fever and chills.  HENT: Negative for ear discharge, ear pain and hearing loss.   Eyes: Negative for photophobia and pain.  Respiratory: Negative for hemoptysis and sputum production.   Cardiovascular: Negative for chest pain, palpitations and orthopnea.  Gastrointestinal: Positive for nausea, vomiting and abdominal pain. Negative for constipation and blood in stool.  Genitourinary: Positive for frequency and flank pain. Negative for urgency.  Musculoskeletal: Positive for back pain. Negative for neck pain.  Skin: Negative for itching and rash.  Neurological: Negative for sensory change and speech change.  Endo/Heme/Allergies: Positive for environmental allergies. Does not bruise/bleed easily.  Psychiatric/Behavioral: Negative for hallucinations.       Past Medical History  Diagnosis Date  . Diabetes mellitus   . Anxiety   . Skin cancer   . Fibromyalgia   . Anorexia nervosa      Past Surgical History  Procedure Laterality Date  . Other surgical history      cyst removed from face  . Back surgery        Social History:  reports that she has been smoking.  She does not have any smokeless tobacco history on file. She reports that she drinks alcohol. She reports that she does not use illicit drugs. Where does patient live--home Can patient participate in ADLs? Yes  Allergies  Allergen Reactions  . Ivp Dye [Iodinated Diagnostic Agents] Anaphylaxis and Hives  . Adhesive [Tape] Hives and Rash    Family History  Problem Relation Age of Onset  . Hypertension Mother   . Hypertension Father      Prior to Admission medications  Medication Sig Start Date End Date Taking? Authorizing Provider  ALPRAZolam Tara Bullock) 0.5 MG tablet Take 0.5 mg by mouth daily as needed for sleep or anxiety.    Historical Provider, MD  cephALEXin (KEFLEX) 500 MG capsule Take 1 capsule (500 mg total) by mouth  2 (two) times daily. 10/29/14   Sharlett Iles, MD  CYMBALTA 30 MG capsule Take 30 mg by mouth daily.  10/26/13   Historical Provider, MD  fluticasone Asencion Islam ALLERGY RELIEF) 50 MCG/ACT nasal spray Place 1 spray into both nostrils daily. 04/01/14   Hilton Sinclair, MD  gabapentin (NEURONTIN) 300 MG capsule Take 600 mg by mouth 3 (three) times daily.  10/26/13   Historical Provider, MD  HYDROcodone-acetaminophen (NORCO/VICODIN) 5-325 MG tablet Take 1-2 tablets by mouth every 4 (four) hours as needed for severe pain. 10/29/14   Sharlett Iles, MD  hyoscyamine (LEVSIN, ANASPAZ) 0.125 MG tablet Take 0.125 mg by mouth every 4 (four) hours as needed for cramping.    Historical Provider, MD  Insulin Regular Human (AFREZZA) 4 UNITS POWD Inhale 4-8 Units into the lungs daily. W/ largest meal of the day. Patient not taking: Reported on 05/20/2014 02/25/14   Barnie Del Cook, DO  LEVEMIR FLEXTOUCH 100 UNIT/ML Pen 22 Units daily. 08/27/13   Historical Provider, MD  levonorgestrel (MIRENA) 20 MCG/24HR IUD 1 each by Intrauterine route.    Historical Provider, MD  naproxen sodium (ANAPROX) 220 MG tablet Take 220 mg by mouth as needed.    Historical Provider, MD  ondansetron (ZOFRAN ODT) 4 MG disintegrating tablet Take 1 tablet (4 mg total) by mouth every 8 (eight) hours as needed for nausea or vomiting. 10/29/14   Sharlett Iles, MD  promethazine (PHENERGAN) 12.5 MG tablet Take 12.5 mg by mouth daily as needed.     Historical Provider, MD  traMADol (ULTRAM) 50 MG tablet 3 times daily.  10/28/13   Historical Provider, MD     Physical Exam: Filed Vitals:   04/02/15 0030 04/02/15 0100 04/02/15 0130 04/02/15 0200  BP: 144/96 135/94 131/88 133/88  Pulse: 96 102 102 103  Temp:      TempSrc:      Resp:      Height:      Weight:      SpO2: 100% 97% 98% 98%     Constitutional: Vital signs reviewed. Patient is a well-developed and well-nourished in no acute distress and resting during  examination. Head: Normocephalic and atraumatic  Ear: TM normal bilaterally  Mouth: no erythema or exudates, Dry mucous membranes Eyes: PERRL, EOMI, conjunctivae normal, No scleral icterus.  Neck: Supple, Trachea midline normal ROM, No JVD, mass, thyromegaly, or carotid bruit present.  Cardiovascular: Tachycardic Pulmonary/Chest: CTAB, no wheezes, rales, or rhonchi  Abdominal: Soft. Tenderness to palpation of the left quadrants of the abdomen, positive bowel sounds in all 4 quadrants, and no rebound or guarding. GU: Positive for left sided CVA tenderness  Musculoskeletal: No joint deformities, erythema, or stiffness, ROM full and no nontender Ext: no edema and no cyanosis, pulses palpable bilaterally (DP and PT)  Hematology: no cervical, inginal, or axillary adenopathy.  Neurological: A&O x3, Strenght is normal and symmetric bilaterally, cranial nerve II-XII are grossly intact, no focal motor deficit, sensory intact to light touch bilaterally.  Skin: Warm, dry and intact. No rash, cyanosis, or clubbing.  Psychiatric: Normal mood and affect. speech and behavior is normal. Judgment and thought content normal. Cognition and memory are normal.      Data  Review   Micro Results No results found for this or any previous visit (from the past 240 hour(s)).  Radiology Reports Ct Renal Laren Everts  04/01/2015  ADDENDUM REPORT: 04/01/2015 22:41 ADDENDUM: Voice recognition errors throughout impression of initial report. Impression should be corrected to state: IMPRESSION: Enlargement of LEFT kidney with perinephric stranding and hydronephrosis though no definite ureteral dilatation or calculus identified. This could represent infection, passed calculus, or UPJ obstruction from non-visualized cause. Correlation with urinalysis recommended. Stable cyst at inferior pole LEFT kidney. Small amount of nonspecific free pelvic fluid, potentially physiologic. Corrections discussed with Dr. Winfred Leeds.  Electronically Signed   By: Lavonia Dana M.D.   On: 04/01/2015 22:41  04/01/2015  CLINICAL DATA:  LEFT flank pain this afternoon, history of kidney infections, diabetes mellitus, smoker EXAM: CT ABDOMEN AND PELVIS WITHOUT CONTRAST TECHNIQUE: Multidetector CT imaging of the abdomen and pelvis was performed following the standard protocol without IV contrast. Sagittal and coronal MPR images reconstructed from axial data set. Oral contrast not administered. COMPARISON:  10/28/2014 FINDINGS: Lung bases clear. Small cyst inferior pole LEFT kidney 19 x 24 mm unchanged. Extrarenal pelves bilaterally. LEFT hydronephrosis increased since previous exam. Mild perinephric edema LEFT kidney. No definite urinary tract calcification or ureteral dilatation. Bladder and ureters normal appearance. Within limits of a nonenhanced exam unremarkable liver, spleen, pancreas, RIGHT kidney and adrenal glands. Normal appendix. IUD within uterus with uterus and adnexa otherwise unremarkable. Small amount of nonspecific free pelvic fluid in cul de sac. Stool throughout colon. Stomach and bowel loops otherwise normal appearance. No mass, adenopathy, free air, hernia, or acute bone lesion. IMPRESSION: Enlargement LEFT hip would perinephric achy and at cirrhosis though no definite ureteral dilatation or calculus identified ; recommend correlation with urinalysis. Stable cyst inferior pole LEFT kidney. Small amount of nonspecific free pelvic fluid, potentially physiologic. Electronically Signed: By: Lavonia Dana M.D. On: 04/01/2015 22:03     CBC  Recent Labs Lab 04/01/15 2045  WBC 9.0  HGB 12.5  HCT 37.6  PLT 253  MCV 80.0  MCH 26.6  MCHC 33.2  RDW 12.5    Chemistries   Recent Labs Lab 04/01/15 2045  NA 131*  K 3.9  CL 95*  CO2 27  GLUCOSE 753*  BUN 18  CREATININE 1.27*  CALCIUM 8.9  AST 14*  ALT 15  ALKPHOS 169*  BILITOT 0.4    ------------------------------------------------------------------------------------------------------------------ estimated creatinine clearance is 49.3 mL/min (by C-G formula based on Cr of 1.27). ------------------------------------------------------------------------------------------------------------------ No results for input(s): HGBA1C in the last 72 hours. ------------------------------------------------------------------------------------------------------------------ No results for input(s): CHOL, HDL, LDLCALC, TRIG, CHOLHDL, LDLDIRECT in the last 72 hours. ------------------------------------------------------------------------------------------------------------------ No results for input(s): TSH, T4TOTAL, T3FREE, THYROIDAB in the last 72 hours.  Invalid input(s): FREET3 ------------------------------------------------------------------------------------------------------------------ No results for input(s): VITAMINB12, FOLATE, FERRITIN, TIBC, IRON, RETICCTPCT in the last 72 hours.  Coagulation profile No results for input(s): INR, PROTIME in the last 168 hours.  No results for input(s): DDIMER in the last 72 hours.  Cardiac Enzymes No results for input(s): CKMB, TROPONINI, MYOGLOBIN in the last 168 hours.  Invalid input(s): CK ------------------------------------------------------------------------------------------------------------------ Invalid input(s): POCBNP   CBG:  Recent Labs Lab 04/01/15 2151 04/01/15 2315 04/02/15 0024 04/02/15 0128 04/02/15 0227  GLUCAP >600* 406* 208* 106* 109*        Assessment/Plan Hyperglycemia in Type I diabetic: Acute. Patient presented with initial blood glucose 753, but was found to have no significant anion gap or signs of ketones. Patient initially started on insulin drip but appears  insulin drip was stopped prior to transport leaving a gap of 1-2 hours with recheck blood sugar 140. - Admit to stepdown bed - NPO  -  Change IV fluids to normal saline for now will add D5 to needed - CBGs every 4 hours with sensitive a sliding scale insulin - Levemir 10 units subcutaneous 1 dose now - hypoglycemic protocols - Will likely need to readjust Insulin regimen - Patient scheduled to see endocrinology May 16 for intermittent hypo-and hyperglycemic episode   Left hydronephrosis: Acute. Left sided hydronephrosis with perinephric stranding of the kidney present. - Will consult urology in a.m. - Fentanyl when necessary severe pain  Question of possible urinary tract infection: UA was questionable as it showed many bacteria but negative for nitrites, negative for leukocytes, moderate blood, 0-5 squamous epithelial cells, and 0-5 WBCs. - Follow-up urine culture - Placed on empiric antibiotics of Rocephin for the possibility of a underlying infection   Tobacco abuse -Will need counseling on the need of cessation of tobacco  Anorexia - Continue to monitor  Anxiety  - Xanax prn    Code Status:   full Family Communication: bedside Disposition Plan: admit   Total time spent 55 minutes.Greater than 50% of this time was spent in counseling, explanation of diagnosis, planning of further management, and coordination of care  [facility] Pager [phone]  If 7PM-7AM, please contact night-coverage [email or web address] Password [email or web address] 04/02/2015, 4:04 AM

## 2015-04-02 NOTE — Op Note (Signed)
Date of procedure: 04/02/2015  Preoperative diagnosis:  1. Left pyelonephritis 2. Chronic left UPJ obstruction  Postoperative diagnosis:  1. Left pyelonephritis 2. Chronic left UPJ obstruction   Procedure: 1. Cystoscopy 2. Left retrograde pyelogram with interpretation 3. Left ureteral stent placement with 6 French by 24 cm left double-J ureteral stent  Surgeon: Baruch Gouty, MD  Anesthesia: General  Complications: None  Intraoperative findings: The patient had a mild UPJ obstruction. Her kidneys drained well on fluoroscopy after stent placement.  EBL: None  Specimens: None  Drains: 6 French by 24 cm left double-J ureteral stent, 16 French Foley  Disposition: Stable to the postanesthesia care unit  Indication for procedure: The patient is a 30 y.o. female with a chronic left UPJ obstruction and pyelonephritis who appears to be clinically worsening. She presents today for stent placement throughout urinary obstruction as a source of her symptoms.  After reviewing the management options for treatment, the patient elected to proceed with the above surgical procedure(s). We have discussed the potential benefits and risks of the procedure, side effects of the proposed treatment, the likelihood of the patient achieving the goals of the procedure, and any potential problems that might occur during the procedure or recuperation. Informed consent has been obtained.  Description of procedure: The patient was met in the preoperative area. All risks, benefits, and indications of the procedure were described in great detail. The patient consented to the procedure. Preoperative antibiotics were given. The patient was taken to the operative theater. General anesthesia was induced per the anesthesia service. The patient was then placed in the dorsal lithotomy position and prepped and draped in the usual sterile fashion. A preoperative timeout was called. A 21 French 30 cystoscope was inserted into  the patient's bladder per urethra atraumatically. Pan cystoscopy revealed the left ureteral orifice. A sensor wire was placed the level of the renal pelvis under fluoroscopy on the left side. An open-ended catheter was then placed over the sensor wire up to the renal pelvis. The sensor wire was removed. There is no evidence of a hydronephrotic drip. A low-pressure retrograde pyelogram showed a left UPJ obstruction that was mild in nature. Sensor wire was then advanced into the renal pelvis and the catheter removed. A 6 French by 24 cm double-J ureteral stent was then placed sensor wire removed. It was confirmed to be in the correct location a curl seen in the patient's renal pelvis on fluoroscopy and a curl seen in the patient's urinary bladder under conization. There is some mild amount of debris that drained from the kidney but no evidence of purulent discharge. The patient's bladder was then drained and a 24 French Foley catheter was placed to allow for maximal drainage of her upper collecting system overnight. The patient was woken from anesthesia and transferred in stable condition to the post anesthesia care unit.  Plan: The patient will return to the floor overnight. If she does well, we can remove her Foley tomorrow. She'll need to follow-up as an outpatient for discussion of removal of her left ureteral stent once she has a negative urine culture versus pyeloplasty.  Baruch Gouty, M.D.

## 2015-04-02 NOTE — Transfer of Care (Signed)
Immediate Anesthesia Transfer of Care Note  Patient: Tara Bullock  Procedure(s) Performed: Procedure(s): CYSTOSCOPY, RETROGRADE PYELOGRAM WITH LEFT STENT PLACEMENT (Left)  Patient Location: PACU  Anesthesia Type:General  Level of Consciousness:  sedated, patient cooperative and responds to stimulation  Airway & Oxygen Therapy:Patient Spontanous Breathing and Patient connected to face mask oxgen  Post-op Assessment:  Report given to PACU RN and Post -op Vital signs reviewed and stable  Post vital signs:  Reviewed and stable  Last Vitals:  Filed Vitals:   04/02/15 1400 04/02/15 1500  BP: 142/81   Pulse: 116 117  Temp:  37.6 C  Resp: 24 20    Complications: No apparent anesthesia complications

## 2015-04-02 NOTE — Anesthesia Procedure Notes (Signed)
Procedure Name: LMA Insertion Date/Time: 04/02/2015 3:48 PM Performed by: Anne Fu Pre-anesthesia Checklist: Patient identified, Emergency Drugs available, Suction available, Patient being monitored and Timeout performed Patient Re-evaluated:Patient Re-evaluated prior to inductionOxygen Delivery Method: Circle system utilized Preoxygenation: Pre-oxygenation with 100% oxygen Intubation Type: IV induction Ventilation: Mask ventilation without difficulty LMA: LMA inserted LMA Size: 4.0 Number of attempts: 1 Placement Confirmation: positive ETCO2 and breath sounds checked- equal and bilateral Tube secured with: Tape

## 2015-04-02 NOTE — ED Notes (Signed)
Second bag of Potassium 56meq not given.  Hanging it now.

## 2015-04-02 NOTE — Consult Note (Signed)
10:55 AM   Tara Bullock 10/25/1985 [id number]  Referring provider: Dr. Ronnie Derby  Chief Complaint  Patient presents with  . Flank Pain    HPI: The patient is a 30 year old female with a past medical history of diabetes, fibromyalgia, and mild left UPJ obstruction who presents to Hospital of left flank pain and hyperglycemia with a blood sugar of 730. On admission her white blood cell count was 9, however repeat draw shows a white blood cell count of 20. She notes that she is awake and with constant left flank pain similar to her last episode of pyelonephritis but more painful. She notes it is not intermittent but rather continuous. She describes this pain as sharp and not a constant dull. She denies fevers or dysuria. She is tachycardic but she is also hypertensive at this time. She is never seen a urologist before. She has no history of kidney stones. Her urinalysis shows greater than 1000 glucose. Negative nitrite and negative leukocytes. Serial 5 squamous cells. Many bacteria. 0-5 WBC. 6-30 RBC.   On my review of her CT scan from today, she has a mild left hydronephrosis without hydroureteronephrosis. This is similar in nature to her CT scan in October 2016. Her kidney is enlarged consistent with pyelonephritis.   PMH: Past Medical History  Diagnosis Date  . Diabetes mellitus   . Anxiety   . Skin cancer   . Fibromyalgia   . Anorexia nervosa     Surgical History: Past Surgical History  Procedure Laterality Date  . Other surgical history      cyst removed from face  . Back surgery      Home Medications:    Medication List    ASK your doctor about these medications        ALPRAZolam 0.5 MG tablet  Commonly known as:  XANAX  Take 0.5 mg by mouth daily as needed for sleep or anxiety.     cephALEXin 500 MG capsule  Commonly known as:  KEFLEX  Take 1 capsule (500 mg total) by mouth 2 (two) times daily.     CYMBALTA 30 MG capsule  Generic drug:  DULoxetine  Take  30 mg by mouth daily.     fluticasone 50 MCG/ACT nasal spray  Commonly known as:  FLONASE ALLERGY RELIEF  Place 1 spray into both nostrils daily.     gabapentin 300 MG capsule  Commonly known as:  NEURONTIN  Take 600 mg by mouth 3 (three) times daily.     HYDROcodone-acetaminophen 5-325 MG tablet  Commonly known as:  NORCO/VICODIN  Take 1-2 tablets by mouth every 4 (four) hours as needed for severe pain.     hyoscyamine 0.125 MG tablet  Commonly known as:  LEVSIN, ANASPAZ  Take 0.125 mg by mouth every 4 (four) hours as needed for cramping.     Insulin Regular Human 4 units Powd  Commonly known as:  AFREZZA  Inhale 4-8 Units into the lungs daily. W/ largest meal of the day.     LEVEMIR FLEXTOUCH 100 UNIT/ML Pen  Generic drug:  Insulin Detemir  22 Units daily.     levonorgestrel 20 MCG/24HR IUD  Commonly known as:  MIRENA  1 each by Intrauterine route.     naproxen sodium 220 MG tablet  Commonly known as:  ANAPROX  Take 220 mg by mouth as needed.     ondansetron 4 MG disintegrating tablet  Commonly known as:  ZOFRAN ODT  Take 1 tablet (4 mg total)  by mouth every 8 (eight) hours as needed for nausea or vomiting.     promethazine 12.5 MG tablet  Commonly known as:  PHENERGAN  Take 12.5 mg by mouth daily as needed.     traMADol 50 MG tablet  Commonly known as:  ULTRAM  3 times daily.        Allergies:  Allergies  Allergen Reactions  . Ivp Dye [Iodinated Diagnostic Agents] Anaphylaxis and Hives  . Adhesive [Tape] Hives and Rash    Family History: Family History  Problem Relation Age of Onset  . Hypertension Mother   . Hypertension Father     Social History:  reports that she has been smoking.  She does not have any smokeless tobacco history on file. She reports that she drinks alcohol. She reports that she does not use illicit drugs.  ROS: 12 point ROS negative except per HPI                                        Physical  Exam: BP 176/98 mmHg  Pulse 106  Temp(Src) 97.9 F (36.6 C) (Oral)  Resp 16  Ht 5\' 1"  (1.549 m)  Wt 110 lb (49.896 kg)  BMI 20.80 kg/m2  SpO2 98%  Constitutional:  Alert and oriented, No acute distress. HEENT: [place] AT, moist mucus membranes.  Trachea midline, no masses. Cardiovascular: No clubbing, cyanosis, or edema. Respiratory: Normal respiratory effort, no increased work of breathing. GI: Abdomen is soft, nontender, nondistended, no abdominal masses GU: Left CVA tenderness palpation. No Foley. Skin: No rashes, bruises or suspicious lesions. Lymph: No cervical or inguinal adenopathy. Neurologic: Grossly intact, no focal deficits, moving all 4 extremities. Psychiatric: Normal mood and affect.  Laboratory Data: Lab Results  Component Value Date   WBC 20.9* 04/02/2015   HGB 11.0* 04/02/2015   HCT 32.6* 04/02/2015   MCV 79.3 04/02/2015   PLT 222 04/02/2015    Lab Results  Component Value Date   CREATININE 0.89 04/02/2015    No results found for: PSA  No results found for: TESTOSTERONE  Lab Results  Component Value Date   HGBA1C 13.8* 09/16/2011    Urinalysis    Component Value Date/Time   COLORURINE YELLOW 04/01/2015 2035   APPEARANCEUR CLEAR 04/01/2015 2035   LABSPEC 1.024 04/01/2015 2035   PHURINE 7.5 04/01/2015 2035   GLUCOSEU >1000* 04/01/2015 2035   HGBUR MODERATE* 04/01/2015 2035   BILIRUBINUR NEGATIVE 04/01/2015 2035   KETONESUR NEGATIVE 04/01/2015 2035   PROTEINUR 100* 04/01/2015 2035   UROBILINOGEN 0.2 10/28/2014 2045   NITRITE NEGATIVE 04/01/2015 2035   LEUKOCYTESUR NEGATIVE 04/01/2015 2035    Pertinent Imaging: ADDENDUM REPORT: 04/01/2015 22:41 ADDENDUM: Voice recognition errors throughout impression of initial report. Impression should be corrected to state: IMPRESSION: Enlargement of LEFT kidney with perinephric stranding and hydronephrosis though no definite ureteral dilatation or calculus identified. This could represent infection,  passed calculus, or UPJ obstruction from non-visualized cause. Correlation with urinalysis recommended. Stable cyst at inferior pole LEFT kidney. Small amount of nonspecific free pelvic fluid, potentially physiologic. Corrections discussed with Dr. Winfred Leeds. Electronically Signed  By: Lavonia Dana M.D.  On: 04/01/2015 22:41     Study Result     CLINICAL DATA: LEFT flank pain this afternoon, history of kidney infections, diabetes mellitus, smoker  EXAM: CT ABDOMEN AND PELVIS WITHOUT CONTRAST  TECHNIQUE: Multidetector CT imaging of the abdomen and pelvis was performed  following the standard protocol without IV contrast. Sagittal and coronal MPR images reconstructed from axial data set. Oral contrast not administered.  COMPARISON: 10/28/2014  FINDINGS: Lung bases clear.  Small cyst inferior pole LEFT kidney 19 x 24 mm unchanged.  Extrarenal pelves bilaterally.  LEFT hydronephrosis increased since previous exam.  Mild perinephric edema LEFT kidney.  No definite urinary tract calcification or ureteral dilatation.  Bladder and ureters normal appearance.  Within limits of a nonenhanced exam unremarkable liver, spleen, pancreas, RIGHT kidney and adrenal glands.  Normal appendix.  IUD within uterus with uterus and adnexa otherwise unremarkable.  Small amount of nonspecific free pelvic fluid in cul de sac.  Stool throughout colon.  Stomach and bowel loops otherwise normal appearance.  No mass, adenopathy, free air, hernia, or acute bone lesion.  IMPRESSION: Enlargement LEFT hip would perinephric achy and at cirrhosis though no definite ureteral dilatation or calculus identified ; recommend correlation with urinalysis.  Stable cyst inferior pole LEFT kidney.  Small amount of nonspecific free pelvic fluid, potentially physiologic.      Assessment & Plan:   1. Left pyelonephritis 2. Chronic mild left hydronephrosis 3.  Hyperglycemia Based on the patient's symptoms as well as passed and present CT imaging, she appears to be suffering from pyelonephritis. She's had this mild hydronephrosis for at least 6 months. This may represent a mild chronic UPJ obstruction. For now, I would treat her with antibiotics pending culture and sensitivity results for hydronephrosis as well as optimizing her blood sugars. We will hold off on placing a left ureteral stent and this times appears to be chronic problem. Please inform urology if the patient's clinical status deteriorates as we may need to at that time re-address her chronic mild hydronephrosis. Will follow.    Nickie Retort, MD

## 2015-04-02 NOTE — Progress Notes (Signed)
Pharmacy Antibiotic Note  Tara Bullock is a 30 y.o. female admitted on 04/01/2015 with hyperglycemia, left hydronephrosis, and possible UTI.  Pharmacy has been consulted for Ceftriaxone dosing.  Plan: Ceftriaxone 1g IV q24h.  Dosage remains stable and need for further dosage adjustment appears unlikely at present.    Will sign off at this time.  Please reconsult if a change in clinical status warrants re-evaluation of dosage.  Ralene Bathe, PharmD, BCPS 04/02/2015, 9:19 AM  Pager: [phone]   Height: 5\' 1"  (154.9 cm) Weight: 110 lb (49.896 kg) IBW/kg (Calculated) : 47.8  Temp (24hrs), Avg:97.9 F (36.6 C), Min:97.9 F (36.6 C), Max:97.9 F (36.6 C)   Recent Labs Lab 04/01/15 2045 04/02/15 0607  WBC 9.0 20.9*  CREATININE 1.27* 0.89    Estimated Creatinine Clearance: 70.4 mL/min (by C-G formula based on Cr of 0.89).    Allergies  Allergen Reactions  . Ivp Dye [Iodinated Diagnostic Agents] Anaphylaxis and Hives  . Adhesive [Tape] Hives and Rash    Antimicrobials this admission: 4/1 >> CTX >>  Dose adjustments this admission:   Microbiology results: 4/1 BCx: Ordered 4/1 UCx: Ordered 4/1 MRSA PCR: Negative  Thank you for allowing pharmacy to be a part of this patient's care.

## 2015-04-02 NOTE — Anesthesia Preprocedure Evaluation (Addendum)
Anesthesia Evaluation  Patient identified by MRN, date of birth, ID band Patient awake    Reviewed: Allergy & Precautions, NPO status , Patient's Chart, lab work & pertinent test results  Airway Mallampati: II  TM Distance: >3 FB Neck ROM: Full    Dental no notable dental hx.    Pulmonary Current Smoker,    Pulmonary exam normal breath sounds clear to auscultation       Cardiovascular negative cardio ROS Normal cardiovascular exam Rhythm:Regular Rate:Normal     Neuro/Psych Anorexia Nervosa negative psych ROS   GI/Hepatic negative GI ROS, Neg liver ROS,   Endo/Other  diabetes, Insulin Dependent  Renal/GU negative Renal ROS  negative genitourinary   Musculoskeletal negative musculoskeletal ROS (+)   Abdominal   Peds negative pediatric ROS (+)  Hematology negative hematology ROS (+)   Anesthesia Other Findings   Reproductive/Obstetrics negative OB ROS                            Anesthesia Physical Anesthesia Plan  ASA: III  Anesthesia Plan: General   Post-op Pain Management:    Induction: Intravenous  Airway Management Planned: Oral ETT and LMA  Additional Equipment:   Intra-op Plan:   Post-operative Plan: Extubation in OR  Informed Consent: I have reviewed the patients History and Physical, chart, labs and discussed the procedure including the risks, benefits and alternatives for the proposed anesthesia with the patient or authorized representative who has indicated his/her understanding and acceptance.   Dental advisory given  Plan Discussed with: CRNA and Surgeon  Anesthesia Plan Comments:         Anesthesia Quick Evaluation

## 2015-04-02 NOTE — Progress Notes (Addendum)
When I reviewed the patient's chart this afternoon, I noticed that she spiked a temperature to over 102. Though waxing and waning fevers is common in pyelonephritis, this is her first time being febrile. I'm concerned as the patient appears to show signs of clinically worsening despite antibiotics. It is still unclear if the source of her symptoms are pyelonephritis or her chronic mild UPJ obstruction. At this point, though I feel we are obligated to place a left ureteral stent for maximal urinary drainage to rule out obstructive uropathy as a source for her clinical status.  I discussed this in great detail with her and her boyfriend. She understands the risks, benefits, and indications of this procedure. She understands the goal is to place a stent for maximal urinary drainage. She also understands that she will have a Foley catheter immediately postoperatively. We also discussed living with a ureteral stent and some of the side effects which include stent discomfort. We also discussed that she would be discharged home with a stent with the plan for outpatient removal after negative urine cultures. All of her questions were answered. She has elected to proceed.

## 2015-04-03 LAB — GLUCOSE, CAPILLARY
GLUCOSE-CAPILLARY: 91 mg/dL (ref 65–99)
Glucose-Capillary: 122 mg/dL — ABNORMAL HIGH (ref 65–99)
Glucose-Capillary: 127 mg/dL — ABNORMAL HIGH (ref 65–99)
Glucose-Capillary: 240 mg/dL — ABNORMAL HIGH (ref 65–99)
Glucose-Capillary: 135 mg/dL — ABNORMAL HIGH (ref 65–99)

## 2015-04-03 LAB — CBC
HEMATOCRIT: 31.1 % — AB (ref 36.0–46.0)
Hemoglobin: 10.4 g/dL — ABNORMAL LOW (ref 12.0–15.0)
MCH: 26.7 pg (ref 26.0–34.0)
MCHC: 33.4 g/dL (ref 30.0–36.0)
MCV: 79.7 fL (ref 78.0–100.0)
PLATELETS: 182 10*3/uL (ref 150–400)
RBC: 3.9 MIL/uL (ref 3.87–5.11)
RDW: 13.5 % (ref 11.5–15.5)
WBC: 16.6 10*3/uL — AB (ref 4.0–10.5)

## 2015-04-03 LAB — BASIC METABOLIC PANEL
ANION GAP: 10 (ref 5–15)
BUN: 15 mg/dL (ref 6–20)
CALCIUM: 7.4 mg/dL — AB (ref 8.9–10.3)
CO2: 18 mmol/L — AB (ref 22–32)
CREATININE: 1.01 mg/dL — AB (ref 0.44–1.00)
Chloride: 111 mmol/L (ref 101–111)
GFR calc Af Amer: >60 mL/min (ref >60)
GLUCOSE: 128 mg/dL — AB (ref 65–99)
Potassium: 4.2 mmol/L (ref 3.5–5.1)
Sodium: 139 mmol/L (ref 135–145)

## 2015-04-03 MED ORDER — DULOXETINE HCL 60 MG PO CPEP
90.0000 mg | ORAL_CAPSULE | Freq: Every day | ORAL | Status: DC
  Administered 2015-04-03 – 2015-04-04 (×2): 90 mg via ORAL
  Filled 2015-04-03: qty 1
  Filled 2015-04-03: qty 3

## 2015-04-03 MED ORDER — TRAMADOL HCL 50 MG PO TABS
100.0000 mg | ORAL_TABLET | Freq: Three times a day (TID) | ORAL | Status: DC
  Administered 2015-04-03 – 2015-04-04 (×4): 100 mg via ORAL
  Filled 2015-04-03 (×4): qty 2

## 2015-04-03 MED ORDER — INSULIN ASPART 100 UNIT/ML ~~LOC~~ SOLN
0.0000 [IU] | Freq: Three times a day (TID) | SUBCUTANEOUS | Status: DC
  Administered 2015-04-03: 3 [IU] via SUBCUTANEOUS
  Administered 2015-04-04 (×2): 2 [IU] via SUBCUTANEOUS

## 2015-04-03 MED ORDER — HYOSCYAMINE SULFATE 0.125 MG PO TABS
0.1250 mg | ORAL_TABLET | ORAL | Status: DC | PRN
  Filled 2015-04-03: qty 1

## 2015-04-03 MED ORDER — INSULIN ASPART 100 UNIT/ML ~~LOC~~ SOLN
4.0000 [IU] | Freq: Three times a day (TID) | SUBCUTANEOUS | Status: DC
  Administered 2015-04-03 – 2015-04-04 (×3): 4 [IU] via SUBCUTANEOUS

## 2015-04-03 NOTE — Progress Notes (Signed)
Unknown to staff patients mother had hooked the foley catheter bag on the bed rail due to "tubing being to tight", when lab came to draw blood and put down the bed rail patient screamed out. Assessed by this RN foley appears patent and continues to drain urine. No apparent urethral damage just pulling of the stat lock on leg. Patient and family educated that bag can not remain on railing. Will continue to monitor.

## 2015-04-03 NOTE — Progress Notes (Signed)
Pain improved overnight No events overnight No n/v  Filed Vitals:   04/03/15 0400 04/03/15 0500 04/03/15 0800 04/03/15 0900  BP:   124/79   Pulse: 123 115 99   Temp:    98.6 F (37 C)  TempSrc:    Axillary  Resp: 17 21 24    Height:      Weight:      SpO2: 99% 97% 100%    I/O last 3 completed shifts: In: 2409.3 [I.V.:2309.3; IV Piggyback:100] Out: 950 [Urine:950] Total I/O In: 315 [P.O.:240; I.V.:75] Out: 200 [Urine:200]   NAD Soft NT ND Foley clear yellow  CBC    Component Value Date/Time   WBC 16.6* 04/03/2015 0503   RBC 3.90 04/03/2015 0503   HGB 10.4* 04/03/2015 0503   HCT 31.1* 04/03/2015 0503   PLT 182 04/03/2015 0503   MCV 79.7 04/03/2015 0503   MCH 26.7 04/03/2015 0503   MCHC 33.4 04/03/2015 0503   RDW 13.5 04/03/2015 0503   LYMPHSABS 1.0 03/30/2012 0556   MONOABS 0.9 03/30/2012 0556   EOSABS 0.0 03/30/2012 0556   BASOSABS 0.0 03/30/2012 0556    BMP Latest Ref Rng 04/03/2015 04/02/2015 04/01/2015  Glucose 65 - 99 mg/dL 128(H) 162(H) 753(HH)  BUN 6 - 20 mg/dL 15 16 18   Creatinine 0.44 - 1.00 mg/dL 1.01(H) 0.89 1.27(H)  Sodium 135 - 145 mmol/L 139 142 131(L)  Potassium 3.5 - 5.1 mmol/L 4.2 3.6 3.9  Chloride 101 - 111 mmol/L 111 110 95(L)  CO2 22 - 32 mmol/L 18(L) 23 27  Calcium 8.9 - 10.3 mg/dL 7.4(L) 7.8(L) 8.9    POD 1 Left ureteral stent for Left UPJ obstruction, pyelonephritis -continue abx pending c/s results -d/c foley -no further urologic intervention at this time -patient needs to f/u as outpatient to discuss definitive management of stent/UPJ obstruction

## 2015-04-03 NOTE — Progress Notes (Signed)
[facility] PROGRESS NOTE  Tara Bullock [id number] DOB: 1985-01-26 DOA: 04/01/2015 PCP: Leamon Arnt, MD  Summary 30 year old female with a past medical history of diabetes mellitus, anorexia nervosa, anxiety, fibromyalgia; who presents with left lateral lower quadrant abdominal pain.  found to have a blood glucose of 753, sodium 131, potassium 3.9, CO2 27, anion gap 9, alkaline phosphatase 169. UA showed many bacteria, negative nitrate, negative leukocytes, moderate hemoglobin, 0-5 squamous epithelial cells, and 0-5 WBCs. A CT scan of the patient's abdomen revealed signs of hydronephrosis of the left kidney with perinephric stranding.   Assessment/Plan:  Principal Problem:   HONK: CBGs improved and on SQ insulin Active Problems:   DM type 1, not at goal [facility])   Anorexia nervosa   Pyelonephritis and Hydronephrosis, left: s/p UPJ stent: urology following. cuntures pending  Transfer to floor. mobilize   Code Status:  full Family Communication:  Pt is lucid Disposition Plan:  Home eventually  Consultants:  urology  Procedures:  1. Cystoscopy 2. Left retrograde pyelogram with interpretation 3. Left ureteral stent placement with 6 French by 24 cm left double-J ureteral stent  Antibiotics:  rocephin  HPI/Subjective: Nausea resolved. Feeling better  Objective: Filed Vitals:   04/03/15 0500 04/03/15 0800  BP:  124/79  Pulse: 115 99  Temp:    Resp: 21 24    Intake/Output Summary (Last 24 hours) at 04/03/15 0824 Last data filed at 04/03/15 0800  Gross per 24 hour  Intake 2059.25 ml  Output    950 ml  Net 1109.25 ml   Filed Weights   04/01/15 1947  Weight: 49.896 kg (110 lb)    Exam:   General:  Nontoxic. A and o  Cardiovascular: RRR without MGR  Respiratory: CTA without WRR  Abdomen: S, NT, ND  Ext: no CCE  Basic Metabolic Panel:  Recent Labs Lab 04/01/15 2045 04/02/15 0607 04/03/15 0503  NA 131* 142 139  K 3.9 3.6 4.2  CL  95* 110 111  CO2 27 23 18*  GLUCOSE 753* 162* 128*  BUN 18 16 15   CREATININE 1.27* 0.89 1.01*  CALCIUM 8.9 7.8* 7.4*   Liver Function Tests:  Recent Labs Lab 04/01/15 2045  AST 14*  ALT 15  ALKPHOS 169*  BILITOT 0.4  PROT 6.6  ALBUMIN 3.1*    Recent Labs Lab 04/01/15 2045  LIPASE 31   No results for input(s): AMMONIA in the last 168 hours. CBC:  Recent Labs Lab 04/01/15 2045 04/02/15 0607 04/03/15 0503  WBC 9.0 20.9* 16.6*  HGB 12.5 11.0* 10.4*  HCT 37.6 32.6* 31.1*  MCV 80.0 79.3 79.7  PLT 253 222 182   Cardiac Enzymes: No results for input(s): CKTOTAL, CKMB, CKMBINDEX, TROPONINI in the last 168 hours. BNP (last 3 results) No results for input(s): BNP in the last 8760 hours.  ProBNP (last 3 results) No results for input(s): PROBNP in the last 8760 hours.  CBG:  Recent Labs Lab 04/02/15 1631 04/02/15 1712 04/02/15 1745 04/02/15 2010 04/02/15 2347  GLUCAP 72 55* 88 101* 129*    Recent Results (from the past 240 hour(s))  MRSA PCR Screening     Status: None   Collection Time: 04/02/15  3:24 AM  Result Value Ref Range Status   MRSA by PCR NEGATIVE NEGATIVE Final    Comment:        The GeneXpert MRSA Assay (FDA approved for NASAL specimens only), is one component of a comprehensive MRSA colonization surveillance program. It is not intended  to diagnose MRSA infection nor to guide or monitor treatment for MRSA infections.      Studies: Ct Renal Stone Study  04/01/2015  ADDENDUM REPORT: 04/01/2015 22:41 ADDENDUM: Voice recognition errors throughout impression of initial report. Impression should be corrected to state: IMPRESSION: Enlargement of LEFT kidney with perinephric stranding and hydronephrosis though no definite ureteral dilatation or calculus identified. This could represent infection, passed calculus, or UPJ obstruction from non-visualized cause. Correlation with urinalysis recommended. Stable cyst at inferior pole LEFT kidney. Small  amount of nonspecific free pelvic fluid, potentially physiologic. Corrections discussed with Dr. Winfred Leeds. Electronically Signed   By: Lavonia Dana M.D.   On: 04/01/2015 22:41  04/01/2015  CLINICAL DATA:  LEFT flank pain this afternoon, history of kidney infections, diabetes mellitus, smoker EXAM: CT ABDOMEN AND PELVIS WITHOUT CONTRAST TECHNIQUE: Multidetector CT imaging of the abdomen and pelvis was performed following the standard protocol without IV contrast. Sagittal and coronal MPR images reconstructed from axial data set. Oral contrast not administered. COMPARISON:  10/28/2014 FINDINGS: Lung bases clear. Small cyst inferior pole LEFT kidney 19 x 24 mm unchanged. Extrarenal pelves bilaterally. LEFT hydronephrosis increased since previous exam. Mild perinephric edema LEFT kidney. No definite urinary tract calcification or ureteral dilatation. Bladder and ureters normal appearance. Within limits of a nonenhanced exam unremarkable liver, spleen, pancreas, RIGHT kidney and adrenal glands. Normal appendix. IUD within uterus with uterus and adnexa otherwise unremarkable. Small amount of nonspecific free pelvic fluid in cul de sac. Stool throughout colon. Stomach and bowel loops otherwise normal appearance. No mass, adenopathy, free air, hernia, or acute bone lesion. IMPRESSION: Enlargement LEFT hip would perinephric achy and at cirrhosis though no definite ureteral dilatation or calculus identified ; recommend correlation with urinalysis. Stable cyst inferior pole LEFT kidney. Small amount of nonspecific free pelvic fluid, potentially physiologic. Electronically Signed: By: Lavonia Dana M.D. On: 04/01/2015 22:03    Scheduled Meds: . cefTRIAXone (ROCEPHIN)  IV  1 g Intravenous QHS  . DULoxetine  30 mg Oral Daily  . enoxaparin (LOVENOX) injection  40 mg Subcutaneous Q24H  . gabapentin  600 mg Oral TID  . insulin aspart  0-9 Units Subcutaneous 6 times per day  . insulin detemir  10 Units Subcutaneous Daily    Continuous Infusions: . sodium chloride 75 mL/hr (04/03/15 0758)    Time spent: 35 minutes  [facility] [email or web address], password [facility] 04/03/2015, 8:24 AM  LOS: 1 day

## 2015-04-04 ENCOUNTER — Encounter (HOSPITAL_COMMUNITY): Payer: Self-pay | Admitting: Urology

## 2015-04-04 DIAGNOSIS — N1330 Unspecified hydronephrosis: Secondary | ICD-10-CM

## 2015-04-04 DIAGNOSIS — E109 Type 1 diabetes mellitus without complications: Secondary | ICD-10-CM

## 2015-04-04 DIAGNOSIS — R739 Hyperglycemia, unspecified: Secondary | ICD-10-CM

## 2015-04-04 LAB — CBC
HEMATOCRIT: 27.3 % — AB (ref 36.0–46.0)
HEMOGLOBIN: 9 g/dL — AB (ref 12.0–15.0)
MCH: 25.9 pg — AB (ref 26.0–34.0)
MCHC: 33 g/dL (ref 30.0–36.0)
MCV: 78.7 fL (ref 78.0–100.0)
Platelets: 179 10*3/uL (ref 150–400)
RBC: 3.47 MIL/uL — ABNORMAL LOW (ref 3.87–5.11)
RDW: 13.5 % (ref 11.5–15.5)
WBC: 11.5 10*3/uL — ABNORMAL HIGH (ref 4.0–10.5)

## 2015-04-04 LAB — URINE CULTURE: CULTURE: NO GROWTH

## 2015-04-04 LAB — GLUCOSE, CAPILLARY
GLUCOSE-CAPILLARY: 159 mg/dL — AB (ref 65–99)
Glucose-Capillary: 169 mg/dL — ABNORMAL HIGH (ref 65–99)

## 2015-04-04 LAB — HEMOGLOBIN A1C
Hgb A1c MFr Bld: 12.6 % — ABNORMAL HIGH (ref 4.8–5.6)
MEAN PLASMA GLUCOSE: 315 mg/dL

## 2015-04-04 MED ORDER — HYDROCODONE-ACETAMINOPHEN 5-325 MG PO TABS
1.0000 | ORAL_TABLET | ORAL | Status: DC | PRN
Start: 2015-04-04 — End: 2015-05-06

## 2015-04-04 MED ORDER — CEFUROXIME AXETIL 500 MG PO TABS
500.0000 mg | ORAL_TABLET | Freq: Two times a day (BID) | ORAL | Status: DC
  Filled 2015-04-04 (×2): qty 1

## 2015-04-04 MED ORDER — CEFUROXIME AXETIL 500 MG PO TABS: 500.0000 mg | ORAL_TABLET | Freq: Two times a day (BID) | ORAL | Status: DC

## 2015-04-04 NOTE — Anesthesia Postprocedure Evaluation (Signed)
Anesthesia Post Note  Patient: ZYAIRE MIETUS  Procedure(s) Performed: Procedure(s) (LRB): CYSTOSCOPY, RETROGRADE PYELOGRAM WITH LEFT STENT PLACEMENT (Left)  Patient location during evaluation: PACU Anesthesia Type: General Level of consciousness: awake and alert Pain management: pain level controlled Vital Signs Assessment: post-procedure vital signs reviewed and stable Respiratory status: spontaneous breathing, nonlabored ventilation, respiratory function stable and patient connected to nasal cannula oxygen Cardiovascular status: blood pressure returned to baseline and stable Postop Assessment: no signs of nausea or vomiting Anesthetic complications: no    Last Vitals:  Filed Vitals:   04/04/15 0549 04/04/15 1358  BP: 142/85 136/81  Pulse: 86 96  Temp: 36.8 C 37 C  Resp: 18 18    Last Pain:  Filed Vitals:   04/04/15 1415  PainSc: Asleep                 Kaidence Sant S

## 2015-04-04 NOTE — Discharge Summary (Signed)
Physician Discharge Summary  Tara Bullock [id number] DOB: 1985-09-07 DOA: 04/01/2015  PCP: Leamon Arnt, MD  Admit date: 04/01/2015 Discharge date: 04/04/2015  Time spent: 35 minutes  Recommendations for Outpatient Follow-up:  1. Urology follow up for stent removal   Discharge Diagnoses:  Principal Problem:   Hyperglycemia Active Problems:   DM type 1, not at goal [facility])   Anorexia nervosa   Hydronephrosis, left   Discharge Condition: improved  Diet recommendation: carb mod  Filed Weights   04/01/15 1947  Weight: 49.896 kg (110 lb)    History of present illness:  Tara Bullock is a 30 year old female with a past medical history of diabetes mellitus, anorexia nervosa, anxiety, fibromyalgia; who presents with left lateral lower quadrant abdominal pain. Symptoms started acutely waking patient from a nap yesterday evening. Reports pain 10 out of 10. Patient had associated symptoms of nausea and vomiting at least 3 times since the onset of symptoms. Any kind of movement made symptoms worse. She denies taking anything to try and alleviate symptoms prior to arrival. Reports symptoms feel similar to previous urinary tract infections. Denies any fever, chills, shortness of breath, or chest pain. Her diabetes is well controlled and she notes that she has only taken her Levemir as directed 4 days of the last 7. She complains of intermittent hypoglycemic episodes. Upon review of patient's last office noted appears that she was scheduled to see endocrinology for intermittent hyper and hypoglycemic episodes on May 16. Currently only on Levemir as they're trying to slowly introduce long and short-acting insulins.   Upon admission to the [facility] emergency department patient was found to have a blood glucose of 753, sodium 131, potassium 3.9, CO2 27, anion gap 9, alkaline phosphatase 169. UA showed many bacteria, negative nitrate, negative leukocytes, moderate hemoglobin, 0-5 squamous  epithelial cells, and 0-5 WBCs. A CT scan of the patient's abdomen revealed signs of hydronephrosis of the left kidney with perinephric stranding. [facility] consulted to admit the patient and have seen by urology in a.m. Patient had been initially started on a insulin drip per the glucose stabilizer protocol, but prior transport patient's insulin drip was stopped been off for at least 1-2 hours with blood sugar noted to be 140 on arrival.  Hospital Course:  HONK: CBGs improved and on SQ insulin   DM type 1, not at goal (HCC)-outpatient follow up  Anorexia nervosa  Pyelonephritis and Hydronephrosis, left: s/p UPJ stent: urology follow up outpatient. cultures with no growth-- appears to be done after abx-- since patient has responded to IV rocephin with treat for 12 days total with ceftin   Procedures:  1. Cystoscopy 2. Left retrograde pyelogram with interpretation 3. Left ureteral stent placement with 6 French by 24 cm left double-J ureteral stent  Consultations:  urology  Discharge Exam: Filed Vitals:   04/04/15 0549 04/04/15 1358  BP: 142/85 136/81  Pulse: 86 96  Temp: 98.2 F (36.8 C) 98.6 F (37 C)  Resp: 18 18    General: awake, NAD  Discharge Instructions   Discharge Instructions    Diet Carb Modified    Complete by:  As directed      Discharge instructions    Complete by:  As directed   Monitor blood sugars and bring to PCP Outpatient follow up with urology as instructed     Increase activity slowly    Complete by:  As directed           Current Discharge Medication List  START taking these medications   Details  cefUROXime (CEFTIN) 500 MG tablet Take 1 tablet (500 mg total) by mouth 2 (two) times daily with a meal. Qty: 24 tablet, Refills: 0      CONTINUE these medications which have CHANGED   Details  HYDROcodone-acetaminophen (NORCO/VICODIN) 5-325 MG tablet Take 1-2 tablets by mouth every 4 (four) hours as needed for moderate pain. Qty: 20 tablet,  Refills: 0      CONTINUE these medications which have NOT CHANGED   Details  ALPRAZolam (XANAX) 0.5 MG tablet Take 0.5-1 mg by mouth 2 (two) times daily as needed for sleep or anxiety.     CYMBALTA 30 MG capsule Take 30 mg by mouth daily.  Refills: 2    fluticasone (FLONASE ALLERGY RELIEF) 50 MCG/ACT nasal spray Place 1 spray into both nostrils daily. Qty: 16 g, Refills: 2   Associated Diagnoses: Allergic rhinitis, unspecified allergic rhinitis type    gabapentin (NEURONTIN) 300 MG capsule Take 600-900 mg by mouth 3 (three) times daily. Take 600mg  capsule in the morning, 600 mg in the afternoon and 900 mg at bedtime Refills: 1    hyoscyamine (LEVSIN, ANASPAZ) 0.125 MG tablet Take 0.125 mg by mouth every 4 (four) hours as needed for cramping.    insulin lispro (HUMALOG) 100 UNIT/ML injection Inject 4 Units into the skin 3 (three) times daily before meals.    LEVEMIR FLEXTOUCH 100 UNIT/ML Pen Inject 21 Units into the skin every morning.  Refills: 5    levonorgestrel (MIRENA) 20 MCG/24HR IUD 1 each by Intrauterine route.    ondansetron (ZOFRAN ODT) 4 MG disintegrating tablet Take 1 tablet (4 mg total) by mouth every 8 (eight) hours as needed for nausea or vomiting. Qty: 10 tablet, Refills: 0    promethazine (PHENERGAN) 12.5 MG tablet Take 12.5 mg by mouth daily as needed for nausea.     traMADol (ULTRAM) 50 MG tablet Take 100-150 mg by mouth 3 (three) times daily as needed for moderate pain. Take 100mg  in the morning, 100mg  in the afternoon and 150 mg at bedtime Refills: 0       Allergies  Allergen Reactions  . Ivp Dye [Iodinated Diagnostic Agents] Anaphylaxis and Hives  . Adhesive [Tape] Hives and Rash   Follow-up Information    Follow up with Nickie Retort, MD In 3 weeks.   Specialty:  Urology   Why:  urology - discuss stent/blocked kidney   Contact information:   [place] [address] [phone]       Follow up with ANDY,CAMILLE L, MD In 1 week.    Specialty:  Family Medicine   Contact information:   [place] [address] [phone]        The results of significant diagnostics from this hospitalization (including imaging, microbiology, ancillary and laboratory) are listed below for reference.    Significant Diagnostic Studies: Ct Renal Stone Study  04/01/2015  ADDENDUM REPORT: 04/01/2015 22:41 ADDENDUM: Voice recognition errors throughout impression of initial report. Impression should be corrected to state: IMPRESSION: Enlargement of LEFT kidney with perinephric stranding and hydronephrosis though no definite ureteral dilatation or calculus identified. This could represent infection, passed calculus, or UPJ obstruction from non-visualized cause. Correlation with urinalysis recommended. Stable cyst at inferior pole LEFT kidney. Small amount of nonspecific free pelvic fluid, potentially physiologic. Corrections discussed with Dr. Winfred Leeds. Electronically Signed   By: Lavonia Dana M.D.   On: 04/01/2015 22:41  04/01/2015  CLINICAL  DATA:  LEFT flank pain this afternoon, history of kidney infections, diabetes mellitus, smoker EXAM: CT ABDOMEN AND PELVIS WITHOUT CONTRAST TECHNIQUE: Multidetector CT imaging of the abdomen and pelvis was performed following the standard protocol without IV contrast. Sagittal and coronal MPR images reconstructed from axial data set. Oral contrast not administered. COMPARISON:  10/28/2014 FINDINGS: Lung bases clear. Small cyst inferior pole LEFT kidney 19 x 24 mm unchanged. Extrarenal pelves bilaterally. LEFT hydronephrosis increased since previous exam. Mild perinephric edema LEFT kidney. No definite urinary tract calcification or ureteral dilatation. Bladder and ureters normal appearance. Within limits of a nonenhanced exam unremarkable liver, spleen, pancreas, RIGHT kidney and adrenal glands. Normal appendix. IUD within uterus with uterus and adnexa otherwise unremarkable. Small  amount of nonspecific free pelvic fluid in cul de sac. Stool throughout colon. Stomach and bowel loops otherwise normal appearance. No mass, adenopathy, free air, hernia, or acute bone lesion. IMPRESSION: Enlargement LEFT hip would perinephric achy and at cirrhosis though no definite ureteral dilatation or calculus identified ; recommend correlation with urinalysis. Stable cyst inferior pole LEFT kidney. Small amount of nonspecific free pelvic fluid, potentially physiologic. Electronically Signed: By: Lavonia Dana M.D. On: 04/01/2015 22:03    Microbiology: Recent Results (from the past 240 hour(s))  MRSA PCR Screening     Status: None   Collection Time: 04/02/15  3:24 AM  Result Value Ref Range Status   MRSA by PCR NEGATIVE NEGATIVE Final    Comment:        The GeneXpert MRSA Assay (FDA approved for NASAL specimens only), is one component of a comprehensive MRSA colonization surveillance program. It is not intended to diagnose MRSA infection nor to guide or monitor treatment for MRSA infections.   Culture, blood (routine x 2)     Status: None (Preliminary result)   Collection Time: 04/02/15  7:50 AM  Result Value Ref Range Status   Specimen Description BLOOD RIGHT HAND  6 ML IN AEROBIC ONLY  Final   Special Requests NONE  Final   Culture   Final    NO GROWTH 2 DAYS Performed at [facility]    Report Status PENDING  Incomplete  Culture, blood (routine x 2)     Status: None (Preliminary result)   Collection Time: 04/02/15 12:15 PM  Result Value Ref Range Status   Specimen Description BLOOD RIGHT ARM  10 ML IN [facility] BOTTLE  Final   Special Requests ROCEPHIN ?  Final   Culture   Final    NO GROWTH 2 DAYS Performed at [facility]    Report Status PENDING  Incomplete  Culture, Urine     Status: None   Collection Time: 04/03/15 10:11 AM  Result Value Ref Range Status   Specimen Description URINE, CATHETERIZED  Final   Special Requests NONE  Final   Culture   Final     NO GROWTH 1 DAY Performed at [facility]    Report Status 04/04/2015 FINAL  Final     Labs: Basic Metabolic Panel:  Recent Labs Lab 04/01/15 2045 04/02/15 0607 04/03/15 0503  NA 131* 142 139  K 3.9 3.6 4.2  CL 95* 110 111  CO2 27 23 18*  GLUCOSE 753* 162* 128*  BUN 18 16 15   CREATININE 1.27* 0.89 1.01*  CALCIUM 8.9 7.8* 7.4*   Liver Function Tests:  Recent Labs Lab 04/01/15 2045  AST 14*  ALT 15  ALKPHOS 169*  BILITOT 0.4  PROT 6.6  ALBUMIN 3.1*  Recent Labs Lab 04/01/15 2045  LIPASE 31   No results for input(s): AMMONIA in the last 168 hours. CBC:  Recent Labs Lab 04/01/15 2045 04/02/15 0607 04/03/15 0503 04/04/15 0414  WBC 9.0 20.9* 16.6* 11.5*  HGB 12.5 11.0* 10.4* 9.0*  HCT 37.6 32.6* 31.1* 27.3*  MCV 80.0 79.3 79.7 78.7  PLT 253 222 182 179   Cardiac Enzymes: No results for input(s): CKTOTAL, CKMB, CKMBINDEX, TROPONINI in the last 168 hours. BNP: BNP (last 3 results) No results for input(s): BNP in the last 8760 hours.  ProBNP (last 3 results) No results for input(s): PROBNP in the last 8760 hours.  CBG:  Recent Labs Lab 04/03/15 1216 04/03/15 1701 04/03/15 2111 04/04/15 0737 04/04/15 1147  GLUCAP 240* 91 135* 169* 159*       Signed:  [facility] 04/04/2015, 2:24 PM

## 2015-04-07 LAB — CULTURE, BLOOD (ROUTINE X 2)
Culture: NO GROWTH
Culture: NO GROWTH

## 2015-04-18 ENCOUNTER — Emergency Department (HOSPITAL_COMMUNITY): Payer: Medicaid Other

## 2015-04-18 ENCOUNTER — Emergency Department (HOSPITAL_COMMUNITY)
Admission: EM | Admit: 2015-04-18 | Discharge: 2015-04-18 | Disposition: A | Discharge status: Home / Self Care | Payer: Medicaid Other | Attending: Emergency Medicine | Admitting: Emergency Medicine

## 2015-04-18 ENCOUNTER — Encounter (HOSPITAL_COMMUNITY): Payer: Self-pay | Admitting: Emergency Medicine

## 2015-04-18 DIAGNOSIS — R739 Hyperglycemia, unspecified: Secondary | ICD-10-CM

## 2015-04-18 DIAGNOSIS — M797 Fibromyalgia: Secondary | ICD-10-CM | POA: Diagnosis not present

## 2015-04-18 DIAGNOSIS — F17200 Nicotine dependence, unspecified, uncomplicated: Secondary | ICD-10-CM | POA: Diagnosis not present

## 2015-04-18 DIAGNOSIS — N390 Urinary tract infection, site not specified: Secondary | ICD-10-CM | POA: Diagnosis not present

## 2015-04-18 DIAGNOSIS — Z794 Long term (current) use of insulin: Secondary | ICD-10-CM | POA: Insufficient documentation

## 2015-04-18 DIAGNOSIS — Z85828 Personal history of other malignant neoplasm of skin: Secondary | ICD-10-CM | POA: Diagnosis not present

## 2015-04-18 DIAGNOSIS — F419 Anxiety disorder, unspecified: Secondary | ICD-10-CM | POA: Insufficient documentation

## 2015-04-18 DIAGNOSIS — Z79899 Other long term (current) drug therapy: Secondary | ICD-10-CM | POA: Insufficient documentation

## 2015-04-18 DIAGNOSIS — E1165 Type 2 diabetes mellitus with hyperglycemia: Secondary | ICD-10-CM | POA: Diagnosis not present

## 2015-04-18 DIAGNOSIS — R109 Unspecified abdominal pain: Secondary | ICD-10-CM | POA: Diagnosis present

## 2015-04-18 LAB — CBC WITH DIFFERENTIAL/PLATELET
BASOS ABS: 0.1 10*3/uL (ref 0.0–0.1)
BASOS PCT: 1 %
Eosinophils Absolute: 0.2 10*3/uL (ref 0.0–0.7)
Eosinophils Relative: 2 %
HEMATOCRIT: 35 % — AB (ref 36.0–46.0)
Hemoglobin: 11.7 g/dL — ABNORMAL LOW (ref 12.0–15.0)
Lymphocytes Relative: 31 %
Lymphs Abs: 3.3 10*3/uL (ref 0.7–4.0)
MCH: 26.3 pg (ref 26.0–34.0)
MCHC: 33.4 g/dL (ref 30.0–36.0)
MCV: 78.7 fL (ref 78.0–100.0)
MONO ABS: 0.5 10*3/uL (ref 0.1–1.0)
Monocytes Relative: 5 %
NEUTROS ABS: 6.7 10*3/uL (ref 1.7–7.7)
Neutrophils Relative %: 61 %
PLATELETS: 413 10*3/uL — AB (ref 150–400)
RBC: 4.45 MIL/uL (ref 3.87–5.11)
RDW: 13.2 % (ref 11.5–15.5)
WBC: 10.8 10*3/uL — AB (ref 4.0–10.5)

## 2015-04-18 LAB — BLOOD GAS, VENOUS
Acid-Base Excess: 1.8 mmol/L (ref 0.0–2.0)
Bicarbonate: 27.3 mEq/L — ABNORMAL HIGH (ref 20.0–24.0)
O2 Saturation: 55.2 %
PATIENT TEMPERATURE: 98.6
TCO2: 25.6 mmol/L (ref 0–100)
pCO2, Ven: 49.7 mmHg (ref 45.0–50.0)
pH, Ven: 7.359 — ABNORMAL HIGH (ref 7.250–7.300)

## 2015-04-18 LAB — URINE MICROSCOPIC-ADD ON

## 2015-04-18 LAB — URINALYSIS, ROUTINE W REFLEX MICROSCOPIC
Bilirubin Urine: NEGATIVE
KETONES UR: NEGATIVE mg/dL
LEUKOCYTES UA: NEGATIVE
NITRITE: NEGATIVE
PROTEIN: 100 mg/dL — AB
Specific Gravity, Urine: 1.016 (ref 1.005–1.030)
pH: 6 (ref 5.0–8.0)

## 2015-04-18 LAB — BASIC METABOLIC PANEL
ANION GAP: 12 (ref 5–15)
BUN: 33 mg/dL — ABNORMAL HIGH (ref 6–20)
CALCIUM: 9.4 mg/dL (ref 8.9–10.3)
CO2: 26 mmol/L (ref 22–32)
Chloride: 102 mmol/L (ref 101–111)
Creatinine, Ser: 0.98 mg/dL (ref 0.44–1.00)
Glucose, Bld: 286 mg/dL — ABNORMAL HIGH (ref 65–99)
POTASSIUM: 4.7 mmol/L (ref 3.5–5.1)
Sodium: 140 mmol/L (ref 135–145)

## 2015-04-18 LAB — CBG MONITORING, ED
GLUCOSE-CAPILLARY: 455 mg/dL — AB (ref 65–99)
GLUCOSE-CAPILLARY: 276 mg/dL — AB (ref 65–99)

## 2015-04-18 MED ORDER — ONDANSETRON HCL 4 MG/2ML IJ SOLN
4.0000 mg | Freq: Once | INTRAMUSCULAR | Status: AC
  Administered 2015-04-18: 4 mg via INTRAVENOUS
  Filled 2015-04-18: qty 2

## 2015-04-18 MED ORDER — SODIUM CHLORIDE 0.9 % IV SOLN
INTRAVENOUS | Status: DC
  Administered 2015-04-18: 17:00:00 via INTRAVENOUS

## 2015-04-18 MED ORDER — MORPHINE SULFATE (PF) 2 MG/ML IV SOLN
2.0000 mg | INTRAVENOUS | Status: DC | PRN
  Administered 2015-04-18 (×2): 2 mg via INTRAVENOUS
  Filled 2015-04-18 (×2): qty 1

## 2015-04-18 MED ORDER — CEPHALEXIN 500 MG PO CAPS: 500.0000 mg | ORAL_CAPSULE | Freq: Four times a day (QID) | ORAL | Status: DC

## 2015-04-18 MED ORDER — INSULIN ASPART 100 UNIT/ML ~~LOC~~ SOLN
10.0000 [IU] | Freq: Once | SUBCUTANEOUS | Status: DC
  Filled 2015-04-18: qty 1

## 2015-04-18 MED ORDER — SODIUM CHLORIDE 0.9 % IV BOLUS (SEPSIS): 1000.0000 mL | Freq: Once | INTRAVENOUS | Status: DC

## 2015-04-18 NOTE — ED Provider Notes (Signed)
CSN: [id number]     Arrival date & time 04/18/15  65 History   First MD Initiated Contact with Patient 04/18/15 1601     Chief Complaint  Patient presents with  . Flank Pain     (Consider location/radiation/quality/duration/timing/severity/associated sxs/prior Treatment) HPI Comments: Patient here complaining of worsening left-sided flank pain times several weeks. Also has endorses hematuria. Had a ureteral stent placed for renal colic several weeks ago. Has not followed up. Denies any fever or chills. No vomiting or diarrhea. Pain is been colicky and not responsive to hydrocodone. Denies any vaginal bleeding or discharge. Has had elevated blood sugars at home and states compliance with her insulin. She is a type I diabetic.  Patient is a 30 y.o. female presenting with flank pain. The history is provided by the patient.  Flank Pain    Past Medical History  Diagnosis Date  . Diabetes mellitus   . Anxiety   . Skin cancer   . Fibromyalgia   . Anorexia nervosa    Past Surgical History  Procedure Laterality Date  . Other surgical history      cyst removed from face  . Back surgery    . Cystoscopy with stent placement Left 04/02/2015    Procedure: CYSTOSCOPY, RETROGRADE PYELOGRAM WITH LEFT STENT PLACEMENT;  Surgeon: Nickie Retort, MD;  Location: [facility];  Service: Urology;  Laterality: Left;   Family History  Problem Relation Age of Onset  . Hypertension Mother   . Hypertension Father    Social History  Substance Use Topics  . Smoking status: Current Every Day Smoker -- 1.00 packs/day  . Smokeless tobacco: None  . Alcohol Use: Yes     Comment: occasional   OB History    No data available     Review of Systems  Genitourinary: Positive for flank pain.  All other systems reviewed and are negative.     Allergies  Ivp dye and Adhesive  Home Medications   Prior to Admission medications   Medication Sig Start Date End Date Taking? Authorizing Provider  ALPRAZolam  Duanne Moron) 0.5 MG tablet Take 0.5-1 mg by mouth 2 (two) times daily as needed for sleep or anxiety.     Historical Provider, MD  cefUROXime (CEFTIN) 500 MG tablet Take 1 tablet (500 mg total) by mouth 2 (two) times daily with a meal. 04/04/15   Jessica U Vann, DO  CYMBALTA 30 MG capsule Take 30 mg by mouth daily.  10/26/13   Historical Provider, MD  fluticasone Asencion Islam ALLERGY RELIEF) 50 MCG/ACT nasal spray Place 1 spray into both nostrils daily. Patient taking differently: Place 1 spray into both nostrils daily as needed for allergies.  04/01/14   Hilton Sinclair, MD  gabapentin (NEURONTIN) 300 MG capsule Take 600-900 mg by mouth 3 (three) times daily. Take 600mg  capsule in the morning, 600 mg in the afternoon and 900 mg at bedtime 10/26/13   Historical Provider, MD  HYDROcodone-acetaminophen (NORCO/VICODIN) 5-325 MG tablet Take 1-2 tablets by mouth every 4 (four) hours as needed for moderate pain. 04/04/15   Geradine Girt, DO  hyoscyamine (LEVSIN, ANASPAZ) 0.125 MG tablet Take 0.125 mg by mouth every 4 (four) hours as needed for cramping.    Historical Provider, MD  insulin lispro (HUMALOG) 100 UNIT/ML injection Inject 4 Units into the skin 3 (three) times daily before meals.    Historical Provider, MD  LEVEMIR FLEXTOUCH 100 UNIT/ML Pen Inject 21 Units into the skin every morning.  08/27/13  Historical Provider, MD  levonorgestrel (MIRENA) 20 MCG/24HR IUD 1 each by Intrauterine route.    Historical Provider, MD  ondansetron (ZOFRAN ODT) 4 MG disintegrating tablet Take 1 tablet (4 mg total) by mouth every 8 (eight) hours as needed for nausea or vomiting. 10/29/14   Sharlett Iles, MD  promethazine (PHENERGAN) 12.5 MG tablet Take 12.5 mg by mouth daily as needed for nausea.     Historical Provider, MD  traMADol (ULTRAM) 50 MG tablet Take 100-150 mg by mouth 3 (three) times daily as needed for moderate pain. Take 100mg  in the morning, 100mg  in the afternoon and 150 mg at bedtime 10/28/13    Historical Provider, MD   BP 135/95 mmHg  Pulse 94  Temp(Src) 98.6 F (37 C) (Oral)  Resp 18  SpO2 100% Physical Exam  Constitutional: She is oriented to person, place, and time. She appears well-developed and well-nourished.  Non-toxic appearance. No distress.  HENT:  Head: Normocephalic and atraumatic.  Eyes: Conjunctivae, EOM and lids are normal. Pupils are equal, round, and reactive to light.  Neck: Normal range of motion. Neck supple. No tracheal deviation present. No thyroid mass present.  Cardiovascular: Normal rate, regular rhythm and normal heart sounds.  Exam reveals no gallop.   No murmur heard. Pulmonary/Chest: Effort normal and breath sounds normal. No stridor. No respiratory distress. She has no decreased breath sounds. She has no wheezes. She has no rhonchi. She has no rales.  Abdominal: Soft. Normal appearance and bowel sounds are normal. She exhibits no distension. There is no tenderness. There is CVA tenderness. There is no rigidity, no rebound and no guarding.  Musculoskeletal: Normal range of motion. She exhibits no edema or tenderness.  Neurological: She is alert and oriented to person, place, and time. She has normal strength. No cranial nerve deficit or sensory deficit. GCS eye subscore is 4. GCS verbal subscore is 5. GCS motor subscore is 6.  Skin: Skin is warm and dry. No abrasion and no rash noted.  Psychiatric: She has a normal mood and affect. Her speech is normal and behavior is normal.  Nursing note and vitals reviewed.   ED Course  Procedures (including critical care time) Labs Review Labs Reviewed  CBG MONITORING, ED - Abnormal; Notable for the following:    Glucose-Capillary 455 (*)    All other components within normal limits  URINE CULTURE  URINALYSIS, ROUTINE W REFLEX MICROSCOPIC (NOT AT [facility])  CBC WITH DIFFERENTIAL/PLATELET  BASIC METABOLIC PANEL  BLOOD GAS, VENOUS    Imaging Review No results found. I have personally reviewed and  evaluated these images and lab results as part of my medical decision-making.   EKG Interpretation None      MDM   Final diagnoses:  None    Patient given IV fluids and blood sugar has improved. Patient is refusing a repeat CBG. Renal ultrasound did not show any evidence of hydronephrosis. She was medicated here feels better. Patient's urinalysis noted and will treat with antibiotics. Patient encouraged to follow-up with her urologist    Lacretia Leigh, MD 04/18/15 [phone]

## 2015-04-18 NOTE — ED Notes (Signed)
Patient refused blood sugar check

## 2015-04-18 NOTE — ED Notes (Signed)
Patient here with complaints of left sided flank pain unrelieved with hydrocodone. Sent placed on 04/02/15. Also c/o of blood in urine.

## 2015-04-18 NOTE — ED Notes (Signed)
PT is aware of urine sample. Info me she went to restroom 76mins before coming back to room

## 2015-04-18 NOTE — Discharge Instructions (Signed)

## 2015-04-20 LAB — URINE CULTURE: Culture: 5000 — AB

## 2015-05-03 ENCOUNTER — Other Ambulatory Visit: Payer: Self-pay | Admitting: Urology

## 2015-05-05 ENCOUNTER — Encounter (HOSPITAL_BASED_OUTPATIENT_CLINIC_OR_DEPARTMENT_OTHER): Payer: Self-pay | Admitting: *Deleted

## 2015-05-06 ENCOUNTER — Encounter (HOSPITAL_BASED_OUTPATIENT_CLINIC_OR_DEPARTMENT_OTHER): Payer: Self-pay | Admitting: *Deleted

## 2015-05-06 NOTE — Progress Notes (Signed)
NPO AFTER MN.  ARRIVE AT 1000.  NEEDS ISTAT8 AND EKG.  WILL TAKE CYMBALTA AND GABAPENTIN AM DOS W/ SIPS OF WATER.

## 2015-05-09 ENCOUNTER — Encounter (HOSPITAL_BASED_OUTPATIENT_CLINIC_OR_DEPARTMENT_OTHER)
Admission: RE | Disposition: A | Discharge status: Home / Self Care | Payer: Self-pay | Source: Ambulatory Visit | Attending: Urology

## 2015-05-09 ENCOUNTER — Other Ambulatory Visit: Payer: Self-pay | Admitting: Urology

## 2015-05-09 ENCOUNTER — Ambulatory Visit (HOSPITAL_BASED_OUTPATIENT_CLINIC_OR_DEPARTMENT_OTHER)
Admission: RE | Admit: 2015-05-09 | Discharge: 2015-05-09 | Disposition: A | Discharge status: Home / Self Care | Payer: Medicaid Other | Source: Ambulatory Visit | Attending: Urology | Admitting: Urology

## 2015-05-09 ENCOUNTER — Encounter (HOSPITAL_BASED_OUTPATIENT_CLINIC_OR_DEPARTMENT_OTHER): Payer: Self-pay | Admitting: Anesthesiology

## 2015-05-09 ENCOUNTER — Encounter (HOSPITAL_BASED_OUTPATIENT_CLINIC_OR_DEPARTMENT_OTHER): Payer: Self-pay | Admitting: *Deleted

## 2015-05-09 DIAGNOSIS — Z79899 Other long term (current) drug therapy: Secondary | ICD-10-CM | POA: Insufficient documentation

## 2015-05-09 DIAGNOSIS — E119 Type 2 diabetes mellitus without complications: Secondary | ICD-10-CM | POA: Diagnosis not present

## 2015-05-09 DIAGNOSIS — F329 Major depressive disorder, single episode, unspecified: Secondary | ICD-10-CM | POA: Diagnosis not present

## 2015-05-09 DIAGNOSIS — Z794 Long term (current) use of insulin: Secondary | ICD-10-CM | POA: Diagnosis not present

## 2015-05-09 DIAGNOSIS — Z79891 Long term (current) use of opiate analgesic: Secondary | ICD-10-CM | POA: Diagnosis not present

## 2015-05-09 DIAGNOSIS — N135 Crossing vessel and stricture of ureter without hydronephrosis: Secondary | ICD-10-CM | POA: Insufficient documentation

## 2015-05-09 DIAGNOSIS — M1990 Unspecified osteoarthritis, unspecified site: Secondary | ICD-10-CM | POA: Diagnosis not present

## 2015-05-09 DIAGNOSIS — Z539 Procedure and treatment not carried out, unspecified reason: Secondary | ICD-10-CM | POA: Insufficient documentation

## 2015-05-09 DIAGNOSIS — F419 Anxiety disorder, unspecified: Secondary | ICD-10-CM | POA: Insufficient documentation

## 2015-05-09 DIAGNOSIS — F17210 Nicotine dependence, cigarettes, uncomplicated: Secondary | ICD-10-CM | POA: Insufficient documentation

## 2015-05-09 HISTORY — DX: Cyst of kidney, acquired: N28.1

## 2015-05-09 HISTORY — DX: Type 1 diabetes mellitus with hyperglycemia: E10.65

## 2015-05-09 HISTORY — DX: Personal history of other diseases of urinary system: Z87.448

## 2015-05-09 HISTORY — DX: Presence of spectacles and contact lenses: Z97.3

## 2015-05-09 HISTORY — DX: Irritable bowel syndrome, unspecified: K58.9

## 2015-05-09 HISTORY — DX: Unspecified osteoarthritis, unspecified site: M19.90

## 2015-05-09 HISTORY — DX: Other specified abnormal immunological findings in serum: R76.8

## 2015-05-09 HISTORY — DX: Presence of urogenital implants: Z96.0

## 2015-05-09 HISTORY — DX: Reserved for concepts with insufficient information to code with codable children: IMO0002

## 2015-05-09 LAB — POCT I-STAT, CHEM 8
BUN: 27 mg/dL — ABNORMAL HIGH (ref 6–20)
CALCIUM ION: 1.24 mmol/L — AB (ref 1.12–1.23)
Chloride: 93 mmol/L — ABNORMAL LOW (ref 101–111)
Creatinine, Ser: 1 mg/dL (ref 0.44–1.00)
Glucose, Bld: 576 mg/dL (ref 65–99)
HEMATOCRIT: 35 % — AB (ref 36.0–46.0)
Hemoglobin: 11.9 g/dL — ABNORMAL LOW (ref 12.0–15.0)
Potassium: 4.4 mmol/L (ref 3.5–5.1)
SODIUM: 132 mmol/L — AB (ref 135–145)
TCO2: 26 mmol/L (ref 0–100)

## 2015-05-09 LAB — POCT PREGNANCY, URINE: PREG TEST UR: NEGATIVE

## 2015-05-09 LAB — GLUCOSE, CAPILLARY: GLUCOSE-CAPILLARY: 574 mg/dL — AB (ref 65–99)

## 2015-05-09 SURGERY — REMOVAL, STENT, URETER, CYSTOSCOPIC
Anesthesia: General | Laterality: Left

## 2015-05-09 MED ORDER — INSULIN ASPART 100 UNIT/ML ~~LOC~~ SOLN
SUBCUTANEOUS | Status: AC
  Filled 2015-05-09: qty 1

## 2015-05-09 MED ORDER — INSULIN ASPART 100 UNIT/ML ~~LOC~~ SOLN
0.0000 [IU] | SUBCUTANEOUS | Status: DC
  Filled 2015-05-09: qty 0.24

## 2015-05-09 MED ORDER — CEFAZOLIN SODIUM-DEXTROSE 2-4 GM/100ML-% IV SOLN
INTRAVENOUS | Status: AC
  Filled 2015-05-09: qty 100

## 2015-05-09 MED ORDER — LACTATED RINGERS IV SOLN
INTRAVENOUS | Status: DC
  Administered 2015-05-09: 11:00:00 via INTRAVENOUS
  Filled 2015-05-09: qty 1000

## 2015-05-09 MED ORDER — DEXTROSE 5 % IV SOLN
1.5000 mg/kg | INTRAVENOUS | Status: DC
  Filled 2015-05-09: qty 1.75

## 2015-05-09 MED ORDER — CEFAZOLIN SODIUM-DEXTROSE 2-4 GM/100ML-% IV SOLN
2.0000 g | INTRAVENOUS | Status: DC
  Filled 2015-05-09: qty 100

## 2015-05-09 MED ORDER — SODIUM CHLORIDE 0.9 % IV SOLN
2.0000 g | Freq: Once | INTRAVENOUS | Status: DC
  Filled 2015-05-09: qty 2000

## 2015-05-09 NOTE — H&P (Signed)
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 She did have a chronic left UPJ obstruction that was originally not felt to be the source of her polynephritis, however she continued to clinically worsened despite antibiotic therapy so a left ureteral stent was placed. She has a recent negative urine culture. She presents today for left ureteral stent removal. Unable to remove stents due to poor visualization. Recent KUB shows stent in the appropriate position.   Past Medical History Problems  1. History of Anxiety (F41.9) 2. History of arthritis (Z87.39) 3. History of depression (Z86.59) 4. History of diabetes mellitus (Z86.39) 5. History of esophageal reflux (Z87.19)  Surgical History Problems  1. History of Cervical Loop Electrosurgical Excision (LEEP) 2. History of Cystoscopy With Insertion Of Ureteral Stent Left  Current Meds 1. Cymbalta 30 MG Oral Capsule Delayed Release Particles;  Therapy: (Recorded:18Apr2017) to Recorded 2. Gabapentin 300 MG Oral Capsule;  Therapy: (Recorded:18Apr2017) to Recorded 3. Hydrocodone-Acetaminophen TABS;  Therapy: (Recorded:18Apr2017) to Recorded 4. Ketorolac Tromethamine 10 MG Oral Tablet; Take 1 tablet every 6 hours;  Therapy: 18Apr2017 to (Last Rx:18Apr2017)  Requested for: 18Apr2017 Ordered 5. Levemir 100 UNIT/ML Subcutaneous Solution;  Therapy: (Recorded:18Apr2017) to Recorded 6. Promethazine HCl TABS;  Therapy: (Recorded:18Apr2017) to Recorded 7. TraMADol HCl - 50 MG Oral Tablet; TAKE 1 TABLET Every 6 hours PRN;  Therapy: 18Apr2017 to (Last Rx:18Apr2017) Ordered 8. Xanax TABS;  Therapy: (Recorded:18Apr2017) to Recorded  Allergies Medication  1. Adhesive Tape TAPE 2. Contrast Media Ready-Box MISC Non-Medication  3. Adhesive Tape 4. Contrast Dye  Family History Problems   1. Family history of hypertension (Z82.49) : Father  Social History Problems  1. Denied: History of Alcohol use 2. Denied: History of Caffeine use 3. Current every day smoker (F17.200)   smokes 1 ppd since 2004 4. Number of children   1 daughter 5. Single 6. Unemployed (Z56.0)  Vitals Vital Signs [Data Includes: Last 1 Day]  Recorded: [id number] 03:20PM  Blood Pressure: 159 / 92 Temperature: 99 F Heart Rate: 94  Physical Exam Constitutional: Well nourished . No acute distress.  ENT:. The ears and nose are normal in appearance.  Neck: The appearance of the neck is normal.  Pulmonary: No respiratory distress.  Cardiovascular:. No peripheral edema.  Abdomen: The abdomen is soft and nontender.  Skin: Normal skin turgor and no visible rash.  Neuro/Psych:. Mood and affect are appropriate. No focal sensory deficits.    Results/Data Urine [Data Includes: Last 1 Day]   [id number]  COLOR YELLOW   APPEARANCE CLOUDY   SPECIFIC GRAVITY 1.010   pH 6.0   GLUCOSE 3+   BILIRUBIN NEGATIVE   KETONE NEGATIVE   BLOOD 3+   PROTEIN 2+   NITRITE POSITIVE   LEUKOCYTE ESTERASE TRACE   SQUAMOUS EPITHELIAL/HPF 6-10 HPF  WBC >60 WBC/HPF  RBC 3-10 RBC/HPF  BACTERIA MODERATE HPF  CRYSTALS NONE SEEN HPF  CASTS NONE SEEN LPF  Yeast FEW HPF   Procedure   Chaperone Present: Lauren.  Antibiotic prophylaxis: Ciprofloxacin  Procedure: Cystoscopy / Stent Removal  After consent was obtained, the patient was placed in the dorsal lithotomy position and prepped in the usual fashion. Flexible cystourethroscopy was performed and the indwelling left ureteral stent was identified and noted to be encrusted. The flexible grasping forceps were then used to attempt to remove the stent. The stent was  unable to be removed safely and therefore was left indwelling. The patient tolerated the procedure well and without complications.  poor visualualization    Assessment Assessed  1. 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 UA With REFLEX; [Do Not Release]; Status:Resulted - Requires Verification;   [id number] 03:03PM  1. Left UPJ  2. Retained left ureteral stent  I was unable to remove the stent in the office today due to poor visualization. We'll send her urine for repeat culture. We will schedule her for cystoscopy, left renal stent removal in the operating room in the near future.    Signatures Electronically signed by : Baruch Gouty, M.D.; May 02 2015  4:36PM EST

## 2015-05-09 NOTE — Anesthesia Preprocedure Evaluation (Deleted)
Anesthesia Evaluation  Patient identified by MRN, date of birth, ID band Patient awake    Reviewed: Allergy & Precautions, NPO status , Patient's Chart, lab work & pertinent test results  History of Anesthesia Complications Negative for: history of anesthetic complications  Airway Mallampati: II  TM Distance: >3 FB Neck ROM: Full    Dental no notable dental hx. (+) Dental Advisory Given   Pulmonary Current Smoker,    Pulmonary exam normal breath sounds clear to auscultation       Cardiovascular negative cardio ROS Normal cardiovascular exam Rhythm:Regular Rate:Normal     Neuro/Psych PSYCHIATRIC DISORDERS Anxiety negative neurological ROS     GI/Hepatic negative GI ROS, Neg liver ROS,   Endo/Other  negative endocrine ROSdiabetes, Poorly Controlled, Insulin Dependent  Renal/GU   negative genitourinary   Musculoskeletal  (+) Arthritis , Fibromyalgia -  Abdominal   Peds negative pediatric ROS (+)  Hematology negative hematology ROS (+)   Anesthesia Other Findings   Reproductive/Obstetrics negative OB ROS                             Anesthesia Physical Anesthesia Plan  ASA: III  Anesthesia Plan: General   Post-op Pain Management:    Induction: Intravenous  Airway Management Planned: Oral ETT  Additional Equipment:   Intra-op Plan:   Post-operative Plan: Extubation in OR  Informed Consent: I have reviewed the patients History and Physical, chart, labs and discussed the procedure including the risks, benefits and alternatives for the proposed anesthesia with the patient or authorized representative who has indicated his/her understanding and acceptance.   Dental advisory given  Plan Discussed with: CRNA  Anesthesia Plan Comments: (Patient reports that she did not take her insulin at all yesterday. Her sugars are very poorly controlled. Recently in the ED with BS of >700. Today  on both an istat and finger stick her BS was >500. Dr. Pilar Jarvis does not want to proceed with stent removal with BS this high and I agree with anesthetic concerns as well with her extremely poorly controlled BS and medication noncompliance. We requested the diabetes coordinator come see the patient and plan on treating BS but patient refused and just desires to leave. She is very tearful. We spoke with her for a long time emphasizing the importance in treatment and BS control. She says she will not go to the Emergency Room. She reports that she will call her endocrinologists today and we urged her strongly to go see this physician today if she would not allow [place] to manage her or go to the ED. She reports that she has an eating disorder and this has made control over her BS very challenging. We offered her every resource available but she refused and reports she will go see her own physicians. Dr. Pilar Jarvis requests that she have a plan for managing her BS prior to surgery from her endocrinologists and she agreed to this. Other than being very tearful, she denies any additional symptoms. -MJUDD)        Anesthesia Quick Evaluation

## 2015-05-23 NOTE — Patient Instructions (Signed)
AIDEEN HEIDTMAN  05/23/2015   Your procedure is scheduled on: 05/31/2015    Report to [facility]  Entrance take [facility]  elevators to 3rd floor to  [facility] [phone] AM.  Call this number if you have problems the morning of surgery [phone]   Remember: ONLY 1 PERSON MAY GO WITH YOU TO SHORT STAY TO GET  READY MORNING OF [place].  Do not eat food or drink liquids :After Midnight.             Eat a good healthy snack prior to bedtime.,      Take these medicines the morning of surgery with A SIP OF WATER:  Xanax if needed, Cymbalta, Flonase if needed   DO NOT TAKE ANY DIABETIC MEDICATIONS DAY OF YOUR SURGERY                               You may not have any metal on your body including hair pins and              piercings  Do not wear jewelry, make-up, lotions, powders or perfumes, deodorant             Do not wear nail polish.  Do not shave  48 hours prior to surgery.               Do not bring valuables to the hospital. [place].  Contacts, dentures or bridgework may not be worn into surgery.       Patients discharged the day of surgery will not be allowed to drive home.  Name and phone number of your driver:  Special Instructions: coughing and deep breathing exercises,leg exercises               Please read over the following fact sheets you were given: _____________________________________________________________________             [facility] - Preparing for Surgery Before surgery, you can play an important role.  Because skin is not sterile, your skin needs to be as free of germs as possible.  You can reduce the number of germs on your skin by washing with CHG (chlorahexidine gluconate) soap before surgery.  CHG is an antiseptic cleaner which kills germs and bonds with the skin to continue killing germs even after washing. Please DO NOT use if you have an allergy to CHG or  antibacterial soaps.  If your skin becomes reddened/irritated stop using the CHG and inform your nurse when you arrive at Short Stay. Do not shave (including legs and underarms) for at least 48 hours prior to the first CHG shower.  You may shave your face/neck. Please follow these instructions carefully:  1.  Shower with CHG Soap the night before surgery and the  morning of Surgery.  2.  If you choose to wash your hair, wash your hair first as usual with your  normal  shampoo.  3.  After you shampoo, rinse your hair and body thoroughly to remove the  shampoo.                           4.  Use CHG as you would any  other liquid soap.  You can apply chg directly  to the skin and wash                       Gently with a scrungie or clean washcloth.  5.  Apply the CHG Soap to your body ONLY FROM THE NECK DOWN.   Do not use on face/ open                           Wound or open sores. Avoid contact with eyes, ears mouth and genitals (private parts).                       Wash face,  Genitals (private parts) with your normal soap.             6.  Wash thoroughly, paying special attention to the area where your surgery  will be performed.  7.  Thoroughly rinse your body with warm water from the neck down.  8.  DO NOT shower/wash with your normal soap after using and rinsing off  the CHG Soap.                9.  Pat yourself dry with a clean towel.            10.  Wear clean pajamas.            11.  Place clean sheets on your bed the night of your first shower and do not  sleep with pets. Day of Surgery : Do not apply any lotions/deodorants the morning of surgery.  Please wear clean clothes to the hospital/surgery center.  FAILURE TO FOLLOW THESE INSTRUCTIONS MAY RESULT IN THE CANCELLATION OF YOUR SURGERY PATIENT SIGNATURE_________________________________  NURSE SIGNATURE__________________________________  ________________________________________________________________________

## 2015-05-25 ENCOUNTER — Encounter (HOSPITAL_COMMUNITY)
Admission: RE | Admit: 2015-05-25 | Discharge: 2015-05-25 | Disposition: A | Discharge status: Home / Self Care | Payer: Medicaid Other | Source: Ambulatory Visit | Attending: Urology | Admitting: Urology

## 2015-05-25 ENCOUNTER — Encounter (HOSPITAL_COMMUNITY): Payer: Self-pay

## 2015-05-25 DIAGNOSIS — Z01812 Encounter for preprocedural laboratory examination: Secondary | ICD-10-CM | POA: Insufficient documentation

## 2015-05-25 DIAGNOSIS — N1330 Unspecified hydronephrosis: Secondary | ICD-10-CM | POA: Diagnosis not present

## 2015-05-25 HISTORY — DX: Major depressive disorder, single episode, unspecified: F32.9

## 2015-05-25 HISTORY — DX: Gastro-esophageal reflux disease without esophagitis: K21.9

## 2015-05-25 HISTORY — DX: Depression, unspecified: F32.A

## 2015-05-25 LAB — BASIC METABOLIC PANEL
ANION GAP: 8 (ref 5–15)
BUN: 18 mg/dL (ref 6–20)
CALCIUM: 9.1 mg/dL (ref 8.9–10.3)
CO2: 28 mmol/L (ref 22–32)
Chloride: 97 mmol/L — ABNORMAL LOW (ref 101–111)
Creatinine, Ser: 1.2 mg/dL — ABNORMAL HIGH (ref 0.44–1.00)
GFR calc Af Amer: >60 mL/min (ref >60)
GLUCOSE: 487 mg/dL — AB (ref 65–99)
Potassium: 5.1 mmol/L (ref 3.5–5.1)
Sodium: 133 mmol/L — ABNORMAL LOW (ref 135–145)

## 2015-05-25 LAB — HCG, SERUM, QUALITATIVE: Preg, Serum: NEGATIVE

## 2015-05-25 LAB — CBC
HEMATOCRIT: 32.6 % — AB (ref 36.0–46.0)
Hemoglobin: 10.7 g/dL — ABNORMAL LOW (ref 12.0–15.0)
MCH: 26.3 pg (ref 26.0–34.0)
MCHC: 32.8 g/dL (ref 30.0–36.0)
MCV: 80.1 fL (ref 78.0–100.0)
PLATELETS: 404 10*3/uL — AB (ref 150–400)
RBC: 4.07 MIL/uL (ref 3.87–5.11)
RDW: 12.8 % (ref 11.5–15.5)
WBC: 13.3 10*3/uL — AB (ref 4.0–10.5)

## 2015-05-25 NOTE — Progress Notes (Signed)
BMp done 05/25/2015 faxed via EPIC to Dr Pilar Jarvis.

## 2015-05-25 NOTE — Progress Notes (Signed)
EKG-05/09/15- EPIC

## 2015-05-25 NOTE — Progress Notes (Signed)
Requested by fax and received confirmation requesting latest HGA1C from [facility].

## 2015-05-25 NOTE — Progress Notes (Signed)
CBC done 05/25/15 faxed via EPIC to [place].

## 2015-05-26 NOTE — Progress Notes (Signed)
rerequested labs from [facility]- received and faxed to dr Pilar Jarvis with confirmation received. Also left VM with Selita at [facility] at 1430 of abnormal that needed to be reviewed by dr Pilar Jarvis

## 2015-05-26 NOTE — Progress Notes (Signed)
Received fax from medical records at [facility] stating they were unable to read the hgba1c report and that dr Tilda Burrow is in [place] today.  I called [place] office at [phone] and was told he was in the office Monday but not today.  I refaxed labs with confirmation received with note to medical records to please task this to him.  I also called Selita at [facility] and left her message to please verify he receives this before her surgery day

## 2015-05-27 NOTE — Progress Notes (Signed)
Placed LOV [facility] 05/12/15 on chart, Also note from [facility] at [facility] that stated the surgery would proceed as scheduled

## 2015-05-30 MED ORDER — GENTAMICIN SULFATE 40 MG/ML IJ SOLN
5.0000 mg/kg | INTRAVENOUS | Status: AC
  Administered 2015-05-31: 236 mg via INTRAVENOUS
  Filled 2015-05-30 (×2): qty 6

## 2015-05-30 NOTE — Anesthesia Preprocedure Evaluation (Addendum)
Anesthesia Evaluation  Patient identified by MRN, date of birth, ID band Patient awake    Reviewed: Allergy & Precautions, NPO status , Patient's Chart, lab work & pertinent test results  History of Anesthesia Complications Negative for: history of anesthetic complications  Airway Mallampati: I  TM Distance: >3 FB Neck ROM: Full    Dental no notable dental hx. (+) Dental Advisory Given   Pulmonary Current Smoker,    Pulmonary exam normal breath sounds clear to auscultation       Cardiovascular negative cardio ROS Normal cardiovascular exam Rhythm:Regular Rate:Normal     Neuro/Psych PSYCHIATRIC DISORDERS Anxiety negative neurological ROS     GI/Hepatic negative GI ROS, Neg liver ROS,   Endo/Other  diabetes, Poorly Controlled, Insulin Dependent  Renal/GU   negative genitourinary   Musculoskeletal  (+) Arthritis , Fibromyalgia -  Abdominal   Peds negative pediatric ROS (+)  Hematology negative hematology ROS (+)   Anesthesia Other Findings Glucose today is 108, she gets symptomatic when they are around 18  Very poorly controlled diabetic due to her inconsistent insulin taking practices, supposed to take Levemir 22units daily and decrease that to 16 units the day before a surgery  Reproductive/Obstetrics                            Anesthesia Physical  Anesthesia Plan  ASA: III  Anesthesia Plan: General   Post-op Pain Management:    Induction: Intravenous  Airway Management Planned: LMA  Additional Equipment:   Intra-op Plan:   Post-operative Plan: Extubation in OR  Informed Consent: I have reviewed the patients History and Physical, chart, labs and discussed the procedure including the risks, benefits and alternatives for the proposed anesthesia with the patient or authorized representative who has indicated his/her understanding and acceptance.   Dental advisory given  Plan  Discussed with: CRNA  Anesthesia Plan Comments: (Endocrinology note in chart  Pregnancy test negative)       Anesthesia Quick Evaluation

## 2015-05-31 ENCOUNTER — Ambulatory Visit (HOSPITAL_COMMUNITY): Payer: Medicaid Other | Admitting: Anesthesiology

## 2015-05-31 ENCOUNTER — Encounter (HOSPITAL_COMMUNITY): Payer: Self-pay | Admitting: *Deleted

## 2015-05-31 ENCOUNTER — Ambulatory Visit (HOSPITAL_COMMUNITY)
Admission: RE | Admit: 2015-05-31 | Discharge: 2015-05-31 | Disposition: A | Discharge status: Home / Self Care | Payer: Medicaid Other | Source: Ambulatory Visit | Attending: Urology | Admitting: Urology

## 2015-05-31 ENCOUNTER — Encounter (HOSPITAL_COMMUNITY)
Admission: RE | Disposition: A | Discharge status: Home / Self Care | Payer: Self-pay | Source: Ambulatory Visit | Attending: Urology

## 2015-05-31 DIAGNOSIS — F329 Major depressive disorder, single episode, unspecified: Secondary | ICD-10-CM | POA: Insufficient documentation

## 2015-05-31 DIAGNOSIS — E1165 Type 2 diabetes mellitus with hyperglycemia: Secondary | ICD-10-CM | POA: Diagnosis not present

## 2015-05-31 DIAGNOSIS — N130 Hydronephrosis with ureteropelvic junction obstruction: Secondary | ICD-10-CM | POA: Diagnosis not present

## 2015-05-31 DIAGNOSIS — Z794 Long term (current) use of insulin: Secondary | ICD-10-CM | POA: Insufficient documentation

## 2015-05-31 DIAGNOSIS — Z466 Encounter for fitting and adjustment of urinary device: Secondary | ICD-10-CM | POA: Diagnosis present

## 2015-05-31 DIAGNOSIS — Z79899 Other long term (current) drug therapy: Secondary | ICD-10-CM | POA: Diagnosis not present

## 2015-05-31 DIAGNOSIS — F419 Anxiety disorder, unspecified: Secondary | ICD-10-CM | POA: Insufficient documentation

## 2015-05-31 DIAGNOSIS — F17210 Nicotine dependence, cigarettes, uncomplicated: Secondary | ICD-10-CM | POA: Diagnosis not present

## 2015-05-31 DIAGNOSIS — M797 Fibromyalgia: Secondary | ICD-10-CM | POA: Insufficient documentation

## 2015-05-31 HISTORY — PX: CYSTOSCOPY W/ URETERAL STENT REMOVAL: SHX1430

## 2015-05-31 LAB — GLUCOSE, CAPILLARY
GLUCOSE-CAPILLARY: 72 mg/dL (ref 65–99)
Glucose-Capillary: 108 mg/dL — ABNORMAL HIGH (ref 65–99)

## 2015-05-31 SURGERY — REMOVAL, STENT, URETER, CYSTOSCOPIC
Anesthesia: General | Laterality: Left

## 2015-05-31 MED ORDER — LIDOCAINE HCL (CARDIAC) 20 MG/ML IV SOLN
INTRAVENOUS | Status: DC | PRN
  Administered 2015-05-31: 50 mg via INTRAVENOUS

## 2015-05-31 MED ORDER — CEFAZOLIN SODIUM-DEXTROSE 2-4 GM/100ML-% IV SOLN
INTRAVENOUS | Status: AC
  Filled 2015-05-31: qty 100

## 2015-05-31 MED ORDER — MIDAZOLAM HCL 2 MG/2ML IJ SOLN
INTRAMUSCULAR | Status: AC
  Filled 2015-05-31: qty 2

## 2015-05-31 MED ORDER — DIATRIZOATE MEGLUMINE 30 % UR SOLN
URETHRAL | Status: AC
  Filled 2015-05-31: qty 100

## 2015-05-31 MED ORDER — PROPOFOL 10 MG/ML IV BOLUS
INTRAVENOUS | Status: DC | PRN
  Administered 2015-05-31: 120 mg via INTRAVENOUS

## 2015-05-31 MED ORDER — ONDANSETRON HCL 4 MG/2ML IJ SOLN
INTRAMUSCULAR | Status: DC | PRN
  Administered 2015-05-31 (×2): 4 mg via INTRAVENOUS

## 2015-05-31 MED ORDER — EPHEDRINE SULFATE 50 MG/ML IJ SOLN
INTRAMUSCULAR | Status: AC
  Filled 2015-05-31: qty 1

## 2015-05-31 MED ORDER — SODIUM CHLORIDE 0.9 % IR SOLN
Status: DC | PRN
  Administered 2015-05-31: 1

## 2015-05-31 MED ORDER — DIATRIZOATE MEGLUMINE 30 % UR SOLN
URETHRAL | Status: DC | PRN
  Administered 2015-05-31: 10 mL via URETHRAL

## 2015-05-31 MED ORDER — LIDOCAINE HCL (CARDIAC) 20 MG/ML IV SOLN
INTRAVENOUS | Status: AC
  Filled 2015-05-31: qty 5

## 2015-05-31 MED ORDER — CEPHALEXIN 500 MG PO CAPS: 500.0000 mg | ORAL_CAPSULE | Freq: Three times a day (TID) | ORAL | Status: DC

## 2015-05-31 MED ORDER — LACTATED RINGERS IV SOLN
INTRAVENOUS | Status: DC
  Administered 2015-05-31: 08:00:00 via INTRAVENOUS

## 2015-05-31 MED ORDER — FENTANYL CITRATE (PF) 100 MCG/2ML IJ SOLN
INTRAMUSCULAR | Status: DC | PRN
  Administered 2015-05-31: 25 ug via INTRAVENOUS

## 2015-05-31 MED ORDER — HYDROCODONE-ACETAMINOPHEN 5-325 MG PO TABS: 1.0000 | ORAL_TABLET | Freq: Four times a day (QID) | ORAL | Status: DC | PRN

## 2015-05-31 MED ORDER — CEFAZOLIN SODIUM-DEXTROSE 2-4 GM/100ML-% IV SOLN
2.0000 g | INTRAVENOUS | Status: DC
  Filled 2015-05-31: qty 100

## 2015-05-31 MED ORDER — SODIUM CHLORIDE 0.9 % IV SOLN
2.0000 g | INTRAVENOUS | Status: AC
  Administered 2015-05-31: 2 g via INTRAVENOUS
  Filled 2015-05-31: qty 2000

## 2015-05-31 MED ORDER — ONDANSETRON HCL 4 MG/2ML IJ SOLN: 4.0000 mg | Freq: Once | INTRAMUSCULAR | Status: DC | PRN

## 2015-05-31 MED ORDER — MIDAZOLAM HCL 5 MG/5ML IJ SOLN
INTRAMUSCULAR | Status: DC | PRN
  Administered 2015-05-31: 2 mg via INTRAVENOUS

## 2015-05-31 MED ORDER — ONDANSETRON HCL 4 MG/2ML IJ SOLN
INTRAMUSCULAR | Status: AC
  Filled 2015-05-31: qty 2

## 2015-05-31 MED ORDER — SODIUM CHLORIDE 0.9 % IJ SOLN
INTRAMUSCULAR | Status: AC
  Filled 2015-05-31: qty 10

## 2015-05-31 MED ORDER — FENTANYL CITRATE (PF) 100 MCG/2ML IJ SOLN: 25.0000 ug | INTRAMUSCULAR | Status: DC | PRN

## 2015-05-31 MED ORDER — FENTANYL CITRATE (PF) 100 MCG/2ML IJ SOLN
INTRAMUSCULAR | Status: AC
  Filled 2015-05-31: qty 2

## 2015-05-31 MED ORDER — PROPOFOL 10 MG/ML IV BOLUS
INTRAVENOUS | Status: AC
  Filled 2015-05-31: qty 20

## 2015-05-31 SURGICAL SUPPLY — 12 items
BAG URO CATCHER STRL LF (MISCELLANEOUS) ×3 IMPLANT
CATH INTERMIT  6FR 70CM (CATHETERS) IMPLANT
CATH URET 5FR 28IN OPEN ENDED (CATHETERS) ×3 IMPLANT
CLOTH BEACON ORANGE TIMEOUT ST (SAFETY) ×3 IMPLANT
GLOVE BIO SURGEON STRL SZ7.5 (GLOVE) ×3 IMPLANT
GOWN STRL REUS W/TWL LRG LVL3 (GOWN DISPOSABLE) ×6 IMPLANT
GUIDEWIRE ANG ZIPWIRE 038X150 (WIRE) IMPLANT
GUIDEWIRE STR DUAL SENSOR (WIRE) IMPLANT
MANIFOLD NEPTUNE II (INSTRUMENTS) ×3 IMPLANT
PACK CYSTO (CUSTOM PROCEDURE TRAY) ×3 IMPLANT
TUBING CONNECTING 10 (TUBING) ×2 IMPLANT
TUBING CONNECTING 10' (TUBING) ×1

## 2015-05-31 NOTE — Op Note (Addendum)
Date of procedure: 05/31/2015  Preoperative diagnosis:  1. Left hydronephrosis   Postoperative diagnosis:  1. Left hydronephrosis   Procedure: 1. Cystoscopy 2. Left ureteral stent removal 3. Left retrograde pyelogram with interpretation  Surgeon: Brian Budzyn, MD  Anesthesia: General  Complications: None  Intraoperative findings: The patient's left ureteral stent was successfully removed. The patient's left collecting system to drain contrast appropriately though it took an extended amount of time.  EBL: None  Specimens: None  Drains: None  Disposition: Stable to the postanesthesia care unit  Indication for procedure: The patient is a 29 y.o. female with uncontrolled diabetes (hyperglycemia) and left UPJ obstruction causing hydronephrosis. She initially presented to the hospital with diabetic ketoacidosis, urinary tract infection, pyelonephritis. She continued to spike fevers so a left ureteral stent was placed at that time as it was unclear if the patient's left UPJ was contributing to her clinical situation. She presents today for stent removal..  After reviewing the management options for treatment, the patient elected to proceed with the above surgical procedure(s). We have discussed the potential benefits and risks of the procedure, side effects of the proposed treatment, the likelihood of the patient achieving the goals of the procedure, and any potential problems that might occur during the procedure or recuperation. Informed consent has been obtained.  Description of procedure: The patient was met in the preoperative area. All risks, benefits, and indications of the procedure were described in great detail. The patient consented to the procedure. Preoperative antibiotics were given. The patient was taken to the operative theater. General anesthesia was induced per the anesthesia service. The patient was then placed in the dorsal lithotomy position and prepped and draped in  the usual sterile fashion. A preoperative timeout was called.   A 21 French 30 cystoscope was inserted in the patient's bladder per urethra atraumatically. The left ureteral stent was visualized. It was grabbed flexible graspers and removed intact per urethra. The left retrograde powder was then obtained. There was delayed excretion of contrast from the left renal pelvis, however the left renal pelvis did ultimately drained. This point, the patient's bladder was emptied and she was woken from anesthesia and transferred in stable condition post care unit.  Plan: The patient will follow-up in one month with a renal ultrasound. If she has persistent hydronephrosis on the left at that time, we will obtain a Lasix renogram to assess drainage of her left collecting system.  Brian Budzyn, M.D.   

## 2015-05-31 NOTE — Interval H&P Note (Signed)
History and Physical Interval Note:  05/31/2015 7:02 AM  Tara Bullock  has presented today for surgery, with the diagnosis of LEFT URETEROPELVIC JUNCTION RETAINED STENT  The various methods of treatment have been discussed with the patient and family. After consideration of risks, benefits and other options for treatment, the patient has consented to  Procedure(s): CYSTOSCOPY WITH STENT REMOVAL (Left) as a surgical intervention .  The patient's history has been reviewed, patient examined, no change in status, stable for surgery.  I have reviewed the patient's chart and labs.  Questions were answered to the patient's satisfaction.     RRR Unlabored resp   Nickie Retort

## 2015-05-31 NOTE — H&P (View-Only) (Signed)
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 She did have a chronic left UPJ obstruction that was originally not felt to be the source of her polynephritis, however she continued to clinically worsened despite antibiotic therapy so a left ureteral stent was placed. She has a recent negative urine culture. She presents today for left ureteral stent removal. Unable to remove stents due to poor visualization. Recent KUB shows stent in the appropriate position.   Past Medical History Problems  1. History of Anxiety (F41.9) 2. History of arthritis (Z87.39) 3. History of depression (Z86.59) 4. History of diabetes mellitus (Z86.39) 5. History of esophageal reflux (Z87.19)  Surgical History Problems  1. History of Cervical Loop Electrosurgical Excision (LEEP) 2. History of Cystoscopy With Insertion Of Ureteral Stent Left  Current Meds 1. Cymbalta 30 MG Oral Capsule Delayed Release Particles;  Therapy: (Recorded:18Apr2017) to Recorded 2. Gabapentin 300 MG Oral Capsule;  Therapy: (Recorded:18Apr2017) to Recorded 3. Hydrocodone-Acetaminophen TABS;  Therapy: (Recorded:18Apr2017) to Recorded 4. Ketorolac Tromethamine 10 MG Oral Tablet; Take 1 tablet every 6 hours;  Therapy: 18Apr2017 to (Last Rx:18Apr2017)  Requested for: 18Apr2017 Ordered 5. Levemir 100 UNIT/ML Subcutaneous Solution;  Therapy: (Recorded:18Apr2017) to Recorded 6. Promethazine HCl TABS;  Therapy: (Recorded:18Apr2017) to Recorded 7. TraMADol HCl - 50 MG Oral Tablet; TAKE 1 TABLET Every 6 hours PRN;  Therapy: 18Apr2017 to (Last Rx:18Apr2017) Ordered 8. Xanax TABS;  Therapy: (Recorded:18Apr2017) to Recorded  Allergies Medication  1. Adhesive Tape TAPE 2. Contrast Media Ready-Box MISC Non-Medication  3. Adhesive Tape 4. Contrast Dye  Family History Problems   1. Family history of hypertension (Z82.49) : Father  Social History Problems  1. Denied: History of Alcohol use 2. Denied: History of Caffeine use 3. Current every day smoker (F17.200)   smokes 1 ppd since 2004 4. Number of children   1 daughter 5. Single 6. Unemployed (Z56.0)  Vitals Vital Signs [Data Includes: Last 1 Day]  Recorded: [id number] 03:20PM  Blood Pressure: 159 / 92 Temperature: 99 F Heart Rate: 94  Physical Exam Constitutional: Well nourished . No acute distress.  ENT:. The ears and nose are normal in appearance.  Neck: The appearance of the neck is normal.  Pulmonary: No respiratory distress.  Cardiovascular:. No peripheral edema.  Abdomen: The abdomen is soft and nontender.  Skin: Normal skin turgor and no visible rash.  Neuro/Psych:. Mood and affect are appropriate. No focal sensory deficits.    Results/Data Urine [Data Includes: Last 1 Day]   [id number]  COLOR YELLOW   APPEARANCE CLOUDY   SPECIFIC GRAVITY 1.010   pH 6.0   GLUCOSE 3+   BILIRUBIN NEGATIVE   KETONE NEGATIVE   BLOOD 3+   PROTEIN 2+   NITRITE POSITIVE   LEUKOCYTE ESTERASE TRACE   SQUAMOUS EPITHELIAL/HPF 6-10 HPF  WBC >60 WBC/HPF  RBC 3-10 RBC/HPF  BACTERIA MODERATE HPF  CRYSTALS NONE SEEN HPF  CASTS NONE SEEN LPF  Yeast FEW HPF   Procedure   Chaperone Present: Lauren.  Antibiotic prophylaxis: Ciprofloxacin  Procedure: Cystoscopy / Stent Removal  After consent was obtained, the patient was placed in the dorsal lithotomy position and prepped in the usual fashion. Flexible cystourethroscopy was performed and the indwelling left ureteral stent was identified and noted to be encrusted. The flexible grasping forceps were then used to attempt to remove the stent. The stent was  unable to be removed safely and therefore was left indwelling. The patient tolerated the procedure well and without complications.  poor visualualization    Assessment Assessed  1. 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 UA With REFLEX; [Do Not Release]; Status:Resulted - Requires Verification;   [id number] 03:03PM  1. Left UPJ  2. Retained left ureteral stent  I was unable to remove the stent in the office today due to poor visualization. We'll send her urine for repeat culture. We will schedule her for cystoscopy, left renal stent removal in the operating room in the near future.    Signatures Electronically signed by : Baruch Gouty, M.D.; May 02 2015  4:36PM EST

## 2015-05-31 NOTE — Anesthesia Postprocedure Evaluation (Signed)
Anesthesia Post Note  Patient: Tara Bullock  Procedure(s) Performed: Procedure(s) (LRB): CYSTOSCOPY WITH LEFT URETERAL  STENT REMOVAL,AND  RETROGRADE PYELOGRAM (Left)  Patient location during evaluation: PACU Anesthesia Type: General Level of consciousness: awake and alert Pain management: pain level controlled Vital Signs Assessment: post-procedure vital signs reviewed and stable Respiratory status: spontaneous breathing, nonlabored ventilation, respiratory function stable and patient connected to nasal cannula oxygen Cardiovascular status: blood pressure returned to baseline and stable Postop Assessment: no signs of nausea or vomiting Anesthetic complications: no    Last Vitals:  Filed Vitals:   05/31/15 0845 05/31/15 0857  BP: 144/90 140/86  Pulse: 77 79  Temp: 36.7 C 36.8 C  Resp: 12 14    Last Pain:  Filed Vitals:   05/31/15 0858  PainSc: 0-No pain                 Zenaida Deed

## 2015-05-31 NOTE — Anesthesia Procedure Notes (Signed)
Procedure Name: LMA Insertion Date/Time: 05/31/2015 7:40 AM Performed by: West Pugh Pre-anesthesia Checklist: Patient identified, Emergency Drugs available, Suction available, Patient being monitored and Timeout performed Patient Re-evaluated:Patient Re-evaluated prior to inductionOxygen Delivery Method: Circle system utilized Preoxygenation: Pre-oxygenation with 100% oxygen Intubation Type: IV induction Ventilation: Mask ventilation without difficulty LMA: LMA inserted LMA Size: 4.0 Number of attempts: 1 Placement Confirmation: positive ETCO2,  breath sounds checked- equal and bilateral and CO2 detector Tube secured with: Tape Dental Injury: Teeth and Oropharynx as per pre-operative assessment

## 2015-05-31 NOTE — Transfer of Care (Signed)
Immediate Anesthesia Transfer of Care Note  Patient: Tara Bullock  Procedure(s) Performed: Procedure(s): CYSTOSCOPY WITH LEFT URETERAL  STENT REMOVAL,AND  RETROGRADE PYELOGRAM (Left)  Patient Location: PACU  Anesthesia Type:General  Level of Consciousness:  sedated, patient cooperative and responds to stimulation  Airway & Oxygen Therapy:Patient Spontanous Breathing and Patient connected to face mask oxgen  Post-op Assessment:  Report given to PACU RN and Post -op Vital signs reviewed and stable  Post vital signs:  Reviewed and stable  Last Vitals:  Filed Vitals:   05/31/15 0532  BP: 131/84  Pulse: 97  Temp: 37 C  Resp: 16    Complications: No apparent anesthesia complications

## 2015-05-31 NOTE — Progress Notes (Signed)
Dr. [place] Pink notified of patient's CBG results in [place]

## 2015-09-27 DIAGNOSIS — F4522 Body dysmorphic disorder: Secondary | ICD-10-CM | POA: Insufficient documentation

## 2015-10-02 ENCOUNTER — Encounter (HOSPITAL_COMMUNITY): Payer: Self-pay

## 2015-10-02 DIAGNOSIS — N1330 Unspecified hydronephrosis: Secondary | ICD-10-CM | POA: Diagnosis present

## 2015-10-02 DIAGNOSIS — Z79899 Other long term (current) drug therapy: Secondary | ICD-10-CM

## 2015-10-02 DIAGNOSIS — E860 Dehydration: Secondary | ICD-10-CM | POA: Diagnosis present

## 2015-10-02 DIAGNOSIS — N179 Acute kidney failure, unspecified: Secondary | ICD-10-CM | POA: Diagnosis present

## 2015-10-02 DIAGNOSIS — K589 Irritable bowel syndrome without diarrhea: Secondary | ICD-10-CM | POA: Diagnosis present

## 2015-10-02 DIAGNOSIS — N136 Pyonephrosis: Secondary | ICD-10-CM | POA: Diagnosis present

## 2015-10-02 DIAGNOSIS — E109 Type 1 diabetes mellitus without complications: Secondary | ICD-10-CM | POA: Diagnosis present

## 2015-10-02 DIAGNOSIS — M797 Fibromyalgia: Secondary | ICD-10-CM | POA: Diagnosis present

## 2015-10-02 DIAGNOSIS — F329 Major depressive disorder, single episode, unspecified: Secondary | ICD-10-CM | POA: Diagnosis present

## 2015-10-02 DIAGNOSIS — F17210 Nicotine dependence, cigarettes, uncomplicated: Secondary | ICD-10-CM | POA: Diagnosis present

## 2015-10-02 DIAGNOSIS — Z8249 Family history of ischemic heart disease and other diseases of the circulatory system: Secondary | ICD-10-CM

## 2015-10-02 DIAGNOSIS — N12 Tubulo-interstitial nephritis, not specified as acute or chronic: Principal | ICD-10-CM | POA: Diagnosis present

## 2015-10-02 DIAGNOSIS — F419 Anxiety disorder, unspecified: Secondary | ICD-10-CM | POA: Diagnosis present

## 2015-10-02 DIAGNOSIS — M1990 Unspecified osteoarthritis, unspecified site: Secondary | ICD-10-CM | POA: Diagnosis present

## 2015-10-02 DIAGNOSIS — Z9119 Patient's noncompliance with other medical treatment and regimen: Secondary | ICD-10-CM

## 2015-10-02 DIAGNOSIS — Z794 Long term (current) use of insulin: Secondary | ICD-10-CM

## 2015-10-02 DIAGNOSIS — K219 Gastro-esophageal reflux disease without esophagitis: Secondary | ICD-10-CM | POA: Diagnosis present

## 2015-10-02 LAB — CBG MONITORING, ED: Glucose-Capillary: 591 mg/dL (ref 65–99)

## 2015-10-02 NOTE — ED Triage Notes (Addendum)
Patient bib [facility] c/o left flank pain.  Per EMS patient has a stent in her left kidney but, has begun to have extreme pain today.  Patient is also a diabetic and has trouble controlling her glucose and her CBG was 538.  Patient given 200 mcg en route and 4 mg zofran IV.

## 2015-10-03 ENCOUNTER — Emergency Department (HOSPITAL_COMMUNITY): Payer: Medicaid Other

## 2015-10-03 ENCOUNTER — Encounter (HOSPITAL_COMMUNITY): Payer: Self-pay | Admitting: Family Medicine

## 2015-10-03 ENCOUNTER — Inpatient Hospital Stay (HOSPITAL_COMMUNITY)
Admission: EM | Admit: 2015-10-03 | Discharge: 2015-10-05 | DRG: 690 | Disposition: A | Discharge status: Home / Self Care | Payer: Medicaid Other | Attending: Internal Medicine | Admitting: Internal Medicine

## 2015-10-03 DIAGNOSIS — E109 Type 1 diabetes mellitus without complications: Secondary | ICD-10-CM | POA: Diagnosis present

## 2015-10-03 DIAGNOSIS — F419 Anxiety disorder, unspecified: Secondary | ICD-10-CM | POA: Diagnosis present

## 2015-10-03 DIAGNOSIS — N1330 Unspecified hydronephrosis: Secondary | ICD-10-CM | POA: Diagnosis present

## 2015-10-03 DIAGNOSIS — D72829 Elevated white blood cell count, unspecified: Secondary | ICD-10-CM | POA: Diagnosis present

## 2015-10-03 DIAGNOSIS — Z794 Long term (current) use of insulin: Secondary | ICD-10-CM | POA: Diagnosis not present

## 2015-10-03 DIAGNOSIS — E860 Dehydration: Secondary | ICD-10-CM | POA: Diagnosis present

## 2015-10-03 DIAGNOSIS — F5000 Anorexia nervosa, unspecified: Secondary | ICD-10-CM

## 2015-10-03 DIAGNOSIS — N179 Acute kidney failure, unspecified: Secondary | ICD-10-CM | POA: Diagnosis present

## 2015-10-03 DIAGNOSIS — Z87448 Personal history of other diseases of urinary system: Secondary | ICD-10-CM | POA: Insufficient documentation

## 2015-10-03 DIAGNOSIS — M797 Fibromyalgia: Secondary | ICD-10-CM | POA: Diagnosis present

## 2015-10-03 DIAGNOSIS — K219 Gastro-esophageal reflux disease without esophagitis: Secondary | ICD-10-CM | POA: Diagnosis present

## 2015-10-03 DIAGNOSIS — N136 Pyonephrosis: Secondary | ICD-10-CM | POA: Diagnosis present

## 2015-10-03 DIAGNOSIS — Z91199 Patient's noncompliance with other medical treatment and regimen due to unspecified reason: Secondary | ICD-10-CM

## 2015-10-03 DIAGNOSIS — Z720 Tobacco use: Secondary | ICD-10-CM | POA: Diagnosis present

## 2015-10-03 DIAGNOSIS — Z8249 Family history of ischemic heart disease and other diseases of the circulatory system: Secondary | ICD-10-CM | POA: Diagnosis not present

## 2015-10-03 DIAGNOSIS — N12 Tubulo-interstitial nephritis, not specified as acute or chronic: Secondary | ICD-10-CM | POA: Diagnosis not present

## 2015-10-03 DIAGNOSIS — F329 Major depressive disorder, single episode, unspecified: Secondary | ICD-10-CM | POA: Diagnosis present

## 2015-10-03 DIAGNOSIS — M1990 Unspecified osteoarthritis, unspecified site: Secondary | ICD-10-CM | POA: Diagnosis present

## 2015-10-03 DIAGNOSIS — F17210 Nicotine dependence, cigarettes, uncomplicated: Secondary | ICD-10-CM | POA: Diagnosis present

## 2015-10-03 DIAGNOSIS — Z9119 Patient's noncompliance with other medical treatment and regimen: Secondary | ICD-10-CM

## 2015-10-03 DIAGNOSIS — Z79899 Other long term (current) drug therapy: Secondary | ICD-10-CM | POA: Diagnosis not present

## 2015-10-03 DIAGNOSIS — K589 Irritable bowel syndrome without diarrhea: Secondary | ICD-10-CM | POA: Diagnosis present

## 2015-10-03 LAB — BLOOD GAS, VENOUS
Acid-base deficit: 3.3 mmol/L — ABNORMAL HIGH (ref 0.0–2.0)
BICARBONATE: 22.1 mmol/L (ref 20.0–28.0)
O2 SAT: 97.4 %
PATIENT TEMPERATURE: 98.6
PH VEN: 7.326 (ref 7.250–7.430)
PO2 VEN: 103 mmHg — AB (ref 32.0–45.0)
pCO2, Ven: 43.5 mmHg — ABNORMAL LOW (ref 44.0–60.0)

## 2015-10-03 LAB — URINALYSIS, ROUTINE W REFLEX MICROSCOPIC
Bilirubin Urine: NEGATIVE
KETONES UR: NEGATIVE mg/dL
Nitrite: NEGATIVE
Specific Gravity, Urine: 1.022 (ref 1.005–1.030)
pH: 6.5 (ref 5.0–8.0)

## 2015-10-03 LAB — CBC WITH DIFFERENTIAL/PLATELET
BASOS ABS: 0 10*3/uL (ref 0.0–0.1)
BASOS PCT: 0 %
Band Neutrophils: 10 %
Eosinophils Absolute: 0.2 10*3/uL (ref 0.0–0.7)
Eosinophils Relative: 1 %
HCT: 34 % — ABNORMAL LOW (ref 36.0–46.0)
Hemoglobin: 11.3 g/dL — ABNORMAL LOW (ref 12.0–15.0)
LYMPHS ABS: 0.5 10*3/uL — AB (ref 0.7–4.0)
Lymphocytes Relative: 3 %
MCH: 26.5 pg (ref 26.0–34.0)
MCHC: 33.2 g/dL (ref 30.0–36.0)
MCV: 79.6 fL (ref 78.0–100.0)
MONO ABS: 0.7 10*3/uL (ref 0.1–1.0)
Monocytes Relative: 4 %
NEUTROS PCT: 82 %
Neutro Abs: 15.5 10*3/uL — ABNORMAL HIGH (ref 1.7–7.7)
PLATELETS: 203 10*3/uL (ref 150–400)
RBC: 4.27 MIL/uL (ref 3.87–5.11)
RDW: 12.7 % (ref 11.5–15.5)
WBC: 16.9 10*3/uL — AB (ref 4.0–10.5)

## 2015-10-03 LAB — PREGNANCY, URINE: Preg Test, Ur: NEGATIVE

## 2015-10-03 LAB — COMPREHENSIVE METABOLIC PANEL
ALK PHOS: 101 U/L (ref 38–126)
ALT: 14 U/L (ref 14–54)
AST: 12 U/L — ABNORMAL LOW (ref 15–41)
Albumin: 2.9 g/dL — ABNORMAL LOW (ref 3.5–5.0)
Anion gap: 10 (ref 5–15)
BUN: 35 mg/dL — ABNORMAL HIGH (ref 6–20)
CALCIUM: 8.3 mg/dL — AB (ref 8.9–10.3)
CO2: 23 mmol/L (ref 22–32)
CREATININE: 1.64 mg/dL — AB (ref 0.44–1.00)
Chloride: 102 mmol/L (ref 101–111)
GFR, EST AFRICAN AMERICAN: 48 mL/min — AB (ref >60)
GFR, EST NON AFRICAN AMERICAN: 41 mL/min — AB (ref >60)
Glucose, Bld: 507 mg/dL (ref 65–99)
Potassium: 4.3 mmol/L (ref 3.5–5.1)
Sodium: 135 mmol/L (ref 135–145)
Total Bilirubin: 0.6 mg/dL (ref 0.3–1.2)
Total Protein: 6 g/dL — ABNORMAL LOW (ref 6.5–8.1)

## 2015-10-03 LAB — GLUCOSE, CAPILLARY
GLUCOSE-CAPILLARY: 166 mg/dL — AB (ref 65–99)
Glucose-Capillary: 368 mg/dL — ABNORMAL HIGH (ref 65–99)
Glucose-Capillary: 330 mg/dL — ABNORMAL HIGH (ref 65–99)
Glucose-Capillary: 122 mg/dL — ABNORMAL HIGH (ref 65–99)

## 2015-10-03 LAB — I-STAT TROPONIN, ED: Troponin i, poc: 0.03 ng/mL (ref 0.00–0.08)

## 2015-10-03 LAB — URINE MICROSCOPIC-ADD ON

## 2015-10-03 LAB — CBG MONITORING, ED: Glucose-Capillary: 422 mg/dL — ABNORMAL HIGH (ref 65–99)

## 2015-10-03 LAB — I-STAT CG4 LACTIC ACID, ED: Lactic Acid, Venous: 1 mmol/L (ref 0.5–1.9)

## 2015-10-03 MED ORDER — INSULIN GLARGINE 100 UNIT/ML ~~LOC~~ SOLN
16.0000 [IU] | Freq: Every day | SUBCUTANEOUS | Status: DC
  Administered 2015-10-04: 16 [IU] via SUBCUTANEOUS
  Filled 2015-10-03: qty 0.16

## 2015-10-03 MED ORDER — DEXTROSE 5 % IV SOLN
1.0000 g | Freq: Once | INTRAVENOUS | Status: AC
  Administered 2015-10-03: 1 g via INTRAVENOUS
  Filled 2015-10-03: qty 10

## 2015-10-03 MED ORDER — SENNOSIDES-DOCUSATE SODIUM 8.6-50 MG PO TABS: 1.0000 | ORAL_TABLET | Freq: Every evening | ORAL | Status: DC | PRN

## 2015-10-03 MED ORDER — INSULIN ASPART 100 UNIT/ML ~~LOC~~ SOLN: 0.0000 [IU] | Freq: Every day | SUBCUTANEOUS | Status: DC

## 2015-10-03 MED ORDER — PREDNISOLONE ACETATE 1 % OP SUSP
2.0000 [drp] | Freq: Two times a day (BID) | OPHTHALMIC | Status: DC
  Administered 2015-10-03 – 2015-10-05 (×3): 2 [drp] via OPHTHALMIC
  Filled 2015-10-03: qty 1

## 2015-10-03 MED ORDER — ONDANSETRON HCL 4 MG/2ML IJ SOLN
4.0000 mg | Freq: Once | INTRAMUSCULAR | Status: AC
  Administered 2015-10-03: 4 mg via INTRAVENOUS
  Filled 2015-10-03: qty 2

## 2015-10-03 MED ORDER — INSULIN ASPART 100 UNIT/ML ~~LOC~~ SOLN
12.0000 [IU] | Freq: Once | SUBCUTANEOUS | Status: AC
  Administered 2015-10-03: 12 [IU] via SUBCUTANEOUS

## 2015-10-03 MED ORDER — ACETAMINOPHEN 325 MG PO TABS: 650.0000 mg | ORAL_TABLET | Freq: Four times a day (QID) | ORAL | Status: DC | PRN

## 2015-10-03 MED ORDER — INSULIN ASPART 100 UNIT/ML ~~LOC~~ SOLN
0.0000 [IU] | Freq: Three times a day (TID) | SUBCUTANEOUS | Status: DC
  Administered 2015-10-03: 7 [IU] via SUBCUTANEOUS

## 2015-10-03 MED ORDER — ENOXAPARIN SODIUM 40 MG/0.4ML ~~LOC~~ SOLN
40.0000 mg | SUBCUTANEOUS | Status: DC
  Filled 2015-10-03 (×2): qty 0.4

## 2015-10-03 MED ORDER — DEXTROSE 5 % IV SOLN
2.0000 g | INTRAVENOUS | Status: DC
  Administered 2015-10-04: 2 g via INTRAVENOUS
  Filled 2015-10-03 (×2): qty 2

## 2015-10-03 MED ORDER — INSULIN ASPART 100 UNIT/ML ~~LOC~~ SOLN
0.0000 [IU] | Freq: Three times a day (TID) | SUBCUTANEOUS | Status: DC
  Administered 2015-10-03: 1 [IU] via SUBCUTANEOUS

## 2015-10-03 MED ORDER — ONDANSETRON HCL 4 MG/2ML IJ SOLN: 4.0000 mg | Freq: Four times a day (QID) | INTRAMUSCULAR | Status: DC | PRN

## 2015-10-03 MED ORDER — INSULIN ASPART 100 UNIT/ML ~~LOC~~ SOLN
6.0000 [IU] | Freq: Three times a day (TID) | SUBCUTANEOUS | Status: DC
  Administered 2015-10-04 – 2015-10-05 (×3): 6 [IU] via SUBCUTANEOUS

## 2015-10-03 MED ORDER — HYOSCYAMINE SULFATE 0.125 MG PO TABS
0.1250 mg | ORAL_TABLET | ORAL | Status: DC | PRN
  Filled 2015-10-03: qty 1

## 2015-10-03 MED ORDER — HYDROCODONE-ACETAMINOPHEN 5-325 MG PO TABS
1.0000 | ORAL_TABLET | ORAL | Status: DC | PRN
  Administered 2015-10-03: 1 via ORAL
  Administered 2015-10-03 – 2015-10-05 (×4): 2 via ORAL
  Filled 2015-10-03 (×6): qty 2

## 2015-10-03 MED ORDER — MORPHINE SULFATE (PF) 2 MG/ML IV SOLN
1.0000 mg | INTRAVENOUS | Status: DC | PRN
  Administered 2015-10-03 (×2): 1 mg via INTRAVENOUS
  Filled 2015-10-03 (×2): qty 1

## 2015-10-03 MED ORDER — ALPRAZOLAM 0.25 MG PO TABS
0.2500 mg | ORAL_TABLET | Freq: Two times a day (BID) | ORAL | Status: DC | PRN
  Administered 2015-10-04: 0.25 mg via ORAL
  Filled 2015-10-03: qty 1

## 2015-10-03 MED ORDER — ALPRAZOLAM 0.5 MG PO TABS: 0.5000 mg | ORAL_TABLET | Freq: Two times a day (BID) | ORAL | Status: DC | PRN

## 2015-10-03 MED ORDER — FENTANYL CITRATE (PF) 100 MCG/2ML IJ SOLN
50.0000 ug | INTRAMUSCULAR | Status: DC | PRN
  Administered 2015-10-03: 50 ug via INTRAVENOUS

## 2015-10-03 MED ORDER — INSULIN ASPART 100 UNIT/ML ~~LOC~~ SOLN: 3.0000 [IU] | Freq: Three times a day (TID) | SUBCUTANEOUS | Status: DC

## 2015-10-03 MED ORDER — NICOTINE 14 MG/24HR TD PT24: 14.0000 mg | MEDICATED_PATCH | Freq: Every day | TRANSDERMAL | Status: DC | PRN

## 2015-10-03 MED ORDER — SODIUM CHLORIDE 0.9 % IV SOLN
INTRAVENOUS | Status: AC
  Administered 2015-10-03 – 2015-10-04 (×4): via INTRAVENOUS

## 2015-10-03 MED ORDER — INSULIN GLARGINE 100 UNIT/ML ~~LOC~~ SOLN
12.0000 [IU] | Freq: Once | SUBCUTANEOUS | Status: AC
  Administered 2015-10-03: 12 [IU] via SUBCUTANEOUS
  Filled 2015-10-03: qty 0.12

## 2015-10-03 MED ORDER — ONDANSETRON HCL 4 MG PO TABS: 4.0000 mg | ORAL_TABLET | Freq: Four times a day (QID) | ORAL | Status: DC | PRN

## 2015-10-03 MED ORDER — SODIUM CHLORIDE 0.9 % IV BOLUS (SEPSIS)
1000.0000 mL | Freq: Once | INTRAVENOUS | Status: AC
  Administered 2015-10-03: 1000 mL via INTRAVENOUS

## 2015-10-03 MED ORDER — GABAPENTIN 300 MG PO CAPS: 600.0000 mg | ORAL_CAPSULE | Freq: Three times a day (TID) | ORAL | Status: DC

## 2015-10-03 MED ORDER — FENTANYL CITRATE (PF) 100 MCG/2ML IJ SOLN
INTRAMUSCULAR | Status: AC
  Filled 2015-10-03: qty 2

## 2015-10-03 MED ORDER — INSULIN GLARGINE 100 UNIT/ML ~~LOC~~ SOLN: 12.0000 [IU] | Freq: Every day | SUBCUTANEOUS | Status: DC

## 2015-10-03 MED ORDER — INSULIN ASPART 100 UNIT/ML ~~LOC~~ SOLN
0.0000 [IU] | Freq: Three times a day (TID) | SUBCUTANEOUS | Status: DC
  Administered 2015-10-04: 5 [IU] via SUBCUTANEOUS
  Administered 2015-10-04: 7 [IU] via SUBCUTANEOUS
  Administered 2015-10-05: 1 [IU] via SUBCUTANEOUS

## 2015-10-03 MED ORDER — GABAPENTIN 300 MG PO CAPS
600.0000 mg | ORAL_CAPSULE | ORAL | Status: DC
  Administered 2015-10-03 – 2015-10-05 (×5): 600 mg via ORAL
  Filled 2015-10-03 (×5): qty 2

## 2015-10-03 MED ORDER — DULOXETINE HCL 30 MG PO CPEP
90.0000 mg | ORAL_CAPSULE | Freq: Every morning | ORAL | Status: DC
  Administered 2015-10-03 – 2015-10-05 (×3): 90 mg via ORAL
  Filled 2015-10-03 (×3): qty 3

## 2015-10-03 MED ORDER — ENSURE ENLIVE PO LIQD: 237.0000 mL | Freq: Every day | ORAL | Status: DC

## 2015-10-03 MED ORDER — ACETAMINOPHEN 650 MG RE SUPP: 650.0000 mg | Freq: Four times a day (QID) | RECTAL | Status: DC | PRN

## 2015-10-03 MED ORDER — MORPHINE SULFATE (PF) 4 MG/ML IV SOLN
4.0000 mg | Freq: Once | INTRAVENOUS | Status: AC
Start: 2015-10-03 — End: 2015-10-03
  Administered 2015-10-03: 4 mg via INTRAVENOUS
  Filled 2015-10-03: qty 1

## 2015-10-03 MED ORDER — INSULIN ASPART 100 UNIT/ML ~~LOC~~ SOLN: 14.0000 [IU] | Freq: Once | SUBCUTANEOUS | Status: DC

## 2015-10-03 MED ORDER — GABAPENTIN 300 MG PO CAPS
900.0000 mg | ORAL_CAPSULE | Freq: Every day | ORAL | Status: DC
  Administered 2015-10-03 – 2015-10-04 (×2): 900 mg via ORAL
  Filled 2015-10-03 (×2): qty 3

## 2015-10-03 MED ORDER — MORPHINE SULFATE (PF) 4 MG/ML IV SOLN
4.0000 mg | Freq: Once | INTRAVENOUS | Status: AC
  Administered 2015-10-03: 4 mg via INTRAVENOUS
  Filled 2015-10-03: qty 1

## 2015-10-03 MED ORDER — GLUCERNA 1.2 CAL PO LIQD
237.0000 mL | Freq: Two times a day (BID) | ORAL | Status: DC
Start: 2015-10-03 — End: 2015-10-05
  Filled 2015-10-03 (×6): qty 237

## 2015-10-03 NOTE — ED Notes (Signed)
Per amy in lab, Glucose 507, notified RN and MD

## 2015-10-03 NOTE — Progress Notes (Signed)
Pharmacy Antibiotic Note  Tara Bullock is a 30 y.o. female admitted on 10/03/2015 with pyelonephritis .  Pharmacy has been consulted for ceftriaxone dosing.  Plan:  Ceftriaxone 2g IV q24h  No further dosing adjustments needed, Pharmacy will sign off  Height: 5\' 1"  (154.9 cm) Weight: 107 lb (48.5 kg) IBW/kg (Calculated) : 47.8  Temp (24hrs), Avg:98 F (36.7 C), Min:97.7 F (36.5 C), Max:98.2 F (36.8 C)   Recent Labs Lab 10/03/15 0250 10/03/15 0309  WBC 16.9*  --   CREATININE 1.64*  --   LATICACIDVEN  --  1.00    Estimated Creatinine Clearance: 38.2 mL/min (by C-G formula based on SCr of 1.64 mg/dL (H)).    Allergies  Allergen Reactions  . Ivp Dye [Iodinated Diagnostic Agents] Anaphylaxis and Hives  . Adhesive [Tape] Hives and Rash    Antimicrobials this admission: 10/2 Ceftriaxone >>  Dose adjustments this admission: ---  Microbiology results: 10/2 BCx: sent  Thank you for allowing pharmacy to be a part of this patient's care.  Peggyann Juba, PharmD, BCPS Pager: [phone] 10/03/2015 9:47 AM

## 2015-10-03 NOTE — ED Notes (Signed)
Report given to Abbby RN.

## 2015-10-03 NOTE — ED Provider Notes (Signed)
[facility] DEPT Provider Note   CSN: [id number] Arrival date & time: 10/02/15  2350  By signing my name below, I, Neta Mends, attest that this documentation has been prepared under the direction and in the presence of Naphtali Zywicki Julio Alm, MD . Electronically Signed: Neta Mends, ED Scribe. 10/03/2015. 1:52 AM.    History   Chief Complaint Chief Complaint  Patient presents with  . Flank Pain    The history is provided by the patient. No language interpreter was used.   HPI Comments:  Tara Bullock is a 30 y.o. female PMHx of DM, eating disorder who presents to the Emergency Department complaining of constant L sided flank pain that began a few hours ago. Pt states that this pain is similar to that of a previous kidney infection. Pt states that she had to have a stent placed in L kidney. Pt complains of associated vomiting.Pt has not urinated since the pain began.  Pt denies fever, urinary symptoms prior to onset.   Past Medical History:  Diagnosis Date  . Anorexia nervosa   . Anxiety   . Arthritis   . Depression   . Fibromyalgia   . GERD (gastroesophageal reflux disease)   . History of hydronephrosis    left ureteral placement 04-02-2015  resolved  . IBS (irritable bowel syndrome)   . Renal cyst, left   . Retained ureteral stent    left  . Rheumatoid factor positive   . Uncontrolled insulin dependent type 1 diabetes mellitus ([place]) followed by dr Veva Holes ([facility] in [place])   last A1c  12.9 on 04-02-2015  . Wears glasses     Patient Active Problem List   Diagnosis Date Noted  . Pyelonephritis 10/03/2015  . AKI (acute kidney injury) ([place]) 10/03/2015  . Hyperglycemia 04/02/2015  . Diabetic hyperosmolar non-ketotic state ([place]) 04/02/2015  . Hydronephrosis, left 04/02/2015  . Allergic rhinitis 04/01/2014  . Anorexia nervosa 11/06/2013  . DM type 1, not at goal [facility]) 09/16/2011  . Dehydration 09/16/2011  . Hyponatremia  (Pseudo) due to DKA 09/16/2011  . Leukocytosis 09/16/2011  . Bronchitis, acute 09/16/2011  . Influenza-like illness 09/16/2011  . Tobacco abuse 09/16/2011  . Medical non-compliance 09/16/2011    Past Surgical History:  Procedure Laterality Date  . CYSTOSCOPY W/ URETERAL STENT REMOVAL Left 05/31/2015   Procedure: CYSTOSCOPY WITH LEFT URETERAL  STENT REMOVAL,AND  RETROGRADE PYELOGRAM;  Surgeon: Nickie Retort, MD;  Location: [facility];  Service: Urology;  Laterality: Left;  . CYSTOSCOPY WITH STENT PLACEMENT Left 04/02/2015   Procedure: CYSTOSCOPY, RETROGRADE PYELOGRAM WITH LEFT STENT PLACEMENT;  Surgeon: Nickie Retort, MD;  Location: [facility];  Service: Urology;  Laterality: Left;  . LEEP  2009    OB History    No data available       Home Medications    Prior to Admission medications   Medication Sig Start Date End Date Taking? Authorizing Provider  ALPRAZolam Duanne Moron) 0.5 MG tablet Take 0.5-1 mg by mouth 2 (two) times daily as needed for sleep or anxiety.    Yes Historical Provider, MD  CYMBALTA 30 MG capsule Take 90 mg by mouth every morning.  10/26/13  Yes Historical Provider, MD  feeding supplement, ENSURE ENLIVE, (ENSURE ENLIVE) LIQD Take 237 mLs by mouth daily.   Yes Historical Provider, MD  fluticasone (FLONASE ALLERGY RELIEF) 50 MCG/ACT nasal spray Place 1 spray into both nostrils daily. Patient taking differently: Place 1 spray into both nostrils daily as needed for  allergies.  04/01/14  Yes Hilton Sinclair, MD  gabapentin (NEURONTIN) 300 MG capsule Take 600-900 mg by mouth 3 (three) times daily. Take 600mg  capsule in the morning, 600 mg in the afternoon and 900 mg at bedtime 10/26/13  Yes Historical Provider, MD  hyoscyamine (LEVSIN, ANASPAZ) 0.125 MG tablet Take 0.125 mg by mouth every 4 (four) hours as needed for cramping.   Yes Historical Provider, MD  insulin lispro (HUMALOG) 100 UNIT/ML injection Inject 4 Units into the skin. Once daily at the largest meal   Yes  Historical Provider, MD  LEVEMIR FLEXTOUCH 100 UNIT/ML Pen Inject 18 Units into the skin every morning.  08/27/13  Yes Historical Provider, MD  levonorgestrel (MIRENA) 20 MCG/24HR IUD 1 each by Intrauterine route once.    Yes Historical Provider, MD  prednisoLONE acetate (PRED FORTE) 1 % ophthalmic suspension Place 2 drops into the left eye 2 (two) times daily.   Yes Historical Provider, MD  promethazine (PHENERGAN) 12.5 MG tablet Take 12.5 mg by mouth every 6 (six) hours as needed for nausea or vomiting.    Yes Historical Provider, MD  traMADol (ULTRAM) 50 MG tablet Take 100 mg by mouth 3 (three) times daily. Take 100mg  in the morning, 100mg  in the afternoon and 100 mg at bedtime 10/28/13  Yes Historical Provider, MD  ciprofloxacin (CIPRO) 500 MG tablet Take 1 tablet (500 mg total) by mouth 2 (two) times daily. 10/05/15   Florencia Reasons, MD    Family History Family History  Problem Relation Age of Onset  . Hypertension Mother   . Hypertension Father     Social History Social History  Substance Use Topics  . Smoking status: Current Every Day Smoker    Packs/day: 1.00    Years: 15.00    Types: Cigarettes  . Smokeless tobacco: Never Used  . Alcohol use Yes     Comment: rare     Allergies   Ivp dye [iodinated diagnostic agents] and Adhesive [tape]   Review of Systems Review of Systems  All other systems reviewed and are negative.    Physical Exam Updated Vital Signs BP (!) 166/84 (BP Location: Left Arm)   Pulse 87   Temp 98.2 F (36.8 C) (Oral)   Resp 20   Ht 5\' 1"  (1.549 m)   Wt 107 lb (48.5 kg)   SpO2 100%   BMI 20.22 kg/m   Physical Exam  Constitutional: She appears well-developed and well-nourished. No distress.  L CVA tenderness   HENT:  Head: Normocephalic and atraumatic.  Eyes: Conjunctivae are normal.  Cardiovascular: Normal rate.   tachycardia no murmurs;   Pulmonary/Chest: Effort normal.  clear oscultation bilaterally   Abdominal: She exhibits no  distension.  Neurological: She is alert.  Skin: Skin is warm and dry.  Psychiatric: She has a normal mood and affect.  Nursing note and vitals reviewed.    ED Treatments / Results  DIAGNOSTIC STUDIES:  Oxygen Saturation is 100% on RA, normal by my interpretation.    COORDINATION OF CARE:  1:52 AM Discussed treatment plan with pt at bedside and pt agreed to plan.   Labs (all labs ordered are listed, but only abnormal results are displayed) Labs Reviewed  COMPREHENSIVE METABOLIC PANEL - Abnormal; Notable for the following:       Result Value   Glucose, Bld 507 (*)    BUN 35 (*)    Creatinine, Ser 1.64 (*)    Calcium 8.3 (*)    Total Protein 6.0 (*)  Albumin 2.9 (*)    AST 12 (*)    GFR calc non Af Amer 41 (*)    GFR calc Af Amer 48 (*)    All other components within normal limits  CBC WITH DIFFERENTIAL/PLATELET - Abnormal; Notable for the following:    WBC 16.9 (*)    Hemoglobin 11.3 (*)    HCT 34.0 (*)    Neutro Abs 15.5 (*)    Lymphs Abs 0.5 (*)    All other components within normal limits  URINALYSIS, ROUTINE W REFLEX MICROSCOPIC (NOT AT [facility]) - Abnormal; Notable for the following:    APPearance CLOUDY (*)    Glucose, UA >1000 (*)    Hgb urine dipstick LARGE (*)    Protein, ur >300 (*)    Leukocytes, UA TRACE (*)    All other components within normal limits  URINE MICROSCOPIC-ADD ON - Abnormal; Notable for the following:    Squamous Epithelial / LPF 6-30 (*)    Bacteria, UA FEW (*)    All other components within normal limits  BLOOD GAS, VENOUS - Abnormal; Notable for the following:    pCO2, Ven 43.5 (*)    pO2, Ven 103.0 (*)    Acid-base deficit 3.3 (*)    All other components within normal limits  GLUCOSE, CAPILLARY - Abnormal; Notable for the following:    Glucose-Capillary 368 (*)    All other components within normal limits  HEMOGLOBIN A1C - Abnormal; Notable for the following:    Hgb A1c MFr Bld 10.2 (*)    All other components within normal  limits  GLUCOSE, CAPILLARY - Abnormal; Notable for the following:    Glucose-Capillary 330 (*)    All other components within normal limits  COMPREHENSIVE METABOLIC PANEL - Abnormal; Notable for the following:    Glucose, Bld 258 (*)    BUN 36 (*)    Creatinine, Ser 1.32 (*)    Calcium 8.0 (*)    Total Protein 5.3 (*)    Albumin 2.3 (*)    AST 11 (*)    ALT 11 (*)    GFR calc non Af Amer 54 (*)    Anion gap 4 (*)    All other components within normal limits  CBC - Abnormal; Notable for the following:    WBC 12.5 (*)    RBC 3.78 (*)    Hemoglobin 10.2 (*)    HCT 30.5 (*)    All other components within normal limits  GLUCOSE, CAPILLARY - Abnormal; Notable for the following:    Glucose-Capillary 122 (*)    All other components within normal limits  GLUCOSE, CAPILLARY - Abnormal; Notable for the following:    Glucose-Capillary 166 (*)    All other components within normal limits  GLUCOSE, CAPILLARY - Abnormal; Notable for the following:    Glucose-Capillary 252 (*)    All other components within normal limits  GLUCOSE, CAPILLARY - Abnormal; Notable for the following:    Glucose-Capillary 315 (*)    All other components within normal limits  GLUCOSE, CAPILLARY - Abnormal; Notable for the following:    Glucose-Capillary 52 (*)    All other components within normal limits  BASIC METABOLIC PANEL - Abnormal; Notable for the following:    Glucose, Bld 180 (*)    BUN 29 (*)    Calcium 8.3 (*)    Anion gap 3 (*)    All other components within normal limits  CBC - Abnormal; Notable for the following:  RBC 3.41 (*)    Hemoglobin 9.0 (*)    HCT 27.7 (*)    All other components within normal limits  GLUCOSE, CAPILLARY - Abnormal; Notable for the following:    Glucose-Capillary 256 (*)    All other components within normal limits  GLUCOSE, CAPILLARY - Abnormal; Notable for the following:    Glucose-Capillary 136 (*)    All other components within normal limits  CBG MONITORING,  ED - Abnormal; Notable for the following:    Glucose-Capillary 591 (*)    All other components within normal limits  CBG MONITORING, ED - Abnormal; Notable for the following:    Glucose-Capillary 422 (*)    All other components within normal limits  CULTURE, BLOOD (ROUTINE X 2)  CULTURE, BLOOD (ROUTINE X 2)  URINE CULTURE  PREGNANCY, URINE  GLUCOSE, CAPILLARY  I-STAT CG4 LACTIC ACID, ED  I-STAT TROPOININ, ED  I-STAT VENOUS BLOOD GAS, ED    EKG  EKG Interpretation None       Radiology No results found.  Procedures Procedures (including critical care time)  Medications Ordered in ED Medications  fentaNYL (SUBLIMAZE) 100 MCG/2ML injection (  Not Given 10/03/15 0039)  0.9 %  sodium chloride infusion ( Intravenous Stopped 10/04/15 2100)  0.9 %  sodium chloride infusion ( Intravenous Not Given 10/04/15 2123)  sodium chloride 0.9 % bolus 1,000 mL (0 mLs Intravenous Stopped 10/03/15 0247)  morphine 4 MG/ML injection 4 mg (4 mg Intravenous Given 10/03/15 0246)  ondansetron (ZOFRAN) injection 4 mg (4 mg Intravenous Given 10/03/15 0246)  sodium chloride 0.9 % bolus 1,000 mL (0 mLs Intravenous Stopped 10/03/15 0429)  cefTRIAXone (ROCEPHIN) 1 g in dextrose 5 % 50 mL IVPB (0 g Intravenous Stopped 10/03/15 0710)  ondansetron (ZOFRAN) injection 4 mg (4 mg Intravenous Given 10/03/15 0532)  morphine 4 MG/ML injection 4 mg (4 mg Intravenous Given 10/03/15 0532)  insulin glargine (LANTUS) injection 12 Units (12 Units Subcutaneous Given 10/03/15 0947)  insulin aspart (novoLOG) injection 12 Units (12 Units Subcutaneous Given 10/03/15 0904)  cefTRIAXone (ROCEPHIN) 1 g in dextrose 5 % 50 mL IVPB (1 g Intravenous Given 10/03/15 0946)  insulin glargine (LANTUS) injection 2 Units (2 Units Subcutaneous Given 10/04/15 1329)  ALPRAZolam (XANAX) tablet 0.25 mg (0.25 mg Oral Given 10/04/15 2248)     Initial Impression / Assessment and Plan / ED Course  I have reviewed the triage vital signs and the nursing  notes.  Pertinent labs & imaging results that were available during my care of the patient were reviewed by me and considered in my medical decision making (see chart for details).  Clinical Course    Pt is a 50 y9o female w history of L sided pyelo and stent presentign with bilateral flank pain and urianry symptoms. Patient has been vomiting.  Initially hypertensive, in pain.  Has also had trouble with her surgars with the vomiting etc.   CT shows bilateral hydro/pyelo.  Given her inability to take PO and her requirement of pain meds, will admit to hosptilist for pyelo. '    Final Clinical Impressions(s) / ED Diagnoses   Final diagnoses:  DM type 1, not at goal [facility])  Dehydration  Leukocytosis, unspecified type  Tobacco abuse  Medical non-compliance  Pyelonephritis  AKI (acute kidney injury) ([place])  Anorexia nervosa  Hydronephrosis, left    New Prescriptions Discharge Medication List as of 10/05/2015 10:20 AM    START taking these medications   Details  ciprofloxacin (CIPRO) 500 MG tablet  Take 1 tablet (500 mg total) by mouth 2 (two) times daily., Starting Wed 10/05/2015, Normal      I personally performed the services described in this documentation, which was scribed in my presence. The recorded information has been reviewed and is accurate.      Antoinette Haskett Julio Alm, MD 10/07/15 [phone]

## 2015-10-03 NOTE — Progress Notes (Signed)
Inpatient Diabetes Program Recommendations  AACE/ADA: New Consensus Statement on Inpatient Glycemic Control (2015)  Target Ranges:  Prepandial:   less than 140 mg/dL      Peak postprandial:   less than 180 mg/dL (1-2 hours)      Critically ill patients:  140 - 180 mg/dL   Lab Results  Component Value Date   GLUCAP 330 (H) 10/03/2015   HGBA1C 12.6 (H) 04/02/2015    Review of Glycemic Control  Diabetes history: DM1 Outpatient Diabetes medications: Levemir 18 units QAM, Humalog 4 units tidwc Current orders for Inpatient glycemic control: Lantus 16 units QAM, Novolog moderate tidwc and hs +6 units tidwc  HgbA1C pending. Last one 04/02/2015 - 12.6%.   Inpatient Diabetes Program Recommendations:    Decrease Novolog to 0-9 units tidwc since pt is Type 1.  Spoke with pt at length regarding her diabetes control. Pt states she has an eating disorder and this has affected her glucose control, as she may not take her insulin as prescribed if her eating varies. Sees endo every 3 months. Is working with therapist on eating disorder. Has had DM1 since age 30 and states "it really has never been in good control, except for right after diagnosis." Encouraged her to take meal coverage insulin after meals instead of prior to meals as she may not eat, afterall.  Will continue to follow.  Thank you. Lorenda Peck, RD, LDN, CDE Inpatient Diabetes Coordinator [phone]

## 2015-10-03 NOTE — H&P (Signed)
History and Physical  Tara Bullock WNI:[id number] DOB: 01-02-1985 DOA: 10/03/2015  Referring physician: Theodoro Clock, MD PCP: Leamon Arnt, MD  Outpatient Specialists: [facility]  Chief Complaint: left flank pain  HPI: Tara Bullock is a 30 y.o. female with poorly controlled type 1 DM (most recent A1c >12), anorexia, tobacco abuse, history of hydronephrosis left kidney requiring stent placement (removed several months ago), sepsis and UTI, DKA presented to the ED complaining of severe left flank pain, nausea and vomiting, malaise and dysuria.  Patient also has been severely dehydrated. Her family reports that she has not been able to keep down food and drink. She was evaluated in the emergency department and found to have a urinary tract infection. She was severely dehydrated and her blood sugar was nearly 600.  She had a CT of the abdomen and pelvis that revealed mild bilateral hydronephrosis with edematous appearance of the kidneys concerning for pyelonephritis. She was given IV fluid hydration and IV ceftriaxone and hospital admission was requested. She was not noted to be in DKA.  Review of Systems: All systems reviewed and apart from history of presenting illness, are negative.  Past Medical History:  Diagnosis Date  . Anorexia nervosa   . Anxiety   . Arthritis   . Depression   . Fibromyalgia   . GERD (gastroesophageal reflux disease)   . History of hydronephrosis    left ureteral placement 04-02-2015  resolved  . IBS (irritable bowel syndrome)   . Renal cyst, left   . Retained ureteral stent    left  . Rheumatoid factor positive   . Uncontrolled insulin dependent type 1 diabetes mellitus ([place]) followed by dr Veva Holes ([facility] in [place])   last A1c  12.9 on 04-02-2015  . Wears glasses    Past Surgical History:  Procedure Laterality Date  . CYSTOSCOPY W/ URETERAL STENT REMOVAL Left 05/31/2015   Procedure: CYSTOSCOPY WITH LEFT URETERAL   STENT REMOVAL,AND  RETROGRADE PYELOGRAM;  Surgeon: Nickie Retort, MD;  Location: [facility];  Service: Urology;  Laterality: Left;  . CYSTOSCOPY WITH STENT PLACEMENT Left 04/02/2015   Procedure: CYSTOSCOPY, RETROGRADE PYELOGRAM WITH LEFT STENT PLACEMENT;  Surgeon: Nickie Retort, MD;  Location: [facility];  Service: Urology;  Laterality: Left;  . LEEP  2009   Social History:  reports that she has been smoking Cigarettes.  She has a 15.00 pack-year smoking history. She has never used smokeless tobacco. She reports that she drinks alcohol. She reports that she does not use drugs.   Allergies  Allergen Reactions  . Ivp Dye [Iodinated Diagnostic Agents] Anaphylaxis and Hives  . Adhesive [Tape] Hives and Rash    Family History  Problem Relation Age of Onset  . Hypertension Mother   . Hypertension Father     Prior to Admission medications   Medication Sig Start Date End Date Taking? Authorizing Provider  ALPRAZolam Duanne Moron) 0.5 MG tablet Take 0.5-1 mg by mouth 2 (two) times daily as needed for sleep or anxiety.    Yes Historical Provider, MD  CYMBALTA 30 MG capsule Take 90 mg by mouth every morning.  10/26/13  Yes Historical Provider, MD  feeding supplement, ENSURE ENLIVE, (ENSURE ENLIVE) LIQD Take 237 mLs by mouth daily.   Yes Historical Provider, MD  fluticasone (FLONASE ALLERGY RELIEF) 50 MCG/ACT nasal spray Place 1 spray into both nostrils daily. Patient taking differently: Place 1 spray into both nostrils daily as needed for allergies.  04/01/14  Yes Hilton Sinclair,  MD  gabapentin (NEURONTIN) 300 MG capsule Take 600-900 mg by mouth 3 (three) times daily. Take 600mg  capsule in the morning, 600 mg in the afternoon and 900 mg at bedtime 10/26/13  Yes Historical Provider, MD  hyoscyamine (LEVSIN, ANASPAZ) 0.125 MG tablet Take 0.125 mg by mouth every 4 (four) hours as needed for cramping.   Yes Historical Provider, MD  insulin lispro (HUMALOG) 100 UNIT/ML injection Inject 4 Units into the  skin. Once daily at the largest meal   Yes Historical Provider, MD  LEVEMIR FLEXTOUCH 100 UNIT/ML Pen Inject 18 Units into the skin every morning.  08/27/13  Yes Historical Provider, MD  levonorgestrel (MIRENA) 20 MCG/24HR IUD 1 each by Intrauterine route once.    Yes Historical Provider, MD  prednisoLONE acetate (PRED FORTE) 1 % ophthalmic suspension Place 2 drops into the left eye 2 (two) times daily.   Yes Historical Provider, MD  promethazine (PHENERGAN) 12.5 MG tablet Take 12.5 mg by mouth every 6 (six) hours as needed for nausea or vomiting.    Yes Historical Provider, MD  traMADol (ULTRAM) 50 MG tablet Take 100 mg by mouth 3 (three) times daily. Take 100mg  in the morning, 100mg  in the afternoon and 100 mg at bedtime 10/28/13  Yes Historical Provider, MD  cephALEXin (KEFLEX) 500 MG capsule Take 1 capsule (500 mg total) by mouth 3 (three) times daily. Patient not taking: Reported on 10/03/2015 05/31/15   Nickie Retort, MD  HYDROcodone-acetaminophen [facility]) 5-325 MG tablet Take 1 tablet by mouth every 6 (six) hours as needed for moderate pain. Patient not taking: Reported on 10/03/2015 05/31/15   Nickie Retort, MD   Physical Exam: Vitals:   10/03/15 0540 10/03/15 0614 10/03/15 0723 10/03/15 0829  BP: 123/79 123/79 108/69 139/77  Pulse: 89 94 93 (!) 101  Resp:  16 14 14   Temp:   98 F (36.7 C) 98 F (36.7 C)  TempSrc: Oral  Axillary Oral  SpO2:  100%  100%  Weight:   48.5 kg (107 lb)   Height:   5\' 1"  (1.549 m)      General exam: emaciated appearing patient, lying comfortably supine on bed in no obvious distress. She appears chronically ill.   Head, eyes and ENT: Nontraumatic and normocephalic. Pupils equally reacting to light and accommodation. Oral mucosa very dry.  Neck: Supple. No JVD, carotid bruit or thyromegaly.  Lymphatics: No lymphadenopathy.  Respiratory system: Clear to auscultation. No increased work of breathing.  Cardiovascular system: S1 and S2 heard,  tachycardic, No JVD, murmurs, gallops, clicks or pedal edema.  Gastrointestinal system: Abdomen is nondistended, soft, left flank tenderness. Normal bowel sounds heard. No organomegaly or masses appreciated.  Central nervous system: Alert and oriented. No focal neurological deficits.  Extremities: Symmetric 5 x 5 power. Peripheral pulses symmetrically felt.   Skin: No rashes or acute findings.  Musculoskeletal system: Negative exam.  Psychiatry: Pleasant and cooperative.  Labs on Admission:  Basic Metabolic Panel:  Recent Labs Lab 10/03/15 0250  NA 135  K 4.3  CL 102  CO2 23  GLUCOSE 507*  BUN 35*  CREATININE 1.64*  CALCIUM 8.3*   Liver Function Tests:  Recent Labs Lab 10/03/15 0250  AST 12*  ALT 14  ALKPHOS 101  BILITOT 0.6  PROT 6.0*  ALBUMIN 2.9*   No results for input(s): LIPASE, AMYLASE in the last 168 hours. No results for input(s): AMMONIA in the last 168 hours. CBC:  Recent Labs Lab 10/03/15 0250  WBC  16.9*  NEUTROABS 15.5*  HGB 11.3*  HCT 34.0*  MCV 79.6  PLT 203   Cardiac Enzymes: No results for input(s): CKTOTAL, CKMB, CKMBINDEX, TROPONINI in the last 168 hours.  BNP (last 3 results) No results for input(s): PROBNP in the last 8760 hours. CBG:  Recent Labs Lab 10/02/15 2351 10/03/15 0739 10/03/15 0825  GLUCAP 591* 422* 368*    Radiological Exams on Admission: Ct Renal Stone Study  Result Date: 10/03/2015 CLINICAL DATA:  30 year old female with left flank pain. EXAM: CT ABDOMEN AND PELVIS WITHOUT CONTRAST TECHNIQUE: Multidetector CT imaging of the abdomen and pelvis was performed following the standard protocol without IV contrast. COMPARISON:  Abdominal CT dated 04/01/2015 FINDINGS: Evaluation of this exam is limited in the absence of intravenous contrast. Lower chest: The visualized lung bases are clear. No intra-abdominal free air or free fluid. Hepatobiliary: The visualized portions of the liver is unremarkable. In the dome of the  liver is not imaged. No intrahepatic biliary ductal dilatation. The gallbladder is unremarkable. Pancreas: Grossly unremarkable on limited visualization. Minimal peripancreatic stranding may be present. Correlation with pancreatic enzymes recommended. Spleen: Normal in size without focal abnormality. Adrenals/Urinary Tract: The adrenal glands appear unremarkable. There is mild bilateral hydronephrosis, left greater right with diffuse edema of the kidneys. A 2.1 x 1.9 cm hypodense lesion in the inferior pole of the left kidney is similar to prior study and likely represents a cyst. Faint radiodense foci in the interpolar aspect of the left kidney may be artifactual or represent nonobstructing small stones. The visualized ureters and urinary bladder are grossly unremarkable. Stomach/Bowel: Constipation. No evidence of bowel obstruction or active inflammation. Normal appendix. Evaluation of the bowel is however limited in the absence of oral contrast. Vascular/Lymphatic: The abdominal aorta and IVC are grossly unremarkable on this noncontrast study. No portal venous gas identified. Multiple top-normal retroperitoneal lymph nodes. Reproductive: The uterus is retroverted. An intrauterine device is noted. There is a 2.5 x 3.3 cm hypodense lesion in the posterior pelvis as seen on the prior CT likely representing an ovarian follicle/cyst versus less likely a degenerating uterine fibroid. Ultrasound may provide better evaluation of the pelvic structures. Other:  Small fat containing umbilical hernia. Musculoskeletal: No acute or significant osseous findings. IMPRESSION: Mild bilateral hydronephrosis with edematous appearance of the kidneys concerning for pyelonephritis. Correlation with urinalysis recommended. No obstructing stone identified. Constipation.  No bowel obstruction.  Normal appendix. Probable ovarian cyst versus less likely degenerating uterine fibroid. Electronically Signed   By: Anner Crete M.D.   On:  10/03/2015 03:56   EKG: Independently reviewed  Assessment/Plan Principal Problem:   Pyelonephritis Active Problems:   DM type 1, not at goal [facility])   AKI (acute kidney injury) ([place])   Dehydration   Leukocytosis   Tobacco abuse   Medical non-compliance  1. Mild bilateral hydronephrosis with edematous kidneys concerning for pyelonephritis - I have requested blood cultures to be drawn. The patient is already use received a dose of ceftriaxone. Follow urine culture. Continue ceftriaxone daily. Adjust antibiotics as culture results are available. Supportive care ordered with IV fluids. Intensive diabetes control. If I cannot get her blood glucose controlled over the next couple of hours will start an IV insulin infusion. Consider having her urologist come see her when we get her medically stabilized.  Get Renal US tomorrow to reassess kidneys after starting appropriate pyelonephritis treatment.   2. Leukocytosis-secondary to acute infection pyelonephritis-follow daily CBC. Lactic acid within normal limits.  3. Acute kidney injury  secondary to severe dehydration-hydrating with IV fluids repeat BMP avoid nephrotoxic agents. 4. Severe dehydration secondary to above-supportive IV fluid hydration ordered. Patient has had 2 boluses of fluid in the ED. 5. Type 1 diabetes mellitus-poorly controlled with neurological complications-most recent A1c greater than 12%, check a new hemoglobin A1c, basal bolus insulin therapy started. An immediate dose of long ordered to be given now as she has not received insulin in the last 12 hours, give Lantus 12 units now, supplemental sliding scale coverage ordered. Adjust and titrate doses as needed for better glycemic control. However, as noted above if I cannot get her blood sugars better controlled in the next 2 hours will start an IV insulin infusion. 6. Tobacco-offered a nicotine patch.    DVT Prophylaxis: lovenox Code Status: full  Family Communication: fiance at  bedside  Disposition Plan: TBD   Time spent: 17 mins  Irwin Brakeman, MD [facility] Pager [phone]  If 7PM-7AM, please contact night-coverage [email or web address] Password [facility] 10/03/2015, 8:47 AM

## 2015-10-03 NOTE — ED Notes (Signed)
Attempted to call report. RN unavailable at this time. Will call back in 15 minutes.

## 2015-10-04 ENCOUNTER — Inpatient Hospital Stay (HOSPITAL_COMMUNITY): Payer: Medicaid Other

## 2015-10-04 LAB — HEMOGLOBIN A1C
Hgb A1c MFr Bld: 10.2 % — ABNORMAL HIGH (ref 4.8–5.6)
Mean Plasma Glucose: 246 mg/dL

## 2015-10-04 LAB — CBC
HCT: 30.5 % — ABNORMAL LOW (ref 36.0–46.0)
Hemoglobin: 10.2 g/dL — ABNORMAL LOW (ref 12.0–15.0)
MCH: 27 pg (ref 26.0–34.0)
MCHC: 33.4 g/dL (ref 30.0–36.0)
MCV: 80.7 fL (ref 78.0–100.0)
PLATELETS: 218 10*3/uL (ref 150–400)
RBC: 3.78 MIL/uL — ABNORMAL LOW (ref 3.87–5.11)
RDW: 13.3 % (ref 11.5–15.5)
WBC: 12.5 10*3/uL — AB (ref 4.0–10.5)

## 2015-10-04 LAB — COMPREHENSIVE METABOLIC PANEL
ALBUMIN: 2.3 g/dL — AB (ref 3.5–5.0)
ALK PHOS: 81 U/L (ref 38–126)
ALT: 11 U/L — ABNORMAL LOW (ref 14–54)
AST: 11 U/L — AB (ref 15–41)
Anion gap: 4 — ABNORMAL LOW (ref 5–15)
BILIRUBIN TOTAL: 0.3 mg/dL (ref 0.3–1.2)
BUN: 36 mg/dL — AB (ref 6–20)
CALCIUM: 8 mg/dL — AB (ref 8.9–10.3)
CO2: 25 mmol/L (ref 22–32)
CREATININE: 1.32 mg/dL — AB (ref 0.44–1.00)
Chloride: 109 mmol/L (ref 101–111)
GFR calc Af Amer: >60 mL/min (ref >60)
GFR, EST NON AFRICAN AMERICAN: 54 mL/min — AB (ref >60)
GLUCOSE: 258 mg/dL — AB (ref 65–99)
Potassium: 4.1 mmol/L (ref 3.5–5.1)
Sodium: 138 mmol/L (ref 135–145)
TOTAL PROTEIN: 5.3 g/dL — AB (ref 6.5–8.1)

## 2015-10-04 LAB — GLUCOSE, CAPILLARY
GLUCOSE-CAPILLARY: 52 mg/dL — AB (ref 65–99)
Glucose-Capillary: 252 mg/dL — ABNORMAL HIGH (ref 65–99)
Glucose-Capillary: 315 mg/dL — ABNORMAL HIGH (ref 65–99)
Glucose-Capillary: 84 mg/dL (ref 65–99)
Glucose-Capillary: 256 mg/dL — ABNORMAL HIGH (ref 65–99)

## 2015-10-04 MED ORDER — ALPRAZOLAM 0.25 MG PO TABS
0.2500 mg | ORAL_TABLET | Freq: Once | ORAL | Status: AC
  Administered 2015-10-04: 0.25 mg via ORAL
  Filled 2015-10-04: qty 1

## 2015-10-04 MED ORDER — INSULIN GLARGINE 100 UNIT/ML ~~LOC~~ SOLN
2.0000 [IU] | SUBCUTANEOUS | Status: AC
  Administered 2015-10-04: 2 [IU] via SUBCUTANEOUS
  Filled 2015-10-04: qty 0.02

## 2015-10-04 MED ORDER — INSULIN GLARGINE 100 UNIT/ML ~~LOC~~ SOLN
18.0000 [IU] | Freq: Every day | SUBCUTANEOUS | Status: DC
  Administered 2015-10-05: 18 [IU] via SUBCUTANEOUS
  Filled 2015-10-04: qty 0.18

## 2015-10-04 MED ORDER — SODIUM CHLORIDE 0.9 % IV SOLN: INTRAVENOUS | Status: AC

## 2015-10-04 NOTE — Progress Notes (Signed)
Patient notifies RN to come into room. Patient tells RN that she wants to leave the hospital tonight and wants to know how to go about this. RN informs patient that if she decides to leave it is considered against medical advice and insurance will not cover care provided during this stay. Patient informs nurse that she can follow up with PCP tomorrow and wants to know if there is anything in the chart that should keep her here. RN informs patient that her WBC is slightly elevated and she is still receiving IV antibiotics. Patient has already removed IV herself and is still adamant about leaving AMA. RN notified provider on call. Charge RN currently speaking with patient to see if there are any issues that can be resolved.

## 2015-10-04 NOTE — Progress Notes (Signed)
PROGRESS NOTE    Tara Bullock  CHE:[id number] DOB: [birth year] DOA: 10/03/2015 PCP: Leamon Arnt, MD    Brief Narrative:  30 y.o. female with poorly controlled type 1 DM (most recent A1c >12), anorexia, tobacco abuse, history of hydronephrosis left kidney requiring stent placement (removed several months ago), sepsis and UTI, DKA presented to the ED complaining of severe left flank pain, nausea and vomiting, malaise and dysuria.  Patient also has been severely dehydrated. Her family reports that she has not been able to keep down food and drink. She was evaluated in the emergency department and found to have a urinary tract infection. She was severely dehydrated and her blood sugar was nearly 600.  She had a CT of the abdomen and pelvis that revealed mild bilateral hydronephrosis with edematous appearance of the kidneys concerning for pyelonephritis. She was given IV fluid hydration and IV ceftriaxone and hospital admission was requested. She was not noted to be in DKA  Assessment & Plan:   Principal Problem:   Pyelonephritis Active Problems:   DM type 1, not at goal [facility])   Dehydration   Leukocytosis   Tobacco abuse   Medical non-compliance   AKI (acute kidney injury) ([place])    1. Mild bilateral hydronephrosis with edematous kidneys concerning for pyelonephritis - patient has been started on empiric Rocephin. Urine culture will be obtained today. Clinically improving. Patient does have a history of "kidney blockage" requiring stent placement in the past. Will order renal ultrasound for better visualization of patient's kidneys. Pending results may warrant formal urology consultation.   will follow 2. Leukocytosis-suspect secondary to presenting pyelonephritis. Lactic acid levels were noted to be normal. CBC reviewed. Leukocytosis has improved overnight to 12,000 this morning. Will repeat CBC in the morning 3. Acute kidney injury secondary to severe dehydration-hydrating with IV  fluids repeat BMP avoid nephrotoxic agents. Will repeat basic metabolic panel morning 4. Severe dehydration secondary to above-continue IV fluid hydration as tolerated. Clinically improved this morning 5. 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 Patient is continued on 16 units of Lantus this morning with 6 units of pre-meal coverage. Glucose noted to be in the 200 to 300s. He reports taking 18 units of Levemir at home. Will increase dose of Lantus to 18 units. Diabetic coordinator is following 6. Tobacco cessation-patient was offered nicotine patch at time of admission.   DVT prophylaxis: Lovenox subcutaneous Code Status: Full code Family Communication: Patient in room, family at bedside Disposition Plan: Uncertain at this time  Consultants:     Procedures:     Antimicrobials: Anti-infectives    Start     Dose/Rate Route Frequency Ordered Stop   10/04/15 1000  cefTRIAXone (ROCEPHIN) 2 g in dextrose 5 % 50 mL IVPB     2 g 100 mL/hr over 30 Minutes Intravenous Every 24 hours 10/03/15 0901     10/03/15 1000  cefTRIAXone (ROCEPHIN) 1 g in dextrose 5 % 50 mL IVPB     1 g 100 mL/hr over 30 Minutes Intravenous  Once 10/03/15 0901 10/03/15 1016   10/03/15 0530  cefTRIAXone (ROCEPHIN) 1 g in dextrose 5 % 50 mL IVPB     1 g 100 mL/hr over 30 Minutes Intravenous  Once 10/03/15 0523 10/03/15 0710       Subjective: Feeling better today  Objective: Vitals:   10/03/15 0829 10/03/15 2100 10/04/15 0444 10/04/15 1402  BP: 139/77 125/76 115/62 121/71  Pulse: (!) 101 89 86 85  Resp: 14 20 20 20   Temp: 98 F (36.7 C) 97.7 F (36.5 C) 98.8 F (37.1 C) 98 F (36.7 C)  TempSrc: Oral Oral Oral Oral  SpO2: 100% 99% 99% 100%  Weight:      Height:        Intake/Output Summary (Last 24 hours) at 10/04/15 1451 Last data filed at 10/04/15 1300  Gross per 24 hour  Intake          3356.67 ml  Output              1300 ml  Net          2056.67 ml   Filed Weights   10/03/15 0723  Weight: 48.5 kg (107 lb)    Examination:  General exam: Appears calm and comfortable  Respiratory system: Clear to auscultation. Respiratory effort normal. Cardiovascular system: S1 & S2 heard, RRR. No JVD, murmurs, rubs, gallops or clicks. No pedal edema. Gastrointestinal system: Abdomen is nondistended, soft and nontender. No organomegaly or masses felt. Normal bowel sounds heard. Central nervous system: Alert and oriented. No focal neurological deficits. Extremities: Symmetric 5 x 5 power. Skin: No rashes, lesions or ulcers Psychiatry: Judgement and insight appear normal. Mood & affect appropriate.   Data Reviewed: I have personally reviewed following labs and imaging studies  CBC:  Recent Labs Lab 10/03/15 0250 10/04/15 0541  WBC 16.9* 12.5*  NEUTROABS 15.5*  --   HGB 11.3* 10.2*  HCT 34.0* 30.5*  MCV 79.6 80.7  PLT 203 409   Basic Metabolic Panel:  Recent Labs Lab 10/03/15 0250 10/04/15 0541  NA 135 138  K 4.3 4.1  CL 102 109  CO2 23 25  GLUCOSE 507* 258*  BUN 35* 36*  CREATININE 1.64* 1.32*  CALCIUM 8.3* 8.0*   GFR: Estimated Creatinine Clearance: 47.5 mL/min (by C-G formula based on SCr of 1.32 mg/dL (H)). Liver Function Tests:  Recent Labs Lab 10/03/15 0250 10/04/15 0541  AST 12* 11*  ALT 14 11*  ALKPHOS 101 81  BILITOT 0.6 0.3  PROT 6.0* 5.3*  ALBUMIN 2.9* 2.3*   No results for input(s): LIPASE, AMYLASE in the last 168 hours. No results for input(s): AMMONIA in the last 168 hours. Coagulation Profile: No results for input(s): INR, PROTIME in the last 168 hours. Cardiac Enzymes: No results for input(s): CKTOTAL, CKMB, CKMBINDEX, TROPONINI in the last 168 hours. BNP (last 3 results) No results for input(s): PROBNP in the last 8760 hours. HbA1C:  Recent Labs  10/03/15 0250  HGBA1C 10.2*   CBG:  Recent Labs Lab 10/03/15 1217 10/03/15 1702 10/03/15 2115  10/04/15 0750 10/04/15 1135  GLUCAP 330* 122* 166* 252* 315*   Lipid Profile: No results for input(s): CHOL, HDL, LDLCALC, TRIG, CHOLHDL, LDLDIRECT in the last 72 hours. Thyroid Function Tests: No results for input(s): TSH, T4TOTAL, FREET4, T3FREE, THYROIDAB in the last 72 hours. Anemia Panel: No results for input(s): VITAMINB12, FOLATE, FERRITIN, TIBC, IRON, RETICCTPCT in the last 72 hours. Sepsis Labs:  Recent Labs Lab 10/03/15 0309  LATICACIDVEN 1.00    No results found for this or any previous visit (from the past 240 hour(s)).   Radiology Studies: Ct Renal Stone Study  Result Date: 10/03/2015 CLINICAL DATA:  30 year old female with left flank pain. EXAM: CT ABDOMEN AND PELVIS WITHOUT CONTRAST TECHNIQUE: Multidetector CT imaging of the abdomen and pelvis was performed following the standard protocol without IV contrast. COMPARISON:  Abdominal CT dated 04/01/2015 FINDINGS:  Evaluation of this exam is limited in the absence of intravenous contrast. Lower chest: The visualized lung bases are clear. No intra-abdominal free air or free fluid. Hepatobiliary: The visualized portions of the liver is unremarkable. In the dome of the liver is not imaged. No intrahepatic biliary ductal dilatation. The gallbladder is unremarkable. Pancreas: Grossly unremarkable on limited visualization. Minimal peripancreatic stranding may be present. Correlation with pancreatic enzymes recommended. Spleen: Normal in size without focal abnormality. Adrenals/Urinary Tract: The adrenal glands appear unremarkable. There is mild bilateral hydronephrosis, left greater right with diffuse edema of the kidneys. A 2.1 x 1.9 cm hypodense lesion in the inferior pole of the left kidney is similar to prior study and likely represents a cyst. Faint radiodense foci in the interpolar aspect of the left kidney may be artifactual or represent nonobstructing small stones. The visualized ureters and urinary bladder are grossly  unremarkable. Stomach/Bowel: Constipation. No evidence of bowel obstruction or active inflammation. Normal appendix. Evaluation of the bowel is however limited in the absence of oral contrast. Vascular/Lymphatic: The abdominal aorta and IVC are grossly unremarkable on this noncontrast study. No portal venous gas identified. Multiple top-normal retroperitoneal lymph nodes. Reproductive: The uterus is retroverted. An intrauterine device is noted. There is a 2.5 x 3.3 cm hypodense lesion in the posterior pelvis as seen on the prior CT likely representing an ovarian follicle/cyst versus less likely a degenerating uterine fibroid. Ultrasound may provide better evaluation of the pelvic structures. Other:  Small fat containing umbilical hernia. Musculoskeletal: No acute or significant osseous findings. IMPRESSION: Mild bilateral hydronephrosis with edematous appearance of the kidneys concerning for pyelonephritis. Correlation with urinalysis recommended. No obstructing stone identified. Constipation.  No bowel obstruction.  Normal appendix. Probable ovarian cyst versus less likely degenerating uterine fibroid. Electronically Signed   By: Anner Crete M.D.   On: 10/03/2015 03:56    Scheduled Meds: . cefTRIAXone (ROCEPHIN)  IV  2 g Intravenous Q24H  . DULoxetine  90 mg Oral q morning - 10a  . enoxaparin (LOVENOX) injection  40 mg Subcutaneous Q24H  . feeding supplement (GLUCERNA 1.2 CAL)  237 mL Oral BID BM  . gabapentin  600 mg Oral 2 times per day   And  . gabapentin  900 mg Oral QHS  . insulin aspart  0-9 Units Subcutaneous TID WC  . insulin aspart  6 Units Subcutaneous TID WC  . [START ON 10/05/2015] insulin glargine  18 Units Subcutaneous Daily  . prednisoLONE acetate  2 drop Left Eye BID   Continuous Infusions: . sodium chloride 100 mL/hr at 10/04/15 0523     LOS: 1 day   CHIU, Orpah Melter, MD [facility] Pager [phone]  If 7PM-7AM, please contact  night-coverage [email or web address] Password [email or web address] 10/04/2015, 2:51 PM

## 2015-10-04 NOTE — Progress Notes (Signed)
Hypoglycemic Event  CBG: 52  Treatment: 120 ml of orange juice  Symptoms: no symptoms  Follow-up CBG: Time: 1740 CBG Result:84  Possible Reasons for Event: change of amount of insulin  Comments/MD notified: none    Tara Bullock Tara Bullock

## 2015-10-04 NOTE — Progress Notes (Signed)
Shift event: Earlier, RN, called this NP because pt was wanting to leave AMA. NP spoke with pt on phone. She had no issue with service in hospital, she just stated, "I think everything has been done and I can see my regular doctor". NP explained that her kidney function was still elevated and that she needed continued IV antibiotics and IVF to help resolve her issue/infection. Risks of not solving her acute kidney failure and infection explained. NP informed pt that if she leaves it will be AMA. She thanked this NP for "the advice" and stated she would have to talk with her fiance. NP spoke to RN and asked RN to let NP know if pt decided to leave.

## 2015-10-05 LAB — CBC
HEMATOCRIT: 27.7 % — AB (ref 36.0–46.0)
Hemoglobin: 9 g/dL — ABNORMAL LOW (ref 12.0–15.0)
MCH: 26.4 pg (ref 26.0–34.0)
MCHC: 32.5 g/dL (ref 30.0–36.0)
MCV: 81.2 fL (ref 78.0–100.0)
PLATELETS: 203 10*3/uL (ref 150–400)
RBC: 3.41 MIL/uL — ABNORMAL LOW (ref 3.87–5.11)
RDW: 13.6 % (ref 11.5–15.5)
WBC: 9.6 10*3/uL (ref 4.0–10.5)

## 2015-10-05 LAB — BASIC METABOLIC PANEL
Anion gap: 3 — ABNORMAL LOW (ref 5–15)
BUN: 29 mg/dL — AB (ref 6–20)
CO2: 25 mmol/L (ref 22–32)
CREATININE: 0.95 mg/dL (ref 0.44–1.00)
Calcium: 8.3 mg/dL — ABNORMAL LOW (ref 8.9–10.3)
Chloride: 111 mmol/L (ref 101–111)
Glucose, Bld: 180 mg/dL — ABNORMAL HIGH (ref 65–99)
POTASSIUM: 3.7 mmol/L (ref 3.5–5.1)
SODIUM: 139 mmol/L (ref 135–145)

## 2015-10-05 LAB — URINE CULTURE: Culture: NO GROWTH

## 2015-10-05 LAB — GLUCOSE, CAPILLARY: GLUCOSE-CAPILLARY: 136 mg/dL — AB (ref 65–99)

## 2015-10-05 MED ORDER — CIPROFLOXACIN HCL 500 MG PO TABS
500.0000 mg | ORAL_TABLET | Freq: Two times a day (BID) | ORAL | Status: DC
  Administered 2015-10-05: 500 mg via ORAL
  Filled 2015-10-05: qty 1

## 2015-10-05 MED ORDER — CIPROFLOXACIN HCL 500 MG PO TABS: 500.0000 mg | ORAL_TABLET | Freq: Two times a day (BID) | ORAL | 0 refills | Status: DC

## 2015-10-05 NOTE — Progress Notes (Signed)
Pt discharged from the unit. Staff member ambulated with pt to discharge area. Pt discharge instructions were given to pt and reviewed with pt and family member. No questions or concerns at this time.  Yehudah Standing W Catelyn Friel, RN

## 2015-10-05 NOTE — Discharge Summary (Signed)
Discharge Summary  Tara Bullock:[id number] DOB: Jan 30, 1985  PCP: Leamon Arnt, MD  Admit date: 10/03/2015 Discharge date: 10/05/2015  Time spent: <10mins  Recommendations for Outpatient Follow-up:  1. F/u with PMD within a week  for hospital discharge follow up, repeat cbc/bmp at follow up 2. F/u with urology for mild left hydronephrosis  Discharge Diagnoses:  Active Hospital Problems   Diagnosis Date Noted  . Pyelonephritis 10/03/2015  . AKI (acute kidney injury) ([place]) 10/03/2015  . DM type 1, not at goal [facility]) 09/16/2011  . Dehydration 09/16/2011  . Medical non-compliance 09/16/2011  . Tobacco abuse 09/16/2011  . Leukocytosis 09/16/2011    Resolved Hospital Problems   Diagnosis Date Noted Date Resolved  No resolved problems to display.    Discharge Condition: stable  Diet recommendation: carb modified  Filed Weights   10/03/15 0723  Weight: 48.5 kg (107 lb)    History of present illness:  Chief Complaint: left flank pain  HPI: Tara Bullock is a 30 y.o. female with poorly controlled type 1 DM (most recent A1c >12), anorexia, tobacco abuse, history of hydronephrosis left kidney requiring stent placement (removed several months ago), sepsis and UTI, DKA presented to the ED complaining of severe left flank pain, nausea and vomiting, malaise and dysuria.  Patient also has been severely dehydrated. Her family reports that she has not been able to keep down food and drink. She was evaluated in the emergency department and found to have a urinary tract infection. She was severely dehydrated and her blood sugar was nearly 600.  She had a CT of the abdomen and pelvis that revealed mild bilateral hydronephrosis with edematous appearance of the kidneys concerning for pyelonephritis. She was given IV fluid hydration and IV ceftriaxone and hospital admission was requested. She was not noted to be in DKA.  Hospital Course:  Principal Problem:    Pyelonephritis Active Problems:   DM type 1, not at goal [facility])   Dehydration   Leukocytosis   Tobacco abuse   Medical non-compliance   AKI (acute kidney injury) ([place])   1. Mild bilateral hydronephrosis with edematous kidneys concerning for pyelonephritis - patient has been started on empiric Rocephin.  Patient does have a history of "kidney blockage" requiring stent placement on the left in the past. renal ultrasound with mild left hydronephrosis. Urine culture and blood culture no growth at time of discharge. Clinically improving. No fever, no pain, wbc and cr normalized, she is discharged on cipro, she is advised to follow up with urology on out patient basis.       Patient pulled out her iv access the night before discharge, she is given orla cipro instead of rocephin prior to discharge on 10/4 2. Leukocytosis-suspect secondary to presenting pyelonephritis. Lactic acid levels were noted to be normal. CBC reviewed. Leukocytosis resolved at discharge. 3. Acute kidney injury secondary to severe dehydration-cr normalized with hydrating and treating pyelonephritis  4. Severe dehydration secondary to above-continue IV fluid hydration as tolerated. Clinically improved this morning 5. 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 Diabetic coordinator input appreciated, she is discharged on home dose insulin and continue follow with pmd and endocrinologist 6. Tobacco cessation education provided   DVT prophylaxis while in the hospital: Lovenox subcutaneous Code Status: Full code Family Communication: Patient in room, mother at bedside Disposition Plan: home on 10/4  Consultants:   none  Procedures:   none  Antimicrobials: rocephin  in the hospital   Discharge Exam: BP (!) 166/84 (BP Location: Left Arm)   Pulse 87   Temp 98.2 F (36.8 C) (Oral)   Resp 20   Ht 5\' 1"  (1.549 m)   Wt 48.5 kg (107  lb)   SpO2 100%   BMI 20.22 kg/m   General: NAD, thin Cardiovascular: RRR Respiratory: CTABL  Discharge Instructions You were cared for by a hospitalist during your hospital stay. If you have any questions about your discharge medications or the care you received while you were in the hospital after you are discharged, you can call the unit and asked to speak with the hospitalist on call if the hospitalist that took care of you is not available. Once you are discharged, your primary care physician will handle any further medical issues. Please note that NO REFILLS for any discharge medications will be authorized once you are discharged, as it is imperative that you return to your primary care physician (or establish a relationship with a primary care physician if you do not have one) for your aftercare needs so that they can reassess your need for medications and monitor your lab values.  Discharge Instructions    Diet Carb Modified    Complete by:  As directed    Increase activity slowly    Complete by:  As directed        Medication List    STOP taking these medications   cephALEXin 500 MG capsule Commonly known as:  KEFLEX   HYDROcodone-acetaminophen 5-325 MG tablet Commonly known as:  NORCO     TAKE these medications   ALPRAZolam 0.5 MG tablet Commonly known as:  XANAX Take 0.5-1 mg by mouth 2 (two) times daily as needed for sleep or anxiety.   ciprofloxacin 500 MG tablet Commonly known as:  CIPRO Take 1 tablet (500 mg total) by mouth 2 (two) times daily.   CYMBALTA 30 MG capsule Generic drug:  DULoxetine Take 90 mg by mouth every morning.   feeding supplement (ENSURE ENLIVE) Liqd Take 237 mLs by mouth daily.   fluticasone 50 MCG/ACT nasal spray Commonly known as:  FLONASE ALLERGY RELIEF Place 1 spray into both nostrils daily. What changed:  when to take this  reasons to take this   gabapentin 300 MG capsule Commonly known as:  NEURONTIN Take 600-900 mg by  mouth 3 (three) times daily. Take 600mg  capsule in the morning, 600 mg in the afternoon and 900 mg at bedtime   hyoscyamine 0.125 MG tablet Commonly known as:  LEVSIN, ANASPAZ Take 0.125 mg by mouth every 4 (four) hours as needed for cramping.   insulin lispro 100 UNIT/ML injection Commonly known as:  HUMALOG Inject 4 Units into the skin. Once daily at the largest meal   LEVEMIR FLEXTOUCH 100 UNIT/ML Pen Generic drug:  Insulin Detemir Inject 18 Units into the skin every morning.   levonorgestrel 20 MCG/24HR IUD Commonly known as:  MIRENA 1 each by Intrauterine route once.   prednisoLONE acetate 1 % ophthalmic suspension Commonly known as:  PRED FORTE Place 2 drops into the left eye 2 (two) times daily.   promethazine 12.5 MG tablet Commonly known as:  PHENERGAN Take 12.5 mg by mouth every 6 (six) hours as needed for nausea or vomiting.   traMADol 50 MG tablet Commonly known as:  ULTRAM Take 100 mg by mouth 3 (three) times daily. Take 100mg  in the morning, 100mg  in the afternoon and 100 mg at bedtime  Allergies  Allergen Reactions  . Ivp Dye [Iodinated Diagnostic Agents] Anaphylaxis and Hives  . Adhesive [Tape] Hives and Rash   Follow-up Information    ANDY,CAMILLE L, MD .   Specialty:  Family Medicine Why:  hosptial discharge follow up, repeat cbc/bmp at follow up. pmd to follow up on final urine culture result. Contact information: [place] [address] [phone]        Nickie Retort, MD Follow up in 1 month(s).   Specialty:  Urology Why:  left hydronephrosis Contact information: [place] [address] [phone]            The results of significant diagnostics from this hospitalization (including imaging, microbiology, ancillary and laboratory) are listed below for reference.    Significant Diagnostic Studies: US Renal  Result Date: 10/04/2015 CLINICAL DATA:  Hydronephrosis. EXAM: RENAL /  URINARY TRACT ULTRASOUND COMPLETE COMPARISON:  CT 10/03/2015, 04/01/2015.  Ultrasound 04/18/2015. FINDINGS: Right Kidney: Length: 12.0 cm. Echogenicity within normal limits. No mass or hydronephrosis visualized. Left Kidney: Length: 12.6 cm. Echogenicity within normal limits. Mild left hydronephrosis again noted. Similar finding noted on prior CT. 2.1 cm simple cyst lower pole again noted. Bladder: Appears normal for degree of bladder distention. Bilateral ureteral jets noted. Incidental note is made of a 2.7 cm right ovarian simple cyst consistent with a follicular cyst. IMPRESSION: 1.  Mild left hydronephrosis again noted. 2. Simple cysts again noted lower pole left kidney. 3. Incidental note is made of a 2.7 cm right ovarian cyst consistent with a follicular cyst. Electronically Signed   By: Marcello Moores  Register   On: 10/04/2015 15:10   Ct Renal Stone Study  Result Date: 10/03/2015 CLINICAL DATA:  30 year old female with left flank pain. EXAM: CT ABDOMEN AND PELVIS WITHOUT CONTRAST TECHNIQUE: Multidetector CT imaging of the abdomen and pelvis was performed following the standard protocol without IV contrast. COMPARISON:  Abdominal CT dated 04/01/2015 FINDINGS: Evaluation of this exam is limited in the absence of intravenous contrast. Lower chest: The visualized lung bases are clear. No intra-abdominal free air or free fluid. Hepatobiliary: The visualized portions of the liver is unremarkable. In the dome of the liver is not imaged. No intrahepatic biliary ductal dilatation. The gallbladder is unremarkable. Pancreas: Grossly unremarkable on limited visualization. Minimal peripancreatic stranding may be present. Correlation with pancreatic enzymes recommended. Spleen: Normal in size without focal abnormality. Adrenals/Urinary Tract: The adrenal glands appear unremarkable. There is mild bilateral hydronephrosis, left greater right with diffuse edema of the kidneys. A 2.1 x 1.9 cm hypodense lesion in the inferior  pole of the left kidney is similar to prior study and likely represents a cyst. Faint radiodense foci in the interpolar aspect of the left kidney may be artifactual or represent nonobstructing small stones. The visualized ureters and urinary bladder are grossly unremarkable. Stomach/Bowel: Constipation. No evidence of bowel obstruction or active inflammation. Normal appendix. Evaluation of the bowel is however limited in the absence of oral contrast. Vascular/Lymphatic: The abdominal aorta and IVC are grossly unremarkable on this noncontrast study. No portal venous gas identified. Multiple top-normal retroperitoneal lymph nodes. Reproductive: The uterus is retroverted. An intrauterine device is noted. There is a 2.5 x 3.3 cm hypodense lesion in the posterior pelvis as seen on the prior CT likely representing an ovarian follicle/cyst versus less likely a degenerating uterine fibroid. Ultrasound may provide better evaluation of the pelvic structures. Other:  Small fat containing umbilical hernia. Musculoskeletal: No acute or significant  osseous findings. IMPRESSION: Mild bilateral hydronephrosis with edematous appearance of the kidneys concerning for pyelonephritis. Correlation with urinalysis recommended. No obstructing stone identified. Constipation.  No bowel obstruction.  Normal appendix. Probable ovarian cyst versus less likely degenerating uterine fibroid. Electronically Signed   By: Anner Crete M.D.   On: 10/03/2015 03:56    Microbiology: Recent Results (from the past 240 hour(s))  Culture, blood (Routine X 2) w Reflex to ID Panel     Status: None (Preliminary result)   Collection Time: 10/03/15  8:54 AM  Result Value Ref Range Status   Specimen Description BLOOD RIGHT ANTECUBITAL  Final   Special Requests BOTTLES DRAWN AEROBIC AND ANAEROBIC 10 CC  Final   Culture   Final    NO GROWTH 1 DAY Performed at [facility]    Report Status PENDING  Incomplete  Culture, blood (Routine X 2) w  Reflex to ID Panel     Status: None (Preliminary result)   Collection Time: 10/03/15  8:54 AM  Result Value Ref Range Status   Specimen Description BLOOD RIGHT ANTECUBITAL  Final   Special Requests BOTTLES DRAWN AEROBIC AND ANAEROBIC 10 CC  Final   Culture   Final    NO GROWTH 1 DAY Performed at [facility]    Report Status PENDING  Incomplete     Labs: Basic Metabolic Panel:  Recent Labs Lab 10/03/15 0250 10/04/15 0541 10/05/15 0608  NA 135 138 139  K 4.3 4.1 3.7  CL 102 109 111  CO2 23 25 25   GLUCOSE 507* 258* 180*  BUN 35* 36* 29*  CREATININE 1.64* 1.32* 0.95  CALCIUM 8.3* 8.0* 8.3*   Liver Function Tests:  Recent Labs Lab 10/03/15 0250 10/04/15 0541  AST 12* 11*  ALT 14 11*  ALKPHOS 101 81  BILITOT 0.6 0.3  PROT 6.0* 5.3*  ALBUMIN 2.9* 2.3*   No results for input(s): LIPASE, AMYLASE in the last 168 hours. No results for input(s): AMMONIA in the last 168 hours. CBC:  Recent Labs Lab 10/03/15 0250 10/04/15 0541 10/05/15 0608  WBC 16.9* 12.5* 9.6  NEUTROABS 15.5*  --   --   HGB 11.3* 10.2* 9.0*  HCT 34.0* 30.5* 27.7*  MCV 79.6 80.7 81.2  PLT 203 218 203   Cardiac Enzymes: No results for input(s): CKTOTAL, CKMB, CKMBINDEX, TROPONINI in the last 168 hours. BNP: BNP (last 3 results) No results for input(s): BNP in the last 8760 hours.  ProBNP (last 3 results) No results for input(s): PROBNP in the last 8760 hours.  CBG:  Recent Labs Lab 10/04/15 1135 10/04/15 1635 10/04/15 1740 10/04/15 2200 10/05/15 0739  GLUCAP 315* 52* 84 256* 136*       Signed:  Larnce Schnackenberg MD, PhD  [facility] 10/05/2015, 9:36 AM

## 2015-10-05 NOTE — Progress Notes (Addendum)
Shift event: Pt states she wants to talk to doctor after incident on night shift. Pt mother explained "A PA told my daughter she was in kidney failure, and this made my daughter upset and ready to go home. She (the pt) thinks that she can't get any more treatment here [facility]), and she wants to go see her own doctors. I told her she should stay, however, because of her medications and medicaid requirements." Pt agreed to these statements and requested a dr consult as soon as possible.   Marylene Land, RN, BSN 10/05/2015 8:29 AM

## 2015-10-05 NOTE — Progress Notes (Signed)
Discharge planning, no HH needs identified. [phone] 

## 2015-10-08 LAB — CULTURE, BLOOD (ROUTINE X 2)
CULTURE: NO GROWTH
Culture: NO GROWTH

## 2015-12-15 ENCOUNTER — Ambulatory Visit: Payer: Self-pay | Admitting: Ophthalmology

## 2015-12-15 ENCOUNTER — Encounter (HOSPITAL_COMMUNITY): Payer: Self-pay | Admitting: *Deleted

## 2015-12-15 NOTE — Progress Notes (Addendum)
Anesthesia Chart Review: SAME DAY WORK-UP.  Patient is a 30 year old female scheduled for left eye pars plana vitrectomy with possible endo laser on 12/16/15 by Dr. Jalene Mullet.   - PCP is Billey Chang, MD (notes in care everywhere) - Endocrinologist is Susette Racer, MD (notes in care everywhere)  PMH includes:  DM type 1, anorexia nervosa, GERD. Current smoker. BMI 22  Medications include: humalog, levemir  Labs will be obtained DOS. HgbA1c was 10.0 on 11/01/15. Per endocrinology notes, this is the best her diabetes has been controlled in years. 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   EKG 05/09/15: NSR. Rightward axis  Pt will need further assessment by assigned anesthesiologist DOS. If labs acceptable, I anticipate pt can proceed as scheduled.   Willeen Cass, FNP-BC [facility]/Anesthesiology Phone: [phone] 12/15/2015 3:10 PM

## 2015-12-15 NOTE — H&P (Signed)
Date of examination:  12/16/15   Indication for surgery: non-clearing vitreous hemorrhage and severe proliferative diabetic retinopathy  Pertinent past medical history:  Past Medical History:  Diagnosis Date  . Anorexia nervosa   . Anxiety   . Arthritis   . Depression   . Fibromyalgia   . GERD (gastroesophageal reflux disease)   . Headache   . History of hydronephrosis    left ureteral placement 04-02-2015  resolved  . IBS (irritable bowel syndrome)   . Insomnia   . Neuromuscular disorder ([place])   . Pneumonia   . Renal cyst, left   . Retained ureteral stent    left  . Rheumatoid factor positive   . Uncontrolled insulin dependent type 1 diabetes mellitus ([place]) followed by dr Veva Holes ([facility] [place])   last A1c  12.9 on 04-02-2015  . Wears glasses     Pertinent ocular history:  Prior tractional retinal detachment repair with placement of gas tamponade left eye  Pertinent family history:  Family History  Problem Relation Age of Onset  . Hypertension Mother   . Hypertension Father     General:  Healthy appearing patient in no distress.    Eyes:    Acuity  OS CF    External: Within normal limits    Anterior segment: Within normal limits     Motility:   nl  Fundus: No view OS     Impression: Non-clearing vitreous hemorrhage and severe proliferative diabetic retinopathy left eye   Plan: Pars plana vitrectomy with endolaser and possible gas tamponade left eye  Tara Bullock

## 2015-12-15 NOTE — H&P (Deleted)
  The note originally documented on this encounter has been moved the the encounter in which it belongs.  

## 2015-12-16 ENCOUNTER — Encounter (HOSPITAL_COMMUNITY): Payer: Self-pay | Admitting: Anesthesiology

## 2015-12-16 ENCOUNTER — Ambulatory Visit (HOSPITAL_COMMUNITY)
Admission: RE | Admit: 2015-12-16 | Discharge: 2015-12-16 | Disposition: A | Discharge status: Home / Self Care | Payer: Medicaid Other | Source: Ambulatory Visit | Attending: Ophthalmology | Admitting: Ophthalmology

## 2015-12-16 ENCOUNTER — Encounter (HOSPITAL_COMMUNITY)
Admission: RE | Disposition: A | Discharge status: Home / Self Care | Payer: Self-pay | Source: Ambulatory Visit | Attending: Ophthalmology

## 2015-12-16 ENCOUNTER — Ambulatory Visit (HOSPITAL_COMMUNITY): Payer: Medicaid Other | Admitting: Vascular Surgery

## 2015-12-16 DIAGNOSIS — Z794 Long term (current) use of insulin: Secondary | ICD-10-CM | POA: Diagnosis not present

## 2015-12-16 DIAGNOSIS — F17200 Nicotine dependence, unspecified, uncomplicated: Secondary | ICD-10-CM | POA: Insufficient documentation

## 2015-12-16 DIAGNOSIS — H4312 Vitreous hemorrhage, left eye: Secondary | ICD-10-CM | POA: Diagnosis present

## 2015-12-16 DIAGNOSIS — K219 Gastro-esophageal reflux disease without esophagitis: Secondary | ICD-10-CM | POA: Insufficient documentation

## 2015-12-16 DIAGNOSIS — E103592 Type 1 diabetes mellitus with proliferative diabetic retinopathy without macular edema, left eye: Secondary | ICD-10-CM | POA: Insufficient documentation

## 2015-12-16 DIAGNOSIS — E1065 Type 1 diabetes mellitus with hyperglycemia: Secondary | ICD-10-CM | POA: Diagnosis not present

## 2015-12-16 HISTORY — DX: Pneumonia, unspecified organism: J18.9

## 2015-12-16 HISTORY — DX: Headache: R51

## 2015-12-16 HISTORY — DX: Myoneural disorder, unspecified: G70.9

## 2015-12-16 HISTORY — DX: Headache, unspecified: R51.9

## 2015-12-16 HISTORY — DX: Insomnia, unspecified: G47.00

## 2015-12-16 HISTORY — PX: PARS PLANA VITRECTOMY: SHX2166

## 2015-12-16 LAB — HCG, SERUM, QUALITATIVE: Preg, Serum: NEGATIVE

## 2015-12-16 LAB — CBC
HCT: 32.5 % — ABNORMAL LOW (ref 36.0–46.0)
HEMOGLOBIN: 10.6 g/dL — AB (ref 12.0–15.0)
MCH: 26.4 pg (ref 26.0–34.0)
MCHC: 32.6 g/dL (ref 30.0–36.0)
MCV: 81 fL (ref 78.0–100.0)
Platelets: 255 10*3/uL (ref 150–400)
RBC: 4.01 MIL/uL (ref 3.87–5.11)
RDW: 12.5 % (ref 11.5–15.5)
WBC: 8.3 10*3/uL (ref 4.0–10.5)

## 2015-12-16 LAB — GLUCOSE, CAPILLARY
GLUCOSE-CAPILLARY: 140 mg/dL — AB (ref 65–99)
GLUCOSE-CAPILLARY: 35 mg/dL — AB (ref 65–99)
Glucose-Capillary: 111 mg/dL — ABNORMAL HIGH (ref 65–99)
Glucose-Capillary: 45 mg/dL — ABNORMAL LOW (ref 65–99)
Glucose-Capillary: 67 mg/dL (ref 65–99)

## 2015-12-16 LAB — BASIC METABOLIC PANEL
ANION GAP: 7 (ref 5–15)
BUN: 23 mg/dL — ABNORMAL HIGH (ref 6–20)
CALCIUM: 8.9 mg/dL (ref 8.9–10.3)
CHLORIDE: 106 mmol/L (ref 101–111)
CO2: 26 mmol/L (ref 22–32)
Creatinine, Ser: 1.34 mg/dL — ABNORMAL HIGH (ref 0.44–1.00)
GFR calc Af Amer: >60 mL/min (ref >60)
GFR calc non Af Amer: 53 mL/min — ABNORMAL LOW (ref >60)
GLUCOSE: 87 mg/dL (ref 65–99)
POTASSIUM: 3.6 mmol/L (ref 3.5–5.1)
Sodium: 139 mmol/L (ref 135–145)

## 2015-12-16 SURGERY — VITRECTOMY, PARS PLANA APPROACH, WITH IOL INSERTION OR REPLACEMENT
Anesthesia: Monitor Anesthesia Care | Site: Eye | Laterality: Left

## 2015-12-16 MED ORDER — PROMETHAZINE HCL 25 MG/ML IJ SOLN: 6.2500 mg | INTRAMUSCULAR | Status: DC | PRN

## 2015-12-16 MED ORDER — TOBRAMYCIN-DEXAMETHASONE 0.3-0.1 % OP OINT
TOPICAL_OINTMENT | OPHTHALMIC | Status: AC
  Filled 2015-12-16: qty 3.5

## 2015-12-16 MED ORDER — DEXTROSE 50 % IV SOLN
25.0000 g | INTRAVENOUS | Status: DC
  Filled 2015-12-16: qty 50

## 2015-12-16 MED ORDER — MEPERIDINE HCL 25 MG/ML IJ SOLN: 6.2500 mg | INTRAMUSCULAR | Status: DC | PRN

## 2015-12-16 MED ORDER — 0.9 % SODIUM CHLORIDE (POUR BTL) OPTIME
TOPICAL | Status: DC | PRN
  Administered 2015-12-16: 100 mL

## 2015-12-16 MED ORDER — DEXAMETHASONE SODIUM PHOSPHATE 10 MG/ML IJ SOLN
INTRAMUSCULAR | Status: AC
  Filled 2015-12-16: qty 1

## 2015-12-16 MED ORDER — LACTATED RINGERS IV SOLN: INTRAVENOUS | Status: DC

## 2015-12-16 MED ORDER — MIDAZOLAM HCL 5 MG/5ML IJ SOLN
INTRAMUSCULAR | Status: DC | PRN
  Administered 2015-12-16: 0.5 mg via INTRAVENOUS

## 2015-12-16 MED ORDER — TOBRAMYCIN-DEXAMETHASONE 0.3-0.1 % OP OINT
TOPICAL_OINTMENT | OPHTHALMIC | Status: DC | PRN
  Administered 2015-12-16: 1 via OPHTHALMIC

## 2015-12-16 MED ORDER — HYPROMELLOSE (GONIOSCOPIC) 2.5 % OP SOLN
OPHTHALMIC | Status: DC | PRN
  Administered 2015-12-16: 2 [drp] via OPHTHALMIC

## 2015-12-16 MED ORDER — PREDNISOLONE ACETATE 1 % OP SUSP
1.0000 [drp] | OPHTHALMIC | Status: AC
  Administered 2015-12-16: 1 [drp] via OPHTHALMIC
  Filled 2015-12-16: qty 5

## 2015-12-16 MED ORDER — MIDAZOLAM HCL 2 MG/2ML IJ SOLN
INTRAMUSCULAR | Status: AC
  Filled 2015-12-16: qty 2

## 2015-12-16 MED ORDER — CYCLOPENTOLATE HCL 1 % OP SOLN
1.0000 [drp] | OPHTHALMIC | Status: AC | PRN
  Administered 2015-12-16 (×3): 1 [drp] via OPHTHALMIC
  Filled 2015-12-16: qty 2

## 2015-12-16 MED ORDER — LACTATED RINGERS IV SOLN
INTRAVENOUS | Status: DC | PRN
  Administered 2015-12-16: 07:00:00 via INTRAVENOUS

## 2015-12-16 MED ORDER — PROPOFOL 10 MG/ML IV BOLUS
INTRAVENOUS | Status: DC | PRN
  Administered 2015-12-16: 30 mg via INTRAVENOUS

## 2015-12-16 MED ORDER — BSS IO SOLN
INTRAOCULAR | Status: DC | PRN
  Administered 2015-12-16: 15 mL

## 2015-12-16 MED ORDER — BSS PLUS IO SOLN
INTRAOCULAR | Status: AC
  Filled 2015-12-16: qty 500

## 2015-12-16 MED ORDER — FENTANYL CITRATE (PF) 100 MCG/2ML IJ SOLN: 25.0000 ug | INTRAMUSCULAR | Status: DC | PRN

## 2015-12-16 MED ORDER — ONDANSETRON HCL 4 MG/2ML IJ SOLN
INTRAMUSCULAR | Status: AC
  Filled 2015-12-16: qty 2

## 2015-12-16 MED ORDER — FENTANYL CITRATE (PF) 100 MCG/2ML IJ SOLN
INTRAMUSCULAR | Status: DC | PRN
  Administered 2015-12-16: 25 ug via INTRAVENOUS

## 2015-12-16 MED ORDER — PROPOFOL 10 MG/ML IV BOLUS
INTRAVENOUS | Status: AC
  Filled 2015-12-16: qty 40

## 2015-12-16 MED ORDER — OFLOXACIN 0.3 % OP SOLN
1.0000 [drp] | OPHTHALMIC | Status: AC | PRN
  Administered 2015-12-16 (×3): 1 [drp] via OPHTHALMIC
  Filled 2015-12-16: qty 5

## 2015-12-16 MED ORDER — FENTANYL CITRATE (PF) 100 MCG/2ML IJ SOLN
INTRAMUSCULAR | Status: AC
  Filled 2015-12-16: qty 2

## 2015-12-16 MED ORDER — DEXAMETHASONE SODIUM PHOSPHATE 10 MG/ML IJ SOLN
INTRAMUSCULAR | Status: DC | PRN
Start: 2015-12-16 — End: 2015-12-16
  Administered 2015-12-16: 10 mg

## 2015-12-16 MED ORDER — HYPROMELLOSE (GONIOSCOPIC) 2.5 % OP SOLN
OPHTHALMIC | Status: AC
  Filled 2015-12-16: qty 15

## 2015-12-16 MED ORDER — EPINEPHRINE PF 1 MG/ML IJ SOLN
INTRAMUSCULAR | Status: AC
  Filled 2015-12-16: qty 1

## 2015-12-16 MED ORDER — TETRACAINE HCL 0.5 % OP SOLN
2.0000 [drp] | OPHTHALMIC | Status: AC
  Administered 2015-12-16: 2 [drp] via OPHTHALMIC
  Filled 2015-12-16: qty 2

## 2015-12-16 MED ORDER — HYALURONIDASE HUMAN 150 UNIT/ML IJ SOLN
INTRAMUSCULAR | Status: AC
  Filled 2015-12-16: qty 1

## 2015-12-16 MED ORDER — LIDOCAINE HCL 2 % IJ SOLN
INTRAMUSCULAR | Status: DC | PRN
  Administered 2015-12-16: 5 mL via RETROBULBAR

## 2015-12-16 MED ORDER — LIDOCAINE HCL 2 % IJ SOLN
INTRAMUSCULAR | Status: AC
  Filled 2015-12-16: qty 20

## 2015-12-16 MED ORDER — BUPIVACAINE HCL (PF) 0.75 % IJ SOLN
INTRAMUSCULAR | Status: AC
  Filled 2015-12-16: qty 10

## 2015-12-16 MED ORDER — DEXTROSE 50 % IV SOLN
INTRAVENOUS | Status: DC | PRN
  Administered 2015-12-16: 12.5 mL via INTRAVENOUS
  Administered 2015-12-16 (×3): 25 mL via INTRAVENOUS
  Administered 2015-12-16: 12.5 mL via INTRAVENOUS

## 2015-12-16 MED ORDER — EPINEPHRINE PF 1 MG/ML IJ SOLN
INTRAMUSCULAR | Status: DC | PRN
  Administered 2015-12-16: 08:00:00

## 2015-12-16 MED ORDER — SODIUM CHLORIDE 0.9 % IV SOLN
INTRAVENOUS | Status: DC | PRN
  Administered 2015-12-16: 09:00:00 via INTRAVENOUS

## 2015-12-16 SURGICAL SUPPLY — 33 items
APPLICATOR COTTON TIP 6IN STRL (MISCELLANEOUS) ×3 IMPLANT
CANNULA VLV SOFT TIP 25GA (OPHTHALMIC) ×3 IMPLANT
CLOSURE STERI-STRIP 1/2X4 (GAUZE/BANDAGES/DRESSINGS) ×1
CLSR STERI-STRIP ANTIMIC 1/2X4 (GAUZE/BANDAGES/DRESSINGS) ×2 IMPLANT
COVER MAYO STAND STRL (DRAPES) ×3 IMPLANT
DRAPE INCISE 51X51 W/FILM STRL (DRAPES) ×3 IMPLANT
DRAPE PROXIMA HALF (DRAPES) ×3 IMPLANT
DRAPE RETRACTOR (MISCELLANEOUS) ×3 IMPLANT
ERASER HMR WETFIELD 23G BP (MISCELLANEOUS) IMPLANT
GLOVE BIOGEL PI IND STRL 7.5 (GLOVE) ×1 IMPLANT
GLOVE BIOGEL PI INDICATOR 7.5 (GLOVE) ×2
GOWN STRL REUS W/ TWL LRG LVL3 (GOWN DISPOSABLE) ×3 IMPLANT
GOWN STRL REUS W/TWL LRG LVL3 (GOWN DISPOSABLE) ×6
KIT BASIN OR (CUSTOM PROCEDURE TRAY) ×3 IMPLANT
KIT ROOM TURNOVER OR (KITS) ×3 IMPLANT
LENS BIOM SUPER VIEW SET DISP (OPHTHALMIC RELATED) ×3 IMPLANT
MICROPICK 25G (MISCELLANEOUS)
NEEDLE 18GX1X1/2 (RX/OR ONLY) (NEEDLE) ×3 IMPLANT
NEEDLE FILTER BLUNT 18X 1/2SAF (NEEDLE) ×2
NEEDLE FILTER BLUNT 18X1 1/2 (NEEDLE) ×1 IMPLANT
NEEDLE HYPO 25GX1X1/2 BEV (NEEDLE) ×3 IMPLANT
NEEDLE HYPO 30X.5 LL (NEEDLE) ×6 IMPLANT
NEEDLE RETROBULBAR 25GX1.5 (NEEDLE) ×3 IMPLANT
NS IRRIG 1000ML POUR BTL (IV SOLUTION) ×3 IMPLANT
PACK VITRECTOMY CUSTOM (CUSTOM PROCEDURE TRAY) ×3 IMPLANT
PAD ARMBOARD 7.5X6 YLW CONV (MISCELLANEOUS) ×6 IMPLANT
PAK PIK VITRECTOMY CVS 25GA (OPHTHALMIC) ×3 IMPLANT
PICK MICROPICK 25G (MISCELLANEOUS) IMPLANT
PROBE LASER ILLUM FLEX CVD 25G (OPHTHALMIC) ×3 IMPLANT
SYR TB 1ML LUER SLIP (SYRINGE) ×3 IMPLANT
TOWEL OR 17X24 6PK STRL BLUE (TOWEL DISPOSABLE) ×3 IMPLANT
WATER STERILE IRR 1000ML POUR (IV SOLUTION) ×3 IMPLANT
WIPE INSTRUMENT VISIWIPE 73X73 (MISCELLANEOUS) ×3 IMPLANT

## 2015-12-16 NOTE — Anesthesia Postprocedure Evaluation (Signed)
Anesthesia Post Note  Patient: Tara Bullock  Procedure(s) Performed: Procedure(s) (LRB): LEFT EYE PARS PLANA VITRECTOMY WITH ENDO LASER (Left)  Patient location during evaluation: PACU Anesthesia Type: MAC Level of consciousness: awake and alert Pain management: pain level controlled Vital Signs Assessment: post-procedure vital signs reviewed and stable Respiratory status: spontaneous breathing, nonlabored ventilation, respiratory function stable and patient connected to nasal cannula oxygen Cardiovascular status: stable and blood pressure returned to baseline Anesthetic complications: no    Last Vitals:  Vitals:   12/16/15 0930 12/16/15 0938  BP: (!) 147/97 (!) 145/89  Pulse: 94 91  Resp: 14 14  Temp:      Last Pain:  Vitals:   12/16/15 0938  TempSrc:   PainSc: 0-No pain                 Effie Berkshire

## 2015-12-16 NOTE — Brief Op Note (Signed)
12/16/2015  8:46 AM  PATIENT:  Tara Bullock  30 y.o. female  PRE-OPERATIVE DIAGNOSIS:  vitreous hemorrhage in the left eye  POST-OPERATIVE DIAGNOSIS:  vitreous hemorrhage in the left eye  PROCEDURE:  Procedure(s): LEFT EYE PARS PLANA VITRECTOMY WITH ENDO LASER (Left)  SURGEON:  Surgeon(s) and Role:    * Jalene Mullet, MD - Primary  PHYSICIAN ASSISTANT:   ASSISTANTS: none   ANESTHESIA:   local and MAC  EBL:  Total I/O In: 750 [I.V.:750] Out: -   BLOOD ADMINISTERED:none  DRAINS: none   LOCAL MEDICATIONS USED:  MARCAINE    and LIDOCAINE   SPECIMEN:  No Specimen  DISPOSITION OF SPECIMEN:  N/A  COUNTS:  YES  TOURNIQUET:  * No tourniquets in log *  DICTATION: .Note written in EPIC  PLAN OF CARE: Discharge to home after PACU  PATIENT DISPOSITION:  PACU - hemodynamically stable.   Delay start of Pharmacological VTE agent (>24hrs) due to surgical blood loss or risk of bleeding: no

## 2015-12-16 NOTE — Transfer of Care (Signed)
Immediate Anesthesia Transfer of Care Note  Patient: Tara Bullock  Procedure(s) Performed: Procedure(s): LEFT EYE PARS PLANA VITRECTOMY WITH ENDO LASER (Left)  Patient Location: PACU  Anesthesia Type:MAC  Level of Consciousness: awake, alert , oriented and patient cooperative  Airway & Oxygen Therapy: Patient Spontanous Breathing and Patient connected to nasal cannula oxygen  Post-op Assessment: Report given to RN, Post -op Vital signs reviewed and stable and Patient moving all extremities  Post vital signs: Reviewed and stable  Last Vitals:  Vitals:   12/16/15 0647  BP: (!) 143/101  Pulse: 94  Resp: 18  Temp: 36.7 C    Last Pain:  Vitals:   12/16/15 0647  TempSrc: Oral      Patients Stated Pain Goal: 3 (42/39/53 2023)  Complications: No apparent anesthesia complications

## 2015-12-16 NOTE — Anesthesia Preprocedure Evaluation (Addendum)
Anesthesia Evaluation  Patient identified by MRN, date of birth, ID band Patient awake    Reviewed: Allergy & Precautions, NPO status , Patient's Chart, lab work & pertinent test results  Airway Mallampati: II  TM Distance: >3 FB Neck ROM: Full    Dental  (+) Teeth Intact, Dental Advisory Given   Pulmonary Current Smoker,    breath sounds clear to auscultation       Cardiovascular negative cardio ROS   Rhythm:Regular Rate:Normal     Neuro/Psych  Headaches, PSYCHIATRIC DISORDERS Anxiety Depression  Neuromuscular disease    GI/Hepatic Neg liver ROS, GERD  Controlled and Medicated,  Endo/Other  diabetes, Poorly Controlled, Type 1, Insulin Dependent  Renal/GU Renal InsufficiencyRenal disease  negative genitourinary   Musculoskeletal  (+) Arthritis , Osteoarthritis,  Fibromyalgia -  Abdominal   Peds negative pediatric ROS (+)  Hematology negative hematology ROS (+)   Anesthesia Other Findings   Reproductive/Obstetrics                           Anesthesia Physical Anesthesia Plan  ASA: II  Anesthesia Plan: MAC   Post-op Pain Management:    Induction: Intravenous  Airway Management Planned: Natural Airway  Additional Equipment:   Intra-op Plan:   Post-operative Plan:   Informed Consent: I have reviewed the patients History and Physical, chart, labs and discussed the procedure including the risks, benefits and alternatives for the proposed anesthesia with the patient or authorized representative who has indicated his/her understanding and acceptance.     Plan Discussed with: CRNA  Anesthesia Plan Comments: (Pt took normal insulin this AM. Her blood sugar is currently in the 40's. Glucagon administered blood sugar in 60"s. Normal mentation throughout.  )      Anesthesia Quick Evaluation

## 2015-12-16 NOTE — Discharge Instructions (Addendum)
Do not sleep on back. Sleep on side until air bubble gone

## 2015-12-17 ENCOUNTER — Encounter (HOSPITAL_COMMUNITY): Payer: Self-pay | Admitting: Ophthalmology

## 2015-12-17 LAB — HEMOGLOBIN A1C
Hgb A1c MFr Bld: 11.9 % — ABNORMAL HIGH (ref 4.8–5.6)
Mean Plasma Glucose: 295 mg/dL

## 2015-12-24 NOTE — H&P (Signed)
Date of examination:  12/16/2015  Indication for surgery: Vitreous hemorrhage left eye with proliferative diabetic retinopathy  Pertinent past medical history:  Past Medical History:  Diagnosis Date  . Anorexia nervosa   . Anxiety   . Arthritis   . Depression   . Fibromyalgia   . GERD (gastroesophageal reflux disease)   . Headache   . History of hydronephrosis    left ureteral placement 04-02-2015  resolved  . IBS (irritable bowel syndrome)   . Insomnia   . Neuromuscular disorder ([place])   . Pneumonia   . Renal cyst, left   . Retained ureteral stent    left  . Rheumatoid factor positive   . Uncontrolled insulin dependent type 1 diabetes mellitus ([place]) followed by dr Veva Holes ([facility] in [place])   last A1c  12.9 on 04-02-2015  . Wears glasses     Pertinent ocular history:  Vitreous hemorrhage left eye with proliferative diabetic retinopathy  Pertinent family history:  Family History  Problem Relation Age of Onset  . Hypertension Mother   . Hypertension Father     General:  Healthy appearing patient in no distress.     Eyes:    Acuity  OS HM Gay  External: Within normal limits      Anterior segment: Within normal limits      Motility:      Fundus: Normal          Impression: Vitreous hemorrhage left eye with proliferative diabetic retinopathy  Plan: Pars plana vitrectomy with endolaser left eye  Royston Cowper

## 2015-12-24 NOTE — Op Note (Signed)
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  A lid speculum was placed.  Infusion line and trocar was placed at the 4 o'clock position approximately 3.5 mm from the surgical limbus.   The infusion line was allowed to run and then clamped when placed at the cannula opening. The line was inserted and secured to the drape with an adhesive strip.   Active trocars/cannula were placed at the 10 and 2 o'clock positions approximately 3.5 mm from the surgical limbus. The cannula was visualized in the vitreous cavity.  The light pipe and vitreous cutter were inserted into the vitreous cavity and a careful peripheral vitrectomy was performed to remove any remaining peripheral vitreous after removal and clearing of dispersed heme.  Care taken to remove the vitreous up to the vitreous base for 360 degrees.     Fill-in endolaser were applied 360 degrees to the periphery.  A partial air-fluid exchange was performed.  The superior cannulas were sequentially removed with concommitant tamponade using a cotton tipped applicator and noted to be air tight.  The infusion line and trocar were removed and the sclerotomy was noted to be air tight with normal intraocular pressure by digital palpapation.  Subconjunctival injections of  Dexamethasone 4mg /20ml was placed in the infero-medial quadrant.       The speculum and drapes were removed and the eye was patched with Polymixin/Bacitracin ophthalmic ointment. An eye shield was placed and the patient was transferred alert and conversant with stable vital signs to the post operative recovery area.  The patient tolerated the  procedure well and no complications were noted.  Royston Cowper MD

## 2016-04-09 ENCOUNTER — Ambulatory Visit (INDEPENDENT_AMBULATORY_CARE_PROVIDER_SITE_OTHER): Payer: Medicaid Other | Admitting: Podiatry

## 2016-04-09 DIAGNOSIS — M79676 Pain in unspecified toe(s): Secondary | ICD-10-CM

## 2016-04-09 DIAGNOSIS — L03039 Cellulitis of unspecified toe: Secondary | ICD-10-CM | POA: Diagnosis not present

## 2016-04-09 DIAGNOSIS — L600 Ingrowing nail: Secondary | ICD-10-CM | POA: Diagnosis not present

## 2016-04-09 MED ORDER — DOXYCYCLINE HYCLATE 100 MG PO TABS: 100.0000 mg | ORAL_TABLET | Freq: Two times a day (BID) | ORAL | 0 refills | Status: DC

## 2016-04-09 NOTE — Patient Instructions (Signed)

## 2016-04-10 NOTE — Progress Notes (Signed)
   Subjective: Patient is a 31 year old female with PMHx of T1DM presenting today for evaluation of pain in bilateral first and second toes for the past 6 months. Patient is concerned for possible ingrown nails and states the second toes are worse than the great toes. Patient presents today for further treatment and evaluation.  Objective:  General: Well developed, nourished, in no acute distress, alert and oriented x3   Dermatology: Skin is warm, dry and supple bilateral. Medial and lateral borders of bilateral 2nd toes appear to be erythematous with evidence of an ingrowing nail. Pain on palpation noted to the border of the nail fold. The remaining nails appear unremarkable at this time. There are no open sores, lesions.  Vascular: Dorsalis Pedis artery and Posterior Tibial artery pedal pulses palpable. No lower extremity edema noted.   Neruologic: Grossly intact via light touch bilateral.  Musculoskeletal: Muscular strength within normal limits in all groups bilateral. Normal range of motion noted to all pedal and ankle joints.   Assesement: #1 Paronychia with ingrowing nail of medial and lateral borders of 2nd nails bilaterally #2 Pain in second toes bilaterally #3 Incurvated nails to bilateral 2nd toes  Plan of Care:  1. Patient evaluated.  2. Discussed treatment alternatives and plan of care. Explained nail avulsion procedure and post procedure course to patient. 3. Patient opted for permanent partial nail avulsion.  4. Prior to procedure, local anesthesia infiltration utilized using 3 ml of a 50:50 mixture of 2% plain lidocaine and 0.5% plain marcaine in a normal digital block fashion and a betadine prep performed.  5. Partial permanent nail avulsion with chemical matrixectomy performed using [id number] applications of phenol followed by alcohol flush.  6. Light dressing applied. 7. Prescription for Doxycycline 8. Return to clinic in 2 weeks.   Edrick Kins, DPM [facility]  Dr. Edrick Kins, [address]                                        [place], [place] [address]                Office [phone]  Fax [phone]

## 2016-04-25 ENCOUNTER — Ambulatory Visit (INDEPENDENT_AMBULATORY_CARE_PROVIDER_SITE_OTHER): Payer: Medicaid Other | Admitting: Podiatry

## 2016-04-25 ENCOUNTER — Encounter: Payer: Self-pay | Admitting: Podiatry

## 2016-04-25 DIAGNOSIS — M79676 Pain in unspecified toe(s): Secondary | ICD-10-CM

## 2016-04-25 DIAGNOSIS — S91109D Unspecified open wound of unspecified toe(s) without damage to nail, subsequent encounter: Secondary | ICD-10-CM | POA: Diagnosis not present

## 2016-04-25 DIAGNOSIS — S91209D Unspecified open wound of unspecified toe(s) with damage to nail, subsequent encounter: Secondary | ICD-10-CM

## 2016-04-26 ENCOUNTER — Other Ambulatory Visit: Payer: Self-pay

## 2016-04-26 ENCOUNTER — Other Ambulatory Visit: Payer: Self-pay | Admitting: Family Medicine

## 2016-04-26 DIAGNOSIS — N6019 Diffuse cystic mastopathy of unspecified breast: Secondary | ICD-10-CM

## 2016-04-26 DIAGNOSIS — N6310 Unspecified lump in the right breast, unspecified quadrant: Secondary | ICD-10-CM

## 2016-04-27 NOTE — Progress Notes (Signed)
   Subjective: Patient presents today 2 weeks post ingrown nail permanent nail avulsion procedure. Patient states that the toe and nail fold is still sensitive and she has only been able to wear flip flops. She reports finishing her prescription for doxycycline although it made her feel nauseous.  Objective: Skin is warm, dry and supple. Nail and respective nail fold appears to be healing appropriately. Open wound to the associated nail fold with a granular wound base and moderate amount of fibrotic tissue. Minimal drainage noted. Mild erythema around the periungual region likely due to phenol chemical matricectomy.  Assessment: #1 postop permanent partial nail avulsion to the medial and lateral borders of second nails bilaterally #2 open wound periungual nail fold of respective digit.   Plan of care: #1 patient was evaluated  #2 debridement of open wound was performed to the periungual border of the respective toe using a currette. Antibiotic ointment and Band-Aid was applied. #3 patient is to return to clinic on a PRN basis/when she is ready to treat ingrown's on bilateral great toes.   Edrick Kins, DPM [facility]  Dr. Edrick Kins, [address]                                        [place], [place] [address]                Office [phone]  Fax [phone]

## 2016-05-20 ENCOUNTER — Encounter (HOSPITAL_COMMUNITY): Payer: Self-pay | Admitting: Emergency Medicine

## 2016-05-20 ENCOUNTER — Emergency Department (HOSPITAL_COMMUNITY)
Admission: EM | Admit: 2016-05-20 | Discharge: 2016-05-20 | Disposition: A | Discharge status: Home / Self Care | Payer: Medicaid Other | Attending: Emergency Medicine | Admitting: Emergency Medicine

## 2016-05-20 DIAGNOSIS — E1065 Type 1 diabetes mellitus with hyperglycemia: Secondary | ICD-10-CM | POA: Diagnosis not present

## 2016-05-20 DIAGNOSIS — F17210 Nicotine dependence, cigarettes, uncomplicated: Secondary | ICD-10-CM | POA: Insufficient documentation

## 2016-05-20 DIAGNOSIS — Z794 Long term (current) use of insulin: Secondary | ICD-10-CM | POA: Diagnosis not present

## 2016-05-20 DIAGNOSIS — R739 Hyperglycemia, unspecified: Secondary | ICD-10-CM

## 2016-05-20 LAB — URINALYSIS, ROUTINE W REFLEX MICROSCOPIC
BACTERIA UA: NONE SEEN
BILIRUBIN URINE: NEGATIVE
Ketones, ur: NEGATIVE mg/dL
NITRITE: NEGATIVE
Protein, ur: 100 mg/dL — AB
Specific Gravity, Urine: 1.01 (ref 1.005–1.030)
pH: 6 (ref 5.0–8.0)

## 2016-05-20 LAB — CBC
HEMATOCRIT: 31.5 % — AB (ref 36.0–46.0)
Hemoglobin: 10.4 g/dL — ABNORMAL LOW (ref 12.0–15.0)
MCH: 27 pg (ref 26.0–34.0)
MCHC: 33 g/dL (ref 30.0–36.0)
MCV: 81.8 fL (ref 78.0–100.0)
Platelets: 304 10*3/uL (ref 150–400)
RBC: 3.85 MIL/uL — AB (ref 3.87–5.11)
RDW: 13.1 % (ref 11.5–15.5)
WBC: 8.7 10*3/uL (ref 4.0–10.5)

## 2016-05-20 LAB — I-STAT BETA HCG BLOOD, ED (MC, WL, AP ONLY)

## 2016-05-20 LAB — DIFFERENTIAL
BASOS ABS: 0 10*3/uL (ref 0.0–0.1)
BASOS PCT: 1 %
EOS ABS: 0.2 10*3/uL (ref 0.0–0.7)
EOS PCT: 3 %
LYMPHS ABS: 2.4 10*3/uL (ref 0.7–4.0)
Lymphocytes Relative: 29 %
Monocytes Absolute: 0.5 10*3/uL (ref 0.1–1.0)
Monocytes Relative: 6 %
NEUTROS PCT: 61 %
Neutro Abs: 5.2 10*3/uL (ref 1.7–7.7)

## 2016-05-20 LAB — BASIC METABOLIC PANEL
Anion gap: 7 (ref 5–15)
BUN: 23 mg/dL — AB (ref 6–20)
CALCIUM: 8 mg/dL — AB (ref 8.9–10.3)
CHLORIDE: 107 mmol/L (ref 101–111)
CO2: 25 mmol/L (ref 22–32)
CREATININE: 1.45 mg/dL — AB (ref 0.44–1.00)
GFR calc non Af Amer: 48 mL/min — ABNORMAL LOW (ref >60)
GFR, EST AFRICAN AMERICAN: 55 mL/min — AB (ref >60)
GLUCOSE: 249 mg/dL — AB (ref 65–99)
Potassium: 4.4 mmol/L (ref 3.5–5.1)
Sodium: 139 mmol/L (ref 135–145)

## 2016-05-20 LAB — CBG MONITORING, ED: Glucose-Capillary: 251 mg/dL — ABNORMAL HIGH (ref 65–99)

## 2016-05-20 MED ORDER — SODIUM CHLORIDE 0.9 % IV BOLUS (SEPSIS)
1000.0000 mL | Freq: Once | INTRAVENOUS | Status: AC
  Administered 2016-05-20: 1000 mL via INTRAVENOUS

## 2016-05-20 NOTE — Discharge Instructions (Signed)
Please continue to eat and take your insulin. There is no evidence of DKA today. Please follow-up with your PCP for re-evaluation this week.  REturn for worsening symptoms, including fever, confusion, intractable vomiting or any other symptoms concerning to you.

## 2016-05-20 NOTE — ED Triage Notes (Signed)
Brought in by EMS from home with c/o hyperglycemia.  Pt reported that she has been feeling weak and her blood sugar was high when she checked it.  Hx of DM with medication non-compliance and eating disorder.   Pt's CBG was 319 by EMS.  Was given NS 500 ml bolus on scene, and 2nd NS 500 ml bolus infusing on arrival to ED.

## 2016-05-20 NOTE — ED Notes (Signed)
Bed: WA10 Expected date:  Expected time:  Means of arrival:  Comments: EMS 

## 2016-05-20 NOTE — ED Provider Notes (Signed)
[facility] DEPT Provider Note   CSN: [id number] Arrival date & time: 05/20/16  1905     History   Chief Complaint Chief Complaint  Patient presents with  . Hyperglycemia    HPI EVALYNE CORTOPASSI is a 31 y.o. female.  The history is provided by the patient.  Hyperglycemia  Blood sugar level PTA:  319 Severity:  Moderate Onset quality:  Gradual Duration:  3 days Timing:  Intermittent Progression:  Waxing and waning Chronicity:  Recurrent Diabetes status:  Controlled with insulin Context: noncompliance and recent change in diet   Context: not change in medication and not recent illness   Relieved by:  Insulin and diet Associated symptoms: dehydration, fatigue, increased thirst and malaise   Associated symptoms: no abdominal pain, no altered mental status, no chest pain, no confusion, no dysuria, no fever, no increased appetite, no nausea, no polyuria, no shortness of breath, no syncope, no vomiting and no weakness    31 year old female who presents with hyperglycemia. She has a history of poorly controlled type 1 diabetes. She states that she has a eating disorder, and to control her weight she skips her insulin and meals to lose weight. Has been working with a therapist and psychiatrist, and overall going the right direction. However, over the weekend felt that she could have been going to DKA as she was giving her insulin and meals. Initially did have nausea and vomiting Thursday through Friday, this began to eat and give herself insulin over the weekend and feels mildly improved. Continues to have fatigue and generalized malaise. No recent fevers, chills, chest pain, difficulty breathing, abdominal pain, cough, dysuria or urinary frequency. EMS was called today, and initial CBG was 319. Was given 1 L of IV fluids in route.   Past Medical History:  Diagnosis Date  . Anorexia nervosa   . Anxiety   . Arthritis   . Depression   . Fibromyalgia   . GERD (gastroesophageal  reflux disease)   . Headache   . History of hydronephrosis    left ureteral placement 04-02-2015  resolved  . IBS (irritable bowel syndrome)   . Insomnia   . Neuromuscular disorder ([place])   . Pneumonia   . Renal cyst, left   . Retained ureteral stent    left  . Rheumatoid factor positive   . Uncontrolled insulin dependent type 1 diabetes mellitus ([place]) followed by dr Veva Holes ([facility] [place])   last A1c  12.9 on 04-02-2015  . Wears glasses     Patient Active Problem List   Diagnosis Date Noted  . Pyelonephritis 10/03/2015  . AKI (acute kidney injury) ([place]) 10/03/2015  . Hyperglycemia 04/02/2015  . Diabetic hyperosmolar non-ketotic state ([place]) 04/02/2015  . Hydronephrosis, left 04/02/2015  . Allergic rhinitis 04/01/2014  . Anorexia nervosa 11/06/2013  . DM type 1, not at goal [facility]) 09/16/2011  . Dehydration 09/16/2011  . Hyponatremia ([place]) due to DKA 09/16/2011  . Leukocytosis 09/16/2011  . Bronchitis, acute 09/16/2011  . Influenza-like illness 09/16/2011  . Tobacco abuse 09/16/2011  . Medical non-compliance 09/16/2011    Past Surgical History:  Procedure Laterality Date  . CYSTOSCOPY W/ URETERAL STENT REMOVAL Left 05/31/2015   Procedure: CYSTOSCOPY WITH LEFT URETERAL  STENT REMOVAL,AND  RETROGRADE PYELOGRAM;  Surgeon: Nickie Retort, MD;  Location: [facility];  Service: Urology;  Laterality: Left;  . CYSTOSCOPY WITH STENT PLACEMENT Left 04/02/2015   Procedure: CYSTOSCOPY, RETROGRADE PYELOGRAM WITH LEFT STENT PLACEMENT;  Surgeon: Nickie Retort, MD;  Location: [facility];  Service: Urology;  Laterality: Left;  . LEEP  2009  . LEEP    . PARS PLANA VITRECTOMY Left 12/16/2015   Procedure: LEFT EYE PARS PLANA VITRECTOMY WITH ENDO LASER;  Surgeon: Jalene Mullet, MD;  Location: [facility];  Service: Ophthalmology;  Laterality: Left;    OB History    No data available       Home Medications    Prior to Admission medications   Medication Sig Start Date  End Date Taking? Authorizing Provider  ALPRAZolam Duanne Moron) 0.5 MG tablet Take 0.5-1 mg by mouth 2 (two) times daily as needed for sleep or anxiety.     [provider]  ciprofloxacin (CIPRO) 500 MG tablet Take 1 tablet (500 mg total) by mouth 2 (two) times daily. Patient not taking: Reported on 12/15/2015 10/05/15   Florencia Reasons, MD  CYMBALTA 30 MG capsule Take 90 mg by mouth every morning.  10/26/13   [provider]  feeding supplement, GLUCERNA SHAKE, (GLUCERNA SHAKE) LIQD Take 237 mLs by mouth daily.    [provider]  fluticasone (FLONASE ALLERGY RELIEF) 50 MCG/ACT nasal spray Place 1 spray into both nostrils daily. Patient taking differently: Place 1 spray into both nostrils daily as needed for allergies.  04/01/14   Hilton Sinclair, MD  gabapentin (NEURONTIN) 300 MG capsule Take 600-900 mg by mouth 3 (three) times daily. Take 600mg  capsule in the morning, 600 mg in the afternoon and 900 mg at bedtime 10/26/13   [provider]  hyoscyamine (LEVSIN, ANASPAZ) 0.125 MG tablet Take 0.125 mg by mouth every 4 (four) hours as needed for cramping.    [provider]  insulin lispro (HUMALOG) 100 UNIT/ML injection Inject 4 Units into the skin. Once daily at the [employer]    [provider]  LEVEMIR FLEXTOUCH 100 UNIT/ML Pen Inject 18 Units into the skin every morning.  08/27/13   [provider]  levonorgestrel (MIRENA) 20 MCG/24HR IUD 1 each by Intrauterine route once.     [provider]  promethazine (PHENERGAN) 12.5 MG tablet Take 12.5 mg by mouth every 6 (six) hours as needed for nausea or vomiting.     [provider]  traMADol (ULTRAM) 50 MG tablet Take 100 mg by mouth 3 (three) times daily. Take 100mg  in the morning, 100mg  in the afternoon and 100 mg at bedtime 10/28/13   [provider]    Family History Family History  Problem Relation Age of Onset  . Hypertension Mother   . Hypertension Father       Social History Social History  Substance Use Topics  . Smoking status: Current Every Day Smoker    Packs/day: 1.00    Years: 15.00    Types: Cigarettes  . Smokeless tobacco: Never Used  . Alcohol use No     Allergies   Ivp dye [iodinated diagnostic agents] and Adhesive [tape]   Review of Systems Review of Systems  Constitutional: Positive for fatigue. Negative for fever.  HENT: Negative for congestion.   Respiratory: Negative for shortness of breath.   Cardiovascular: Negative for chest pain and syncope.  Gastrointestinal: Negative for abdominal pain, nausea and vomiting.  Endocrine: Positive for polydipsia. Negative for polyuria.  Genitourinary: Negative for dysuria.  Neurological: Negative for weakness.  Psychiatric/Behavioral: Negative for confusion.  All other systems reviewed and are negative.    Physical Exam Updated Vital Signs BP (!) 185/102 (BP Location: Right Arm)   Pulse 85  Temp 97.8 F (36.6 C) (Oral)   Resp 18   Ht 5\' 1"  (1.549 m)   Wt 105 lb (47.6 kg)   SpO2 100%   BMI 19.84 kg/m   Physical Exam Physical Exam  Nursing note and vitals reviewed. Constitutional:  non-toxic, and in no acute distress Head: Normocephalic and atraumatic.  Mouth/Throat: Oropharynx is clear and mucous membranes dry.  Neck: Normal range of motion. Neck supple.  Cardiovascular: Normal rate and regular rhythm.   no edema Pulmonary/Chest: Effort normal and breath sounds normal.  Abdominal: Soft. There is no tenderness. There is no rebound and no guarding.  Musculoskeletal: Normal range of motion.  Neurological: Alert, no facial droop, fluent speech, moves all extremities symmetrically Skin: Skin is warm and dry.  Psychiatric: Cooperative   ED Treatments / Results  Labs (all labs ordered are listed, but only abnormal results are displayed) Labs Reviewed  BASIC METABOLIC PANEL - Abnormal; Notable for the following:       Result Value   Glucose, Bld 249 (*)     BUN 23 (*)    Creatinine, Ser 1.45 (*)    Calcium 8.0 (*)    GFR calc non Af Amer 48 (*)    GFR calc Af Amer 55 (*)    All other components within normal limits  CBC - Abnormal; Notable for the following:    RBC 3.85 (*)    Hemoglobin 10.4 (*)    HCT 31.5 (*)    All other components within normal limits  URINALYSIS, ROUTINE W REFLEX MICROSCOPIC - Abnormal; Notable for the following:    APPearance HAZY (*)    Glucose, UA >=500 (*)    Hgb urine dipstick SMALL (*)    Protein, ur 100 (*)    Leukocytes, UA MODERATE (*)    Squamous Epithelial / LPF 6-30 (*)    All other components within normal limits  CBG MONITORING, ED - Abnormal; Notable for the following:    Glucose-Capillary 251 (*)    All other components within normal limits  DIFFERENTIAL  CBG MONITORING, ED  I-STAT BETA HCG BLOOD, ED ([facility], [facility], [facility] ONLY)    EKG  EKG Interpretation None       Radiology No results found.  Procedures Procedures (including critical care time)  Medications Ordered in ED Medications  sodium chloride 0.9 % bolus 1,000 mL (1,000 mLs Intravenous New Bag/Given 05/20/16 2001)     Initial Impression / Assessment and Plan / ED Course  I have reviewed the triage vital signs and the nursing notes.  Pertinent labs & imaging results that were available during my care of the patient were reviewed by me and considered in my medical decision making (see chart for details).     History of poorly controlled diabetes who presents with hyperglycemia. After 1 L of fluid EMS, her point-of-care glucose in the ED is 250. She appears dry on exam. Blood work without evidence of DKA. UA with some leukocytes and a PVCs, but she is not having any UTI symptoms. Thus will not treat. Given IV fluids, and symptomatically feels improved. She is felt stable for discharge home. Discussed importance of eating and being compliant with her insulin. Strict return and follow-up instructions reviewed. She expressed  understanding of all discharge instructions and felt comfortable with the plan of care.   Final Clinical Impressions(s) / ED Diagnoses   Final diagnoses:  Hyperglycemia    New Prescriptions New Prescriptions   No medications on file  Forde Dandy, MD 05/20/16 2159

## 2016-05-20 NOTE — ED Notes (Signed)
Pt stated she had been throwing up this weekend and feeling "woozy."

## 2016-09-07 ENCOUNTER — Other Ambulatory Visit: Payer: Medicaid Other

## 2016-10-22 ENCOUNTER — Ambulatory Visit: Payer: Self-pay | Admitting: Ophthalmology

## 2016-10-22 ENCOUNTER — Encounter (HOSPITAL_COMMUNITY): Payer: Self-pay | Admitting: *Deleted

## 2016-10-22 NOTE — H&P (Deleted)
  The note originally documented on this encounter has been moved the the encounter in which it belongs.  

## 2016-10-22 NOTE — Progress Notes (Signed)
Patient denies chest pain or shortness of breath. Patient provided with the below instructions regarding DM management per our Medication Adjustment Guidelines. Patient verbalized understanding.  How do I manage my blood sugar before surgery? . Check your blood sugar at least 4 times a day, starting 2 days before surgery, to make sure that the level is not too high or low. o Check your blood sugar the morning of your surgery when you wake up and every 2 hours until you get to the Short Stay unit. . If your blood sugar is less than 70 mg/dL, you will need to treat for low blood sugar: o Do not take insulin. o Treat a low blood sugar (less than 70 mg/dL) with  cup of clear juice (cranberry or apple), 4 glucose tablets, OR glucose gel. o Recheck blood sugar in 15 minutes after treatment (to make sure it is greater than 70 mg/dL). If your blood sugar is not greater than 70 mg/dL on recheck, call [phone] for further instructions. . Report your blood sugar to the short stay nurse when you get to Short Stay.  . If you are admitted to the hospital after surgery: o Your blood sugar will be checked by the staff and you will probably be given insulin after surgery (instead of oral diabetes medicines) to make sure you have good blood sugar levels. o The goal for blood sugar control after surgery is 80-180 mg/dL.     Marland Kitchen HE MORNING OF SURGERY, take _____14________ units of ___Levemir_______insulin.  . The day of surgery, do not take other diabetes injectables, including Byetta (exenatide), Bydureon (exenatide ER), Victoza (liraglutide), or Trulicity (dulaglutide).  . If your CBG is greater than 220 mg/dL, you may take  of your sliding scale (correction) dose of insulin.

## 2016-10-22 NOTE — H&P (Signed)
  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 Anorexia nervosa   . Anxiety   . Arthritis   . Depression   . Fibromyalgia   . GERD (gastroesophageal reflux disease)   . Headache   . History of hydronephrosis    left ureteral placement 04-02-2015  resolved  . Hypertension   . IBS (irritable bowel syndrome)   . Insomnia   . Neuromuscular disorder ([place])   . Pneumonia   . Renal cyst, left   . Retained ureteral stent    left  . Rheumatoid factor positive   . Uncontrolled insulin dependent type 1 diabetes mellitus ([place]) followed by dr Veva Holes ([facility] in [place])   last A1c  12.9 on 04-02-2015  . Wears glasses     Pertinent ocular history:  Severe proliferative diabetic retinopathy  Pertinent family history:  Family History  Problem Relation Age of Onset  . Hypertension Mother   . Hypertension Father     General:  Healthy appearing patient in no distress.    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

## 2016-10-23 ENCOUNTER — Ambulatory Visit (HOSPITAL_COMMUNITY)
Admission: RE | Admit: 2016-10-23 | Discharge: 2016-10-23 | Disposition: A | Discharge status: Home / Self Care | Payer: Medicaid Other | Source: Ambulatory Visit | Attending: Ophthalmology | Admitting: Ophthalmology

## 2016-10-23 ENCOUNTER — Inpatient Hospital Stay (HOSPITAL_COMMUNITY): Payer: Medicaid Other | Admitting: Certified Registered"

## 2016-10-23 ENCOUNTER — Encounter (HOSPITAL_COMMUNITY): Payer: Self-pay | Admitting: General Practice

## 2016-10-23 ENCOUNTER — Encounter (HOSPITAL_COMMUNITY)
Admission: RE | Disposition: A | Discharge status: Home / Self Care | Payer: Self-pay | Source: Ambulatory Visit | Attending: Ophthalmology

## 2016-10-23 DIAGNOSIS — F329 Major depressive disorder, single episode, unspecified: Secondary | ICD-10-CM | POA: Diagnosis not present

## 2016-10-23 DIAGNOSIS — K219 Gastro-esophageal reflux disease without esophagitis: Secondary | ICD-10-CM | POA: Diagnosis not present

## 2016-10-23 DIAGNOSIS — E103531 Type 1 diabetes mellitus with proliferative diabetic retinopathy with traction retinal detachment not involving the macula, right eye: Secondary | ICD-10-CM | POA: Diagnosis not present

## 2016-10-23 DIAGNOSIS — Z794 Long term (current) use of insulin: Secondary | ICD-10-CM | POA: Diagnosis not present

## 2016-10-23 DIAGNOSIS — H4311 Vitreous hemorrhage, right eye: Secondary | ICD-10-CM | POA: Diagnosis not present

## 2016-10-23 DIAGNOSIS — Z79899 Other long term (current) drug therapy: Secondary | ICD-10-CM | POA: Diagnosis not present

## 2016-10-23 DIAGNOSIS — M797 Fibromyalgia: Secondary | ICD-10-CM | POA: Insufficient documentation

## 2016-10-23 DIAGNOSIS — K589 Irritable bowel syndrome without diarrhea: Secondary | ICD-10-CM | POA: Diagnosis not present

## 2016-10-23 DIAGNOSIS — H3341 Traction detachment of retina, right eye: Secondary | ICD-10-CM | POA: Insufficient documentation

## 2016-10-23 DIAGNOSIS — I10 Essential (primary) hypertension: Secondary | ICD-10-CM | POA: Diagnosis not present

## 2016-10-23 DIAGNOSIS — F419 Anxiety disorder, unspecified: Secondary | ICD-10-CM | POA: Insufficient documentation

## 2016-10-23 DIAGNOSIS — Z87891 Personal history of nicotine dependence: Secondary | ICD-10-CM | POA: Insufficient documentation

## 2016-10-23 DIAGNOSIS — G4700 Insomnia, unspecified: Secondary | ICD-10-CM | POA: Insufficient documentation

## 2016-10-23 HISTORY — DX: Essential (primary) hypertension: I10

## 2016-10-23 HISTORY — PX: REPAIR OF COMPLEX TRACTION RETINAL DETACHMENT: SHX6217

## 2016-10-23 LAB — HCG, SERUM, QUALITATIVE: PREG SERUM: NEGATIVE

## 2016-10-23 LAB — CBC
HCT: 28.8 % — ABNORMAL LOW (ref 36.0–46.0)
HEMOGLOBIN: 9.4 g/dL — AB (ref 12.0–15.0)
MCH: 26 pg (ref 26.0–34.0)
MCHC: 32.6 g/dL (ref 30.0–36.0)
MCV: 79.8 fL (ref 78.0–100.0)
PLATELETS: 324 10*3/uL (ref 150–400)
RBC: 3.61 MIL/uL — ABNORMAL LOW (ref 3.87–5.11)
RDW: 13.1 % (ref 11.5–15.5)
WBC: 8.9 10*3/uL (ref 4.0–10.5)

## 2016-10-23 LAB — BASIC METABOLIC PANEL
ANION GAP: 8 (ref 5–15)
BUN: 25 mg/dL — ABNORMAL HIGH (ref 6–20)
CALCIUM: 8.7 mg/dL — AB (ref 8.9–10.3)
CO2: 23 mmol/L (ref 22–32)
CREATININE: 1.77 mg/dL — AB (ref 0.44–1.00)
Chloride: 105 mmol/L (ref 101–111)
GFR, EST AFRICAN AMERICAN: 43 mL/min — AB (ref >60)
GFR, EST NON AFRICAN AMERICAN: 37 mL/min — AB (ref >60)
GLUCOSE: 200 mg/dL — AB (ref 65–99)
Potassium: 4 mmol/L (ref 3.5–5.1)
Sodium: 136 mmol/L (ref 135–145)

## 2016-10-23 LAB — GLUCOSE, CAPILLARY
GLUCOSE-CAPILLARY: 115 mg/dL — AB (ref 65–99)
Glucose-Capillary: 210 mg/dL — ABNORMAL HIGH (ref 65–99)

## 2016-10-23 SURGERY — REPAIR, RETINAL DETACHMENT, COMPLEX
Anesthesia: Monitor Anesthesia Care | Site: Eye | Laterality: Right

## 2016-10-23 MED ORDER — SODIUM HYALURONATE 10 MG/ML IO SOLN
INTRAOCULAR | Status: AC
  Filled 2016-10-23: qty 0.85

## 2016-10-23 MED ORDER — ONDANSETRON HCL 4 MG/2ML IJ SOLN
INTRAMUSCULAR | Status: DC | PRN
  Administered 2016-10-23: 4 mg via INTRAVENOUS

## 2016-10-23 MED ORDER — DEXTROSE 50 % IV SOLN
INTRAVENOUS | Status: DC | PRN
  Administered 2016-10-23: 12.5 g via INTRAVENOUS

## 2016-10-23 MED ORDER — HYALURONIDASE HUMAN 150 UNIT/ML IJ SOLN
INTRAMUSCULAR | Status: AC
  Filled 2016-10-23: qty 1

## 2016-10-23 MED ORDER — BUPIVACAINE HCL (PF) 0.75 % IJ SOLN
INTRAMUSCULAR | Status: AC
  Filled 2016-10-23: qty 10

## 2016-10-23 MED ORDER — MIDAZOLAM HCL 5 MG/5ML IJ SOLN
INTRAMUSCULAR | Status: DC | PRN
  Administered 2016-10-23: 1 mg via INTRAVENOUS

## 2016-10-23 MED ORDER — SODIUM CHLORIDE 0.9 % IV SOLN
INTRAVENOUS | Status: DC
Start: 2016-10-23 — End: 2016-10-23
  Administered 2016-10-23: 14:00:00 via INTRAVENOUS

## 2016-10-23 MED ORDER — BSS IO SOLN
INTRAOCULAR | Status: AC
  Filled 2016-10-23: qty 15

## 2016-10-23 MED ORDER — PHENYLEPHRINE HCL 2.5 % OP SOLN
1.0000 [drp] | OPHTHALMIC | Status: AC | PRN
  Administered 2016-10-23 (×3): 1 [drp] via OPHTHALMIC
  Filled 2016-10-23: qty 2

## 2016-10-23 MED ORDER — HYPROMELLOSE (GONIOSCOPIC) 2.5 % OP SOLN
OPHTHALMIC | Status: DC | PRN
  Administered 2016-10-23: 2 [drp] via OPHTHALMIC

## 2016-10-23 MED ORDER — LIDOCAINE HCL 2 % IJ SOLN
INTRAMUSCULAR | Status: DC | PRN
  Administered 2016-10-23: 5 mL via OPHTHALMIC

## 2016-10-23 MED ORDER — BUPIVACAINE-EPINEPHRINE (PF) 0.25% -1:200000 IJ SOLN
INTRAMUSCULAR | Status: AC
  Filled 2016-10-23: qty 30

## 2016-10-23 MED ORDER — TROPICAMIDE 1 % OP SOLN
1.0000 [drp] | OPHTHALMIC | Status: AC | PRN
  Administered 2016-10-23 (×3): 1 [drp] via OPHTHALMIC
  Filled 2016-10-23: qty 15

## 2016-10-23 MED ORDER — HYPROMELLOSE (GONIOSCOPIC) 2.5 % OP SOLN
OPHTHALMIC | Status: AC
  Filled 2016-10-23: qty 15

## 2016-10-23 MED ORDER — POLYMYXIN B SULFATE 500000 UNITS IJ SOLR
INTRAMUSCULAR | Status: AC
  Filled 2016-10-23: qty 500000

## 2016-10-23 MED ORDER — EPINEPHRINE PF 1 MG/ML IJ SOLN
INTRAMUSCULAR | Status: AC
  Filled 2016-10-23: qty 1

## 2016-10-23 MED ORDER — CEFTAZIDIME 1 G IJ SOLR
INTRAMUSCULAR | Status: AC
  Filled 2016-10-23: qty 1

## 2016-10-23 MED ORDER — LIDOCAINE HCL 2 % IJ SOLN
INTRAMUSCULAR | Status: AC
  Filled 2016-10-23: qty 20

## 2016-10-23 MED ORDER — BSS IO SOLN
INTRAOCULAR | Status: DC | PRN
Start: 2016-10-23 — End: 2016-10-23
  Administered 2016-10-23: 15 mL via INTRAOCULAR

## 2016-10-23 MED ORDER — SODIUM CHLORIDE 0.9 % IJ SOLN
INTRAMUSCULAR | Status: AC
  Filled 2016-10-23: qty 10

## 2016-10-23 MED ORDER — PROPOFOL 10 MG/ML IV BOLUS
INTRAVENOUS | Status: DC | PRN
  Administered 2016-10-23: 20 mg via INTRAVENOUS
  Administered 2016-10-23: 50 mg via INTRAVENOUS

## 2016-10-23 MED ORDER — FENTANYL CITRATE (PF) 250 MCG/5ML IJ SOLN
INTRAMUSCULAR | Status: AC
  Filled 2016-10-23: qty 5

## 2016-10-23 MED ORDER — BSS IO SOLN
INTRAOCULAR | Status: AC
  Filled 2016-10-23: qty 500

## 2016-10-23 MED ORDER — STERILE WATER FOR INJECTION IJ SOLN
INTRAMUSCULAR | Status: AC
  Filled 2016-10-23: qty 20

## 2016-10-23 MED ORDER — ACETAZOLAMIDE SODIUM 500 MG IJ SOLR
INTRAMUSCULAR | Status: AC
  Filled 2016-10-23: qty 500

## 2016-10-23 MED ORDER — MIDAZOLAM HCL 2 MG/2ML IJ SOLN
INTRAMUSCULAR | Status: AC
  Filled 2016-10-23: qty 2

## 2016-10-23 MED ORDER — PROMETHAZINE HCL 25 MG/ML IJ SOLN: 6.2500 mg | INTRAMUSCULAR | Status: DC | PRN

## 2016-10-23 MED ORDER — PROPOFOL 500 MG/50ML IV EMUL
INTRAVENOUS | Status: DC | PRN
  Administered 2016-10-23: 50 ug/kg/min via INTRAVENOUS

## 2016-10-23 MED ORDER — DEXAMETHASONE SODIUM PHOSPHATE 10 MG/ML IJ SOLN
INTRAMUSCULAR | Status: AC
  Filled 2016-10-23: qty 1

## 2016-10-23 MED ORDER — CYCLOPENTOLATE HCL 1 % OP SOLN: 1.0000 [drp] | OPHTHALMIC | Status: DC | PRN

## 2016-10-23 MED ORDER — PROPARACAINE HCL 0.5 % OP SOLN
1.0000 [drp] | OPHTHALMIC | Status: AC | PRN
  Administered 2016-10-23 (×3): 1 [drp] via OPHTHALMIC
  Filled 2016-10-23: qty 15

## 2016-10-23 MED ORDER — ATROPINE SULFATE 1 % OP SOLN
OPHTHALMIC | Status: AC
  Filled 2016-10-23: qty 5

## 2016-10-23 MED ORDER — CEFAZOLIN SUBCONJUNCTIVAL INJECTION 100 MG/0.5 ML
100.0000 mg | INJECTION | SUBCONJUNCTIVAL | Status: DC
  Filled 2016-10-23: qty 5

## 2016-10-23 MED ORDER — BSS PLUS IO SOLN
INTRAOCULAR | Status: AC
  Filled 2016-10-23: qty 500

## 2016-10-23 MED ORDER — STERILE WATER FOR INJECTION IJ SOLN
INTRAMUSCULAR | Status: DC | PRN
  Administered 2016-10-23: 1000 mL

## 2016-10-23 MED ORDER — BSS PLUS IO SOLN
INTRAOCULAR | Status: DC | PRN
  Administered 2016-10-23: 1 via INTRAOCULAR

## 2016-10-23 MED ORDER — CYCLOPENTOLATE HCL 1 % OP SOLN
OPHTHALMIC | Status: AC
  Administered 2016-10-23: 1 [drp] via OPHTHALMIC
  Filled 2016-10-23: qty 2

## 2016-10-23 MED ORDER — DEXTROSE 50 % IV SOLN
INTRAVENOUS | Status: AC
  Filled 2016-10-23: qty 50

## 2016-10-23 MED ORDER — BACITRACIN-POLYMYXIN B 500-10000 UNIT/GM OP OINT
TOPICAL_OINTMENT | OPHTHALMIC | Status: AC
  Filled 2016-10-23: qty 3.5

## 2016-10-23 MED ORDER — TRIAMCINOLONE ACETONIDE 40 MG/ML IJ SUSP
INTRAMUSCULAR | Status: AC
  Filled 2016-10-23: qty 5

## 2016-10-23 MED ORDER — CYCLOPENTOLATE HCL 1 % OP SOLN
1.0000 [drp] | OPHTHALMIC | Status: AC | PRN
  Administered 2016-10-23 (×3): 1 [drp] via OPHTHALMIC

## 2016-10-23 MED ORDER — CEFAZOLIN SODIUM 1 G IJ SOLR
INTRAMUSCULAR | Status: DC | PRN
  Administered 2016-10-23: 100 mg

## 2016-10-23 MED ORDER — OFLOXACIN 0.3 % OP SOLN
1.0000 [drp] | OPHTHALMIC | Status: AC | PRN
  Administered 2016-10-23 (×3): 1 [drp] via OPHTHALMIC
  Filled 2016-10-23: qty 5

## 2016-10-23 MED ORDER — HYDROMORPHONE HCL 1 MG/ML IJ SOLN: 0.2500 mg | INTRAMUSCULAR | Status: DC | PRN

## 2016-10-23 SURGICAL SUPPLY — 48 items
APPLICATOR COTTON TIP 6IN STRL (MISCELLANEOUS) ×3 IMPLANT
APPLICATOR DR MATTHEWS STRL (MISCELLANEOUS) ×3 IMPLANT
BLADE MINI 60D BLUE (BLADE) ×3 IMPLANT
BLADE MINI RND TIP GREEN BEAV (BLADE) IMPLANT
CANNULA ANT CHAM MAIN (OPHTHALMIC RELATED) IMPLANT
CANNULA DUALBORE 25G (CANNULA) ×3 IMPLANT
CAUTERY EYE LOW TEMP 1300F FIN (OPHTHALMIC RELATED) IMPLANT
CORDS BIPOLAR (ELECTRODE) IMPLANT
COVER SURGICAL LIGHT HANDLE (MISCELLANEOUS) ×3 IMPLANT
FILTER BLUE MILLIPORE (MISCELLANEOUS) IMPLANT
FILTER STRAW FLUID ASPIR (MISCELLANEOUS) IMPLANT
FORCEPS GRIESHABER ILM 25G A (INSTRUMENTS) ×3 IMPLANT
FORCEPS HORIZONTAL 25G DISP (OPHTHALMIC RELATED) ×3 IMPLANT
GAS AUTO FILL CONSTEL (OPHTHALMIC) ×3
GAS AUTO FILL CONSTELLATION (OPHTHALMIC) ×1 IMPLANT
GAS OPHTHALMIC (MISCELLANEOUS) IMPLANT
GLOVE ECLIPSE 7.5 STRL STRAW (GLOVE) ×3 IMPLANT
GOWN STRL REUS W/ TWL LRG LVL3 (GOWN DISPOSABLE) ×2 IMPLANT
GOWN STRL REUS W/TWL LRG LVL3 (GOWN DISPOSABLE) ×4
KIT BASIN OR (CUSTOM PROCEDURE TRAY) ×3 IMPLANT
KIT PERFLUORON PROCEDURE 5ML (MISCELLANEOUS) IMPLANT
KIT ROOM TURNOVER OR (KITS) ×3 IMPLANT
NEEDLE 18GX1X1/2 (RX/OR ONLY) (NEEDLE) ×3 IMPLANT
NEEDLE HYPO 25GX1X1/2 BEV (NEEDLE) IMPLANT
NEEDLE HYPO 30X.5 LL (NEEDLE) ×6 IMPLANT
NS IRRIG 1000ML POUR BTL (IV SOLUTION) ×3 IMPLANT
PACK VITRECTOMY CUSTOM (CUSTOM PROCEDURE TRAY) ×3 IMPLANT
PAD ARMBOARD 7.5X6 YLW CONV (MISCELLANEOUS) ×6 IMPLANT
PAK VITRECTOMY PIK 25 GA (OPHTHALMIC RELATED) IMPLANT
PROBE ENDO DIATHERMY 25G (MISCELLANEOUS) ×3 IMPLANT
PROBE LASER ILLUM FLEX CVD 25G (OPHTHALMIC) ×3 IMPLANT
REPL STRA BRUSH NEEDLE (NEEDLE) IMPLANT
RESERVOIR BACK FLUSH (MISCELLANEOUS) IMPLANT
RETRACTOR IRIS FLEX 25G GRIESH (INSTRUMENTS) IMPLANT
ROLLS DENTAL (MISCELLANEOUS) IMPLANT
SET FLUID INJECTOR (SET/KITS/TRAYS/PACK) IMPLANT
SHEET MEDIUM DRAPE 40X70 STRL (DRAPES) ×3 IMPLANT
SOLUTION ANTI FOG 6CC (MISCELLANEOUS) ×3 IMPLANT
STOCKINETTE IMPERVIOUS 9X36 MD (GAUZE/BANDAGES/DRESSINGS) ×6 IMPLANT
STOPCOCK 4 WAY LG BORE MALE ST (IV SETS) IMPLANT
SUT ETHILON 5.0 S-24 (SUTURE) ×3 IMPLANT
SUT SILK 2 0 (SUTURE) ×2
SUT SILK 2-0 18XBRD TIE 12 (SUTURE) ×1 IMPLANT
SUT VICRYL 7 0 TG140 8 (SUTURE) IMPLANT
SYR 20CC LL (SYRINGE) IMPLANT
TOWEL OR 17X24 6PK STRL BLUE (TOWEL DISPOSABLE) ×6 IMPLANT
WATER STERILE IRR 1000ML POUR (IV SOLUTION) ×3 IMPLANT
WIPE INSTRUMENT VISIWIPE 73X73 (MISCELLANEOUS) IMPLANT

## 2016-10-23 NOTE — Progress Notes (Signed)
Arlyn Dunning, CRNA notified of CBG.

## 2016-10-23 NOTE — Transfer of Care (Signed)
Immediate Anesthesia Transfer of Care Note  Patient: Tara Bullock  Procedure(s) Performed: REPAIR OF TRACTION RETINAL DETACHMENT INFERIOR WITH VITRECTOMY, ENDOLASER AND GAS TAMPONADE, RIGHT EYE (Right Eye)  Patient Location: PACU  Anesthesia Type:MAC  Level of Consciousness: drowsy and responds to stimulation  Airway & Oxygen Therapy: Patient Spontanous Breathing  Post-op Assessment: Report given to RN and Post -op Vital signs reviewed and stable  Post vital signs: Reviewed and stable  Last Vitals:  Vitals:   10/23/16 1347  Pulse: 98  Resp: 20  Temp: 36.9 C  SpO2: 100%    Last Pain:  Vitals:   10/23/16 1420  TempSrc:   PainSc: 3       Patients Stated Pain Goal: 2 (32/35/57 3220)  Complications: No apparent anesthesia complications

## 2016-10-23 NOTE — Op Note (Signed)
BAILEA BEED 10/23/2016 Diagnosis: Tractional retinal detachment right eye  Procedure: Tractional retinal detachment repair right eye: Pars Plana Vitrectomy, Membrane Peeling, Endolaser, Fluid Gas Exchange and membranectomy with 16% SF6 gas tamponade Operative Eye:  right eye  Surgeon: Royston Cowper Estimated Blood Loss: minimal Specimens for Pathology:  None Complications: none    The  patient was prepped and draped in the usual fashion for ocular surgery on the  right eye .  A lid speculum was placed.  Infusion line and trocar was placed at the 8 o'clock position approximately 3.5 mm from the surgical limbus.   The infusion line was allowed to run and then clamped when placed at the cannula opening. The line was inserted and secured to the drape with an adhesive strip.   Active trocars/cannula were placed at the 10 and 2 o'clock positions approximately 3.5 mm from the surgical limbus.  Attention was directed toward limiting any traction on the posterior hyaloid, given the extent of detachment care was taken to remove the vitreous without mobilization of the retina. Attention was directed toward relieving the tractional detachment from the posterior pole in particular around the arcades from the vitreous. This was done carefully at the disc and surrounding arcades. There was notable gliosis at the disc and tracking around the arcades as well as a significant area of gliosis and traction along the superior arcade.  Care was taken to elevate the membranes and remove them both with a vitrector, ILM forceps and a light pipe with dissection. Following careful segmentation and delamination of membranes, attention was directed toward endo-cautery of the neovascular  fronds that were bleeding. Following hemostasis, continued dissection of membranes and removal of membranes was performed over and around the arcades as well as the nasal and superior retina. The area of gliosis and fibrosis  over the arcades and superior retina was elevated and removed. After careful segmentation and delamination was performed around significant areas of traction some gliotic tissue remained along the superior arcade and could not be removed from the retina without further damage to the retina.  Endolaser was applied to the areas where the neovascular fronds were. A complete air/fluid exchange was performed and subretinal fluid was drained from the tractional detachment. Attention was directed to the endolaser portion of the procedure. Endolaser was applied 360 degrees and surrounding areas of gliosis, neovascular fronds and traction. 16% SF6 gas was injected into the eye. The trocars were sequentially removed and all noted to be airtight. Subconjunctival injections of Ancef was placed. Normal intraocular pressure was noted by digital palpapation.    The speculum and drapes were removed and the eye was patched with Polymixin/Bacitracin ophthalmic ointment. An eye shield was placed and the patient was transferred alert and conversant with stable vital signs to the post operative recovery area.  The patient tolerated the procedure well and no complications were noted.  Royston Cowper MD

## 2016-10-23 NOTE — Anesthesia Preprocedure Evaluation (Addendum)
Anesthesia Evaluation  Patient identified by MRN, date of birth, ID band Patient awake    Reviewed: Allergy & Precautions, NPO status , Patient's Chart, lab work & pertinent test results  Airway Mallampati: I  TM Distance: >3 FB Neck ROM: Full    Dental no notable dental hx.    Pulmonary former smoker,    breath sounds clear to auscultation       Cardiovascular hypertension,  Rhythm:Regular Rate:Normal     Neuro/Psych    GI/Hepatic Neg liver ROS, GERD  ,  Endo/Other  diabetes  Renal/GU      Musculoskeletal  (+) Arthritis , Fibromyalgia -  Abdominal   Peds  Hematology   Anesthesia Other Findings   Reproductive/Obstetrics                            Anesthesia Physical Anesthesia Plan  ASA: II  Anesthesia Plan: MAC   Post-op Pain Management:    Induction: Intravenous  PONV Risk Score and Plan: 4 or greater and Ondansetron, Dexamethasone, Midazolam, Scopolamine patch - Pre-op, Propofol infusion and Treatment may vary due to age or medical condition  Airway Management Planned: Oral ETT  Additional Equipment:   Intra-op Plan:   Post-operative Plan: Extubation in OR  Informed Consent: I have reviewed the patients History and Physical, chart, labs and discussed the procedure including the risks, benefits and alternatives for the proposed anesthesia with the patient or authorized representative who has indicated his/her understanding and acceptance.   Dental advisory given  Plan Discussed with: CRNA  Anesthesia Plan Comments:        Anesthesia Quick Evaluation

## 2016-10-23 NOTE — Anesthesia Procedure Notes (Signed)
Procedure Name: MAC Date/Time: 10/23/2016 4:46 PM Performed by: Salli Quarry Darion Milewski Pre-anesthesia Checklist: Patient identified, Emergency Drugs available, Suction available, Patient being monitored and Timeout performed Patient Re-evaluated:Patient Re-evaluated prior to induction Oxygen Delivery Method: Simple face mask Preoxygenation: Pre-oxygenation with 100% oxygen

## 2016-10-23 NOTE — Discharge Instructions (Signed)
DO NOT SLEEP ON BACK, THE EYE PRESSURE CAN GO UP AND CAUSE VISION LOSS   SLEEP ON SIDE WITH NOSE TO PILLOW  DURING DAY KEEP FACE DOWN.  15 MINUTES EVERY 2 HOURS IT IS OK TO LOOK STRAIGHT AHEAD (USE BATHROOM, EAT, WALK, ETC.)  

## 2016-10-23 NOTE — Brief Op Note (Signed)
10/23/2016  6:03 PM  PATIENT:  Ledell Peoples  31 y.o. female  PRE-OPERATIVE DIAGNOSIS:  TRACTIONAL RETINAL DETACHMENT RIGHT EYE  POST-OPERATIVE DIAGNOSIS:  TRACTIONAL RETINAL DETACHMENT RIGHT EYE  PROCEDURE:  Procedure(s): REPAIR OF TRACTION RETINAL DETACHMENT INFERIOR WITH VITRECTOMY, ENDOLASER, MEMBRANECTOMY AND GAS TAMPONADE (16% SF6), RIGHT EYE (Right)  SURGEON:  Surgeon(s) and Role:    * Jalene Mullet, MD - Primary  PHYSICIAN ASSISTANT:   ASSISTANTS: none   ANESTHESIA:   local and MAC  EBL:  5 mL   BLOOD ADMINISTERED:none  DRAINS: none   LOCAL MEDICATIONS USED:  MARCAINE    and LIDOCAINE   SPECIMEN:  No Specimen  DISPOSITION OF SPECIMEN:  N/A  COUNTS:  YES  TOURNIQUET:  * No tourniquets in log *  DICTATION: .Note written in EPIC  PLAN OF CARE: Discharge to home after PACU  PATIENT DISPOSITION:  PACU - hemodynamically stable.   Delay start of Pharmacological VTE agent (>24hrs) due to surgical blood loss or risk of bleeding: not applicable

## 2016-10-24 ENCOUNTER — Encounter (HOSPITAL_COMMUNITY): Payer: Self-pay | Admitting: Ophthalmology

## 2016-10-24 LAB — HEMOGLOBIN A1C
Hgb A1c MFr Bld: 12.9 % — ABNORMAL HIGH (ref 4.8–5.6)
Mean Plasma Glucose: 324 mg/dL

## 2016-10-24 LAB — GLUCOSE, CAPILLARY: Glucose-Capillary: 72 mg/dL (ref 65–99)

## 2016-10-24 NOTE — Anesthesia Postprocedure Evaluation (Signed)
Anesthesia Post Note  Patient: Tara Bullock  Procedure(s) Performed: TRACTION RETINAL DETACHMENT REPAIR, PARS PLANA  VITRECTOMY, MEMBRANE PEELING, FLUID GAS EXCHANGE AND MEMBRANECT0MY WITH 16% SF6 GAS TAMPONADE. RIGHT EYE (Right Eye)     Patient location during evaluation: PACU Anesthesia Type: MAC Level of consciousness: awake and alert Pain management: pain level controlled Vital Signs Assessment: post-procedure vital signs reviewed and stable Respiratory status: spontaneous breathing, nonlabored ventilation, respiratory function stable and patient connected to nasal cannula oxygen Cardiovascular status: stable and blood pressure returned to baseline Postop Assessment: no apparent nausea or vomiting Anesthetic complications: no    Last Vitals:  Vitals:   10/23/16 1815 10/23/16 1830  BP: 127/88 120/90  Pulse: 89 97  Resp: 12 16  Temp:  36.7 C  SpO2: 99% 100%    Last Pain:  Vitals:   10/23/16 1830  TempSrc:   PainSc: 0-No pain                 Rheda Kassab,JAMES TERRILL

## 2016-10-26 NOTE — Discharge Summary (Signed)
DO NOT SLEEP ON BACK, THE EYE PRESSURE CAN GO UP AND CAUSE VISION LOSS   SLEEP ON SIDE WITH NOSE TO PILLOW  DURING DAY KEEP FACE DOWN.  15 MINUTES EVERY 2 HOURS IT IS OK TO LOOK STRAIGHT AHEAD (USE BATHROOM, EAT, WALK, ETC.)  

## 2016-12-03 ENCOUNTER — Encounter (HOSPITAL_COMMUNITY): Payer: Self-pay | Admitting: *Deleted

## 2016-12-03 ENCOUNTER — Other Ambulatory Visit: Payer: Self-pay

## 2016-12-04 ENCOUNTER — Ambulatory Visit (HOSPITAL_COMMUNITY): Payer: Medicaid Other | Admitting: Certified Registered"

## 2016-12-04 ENCOUNTER — Ambulatory Visit (HOSPITAL_COMMUNITY)
Admission: RE | Admit: 2016-12-04 | Discharge: 2016-12-04 | Disposition: A | Discharge status: Home / Self Care | Payer: Medicaid Other | Source: Ambulatory Visit | Attending: Ophthalmology | Admitting: Ophthalmology

## 2016-12-04 ENCOUNTER — Ambulatory Visit: Payer: Self-pay | Admitting: Ophthalmology

## 2016-12-04 ENCOUNTER — Encounter (HOSPITAL_COMMUNITY)
Admission: RE | Disposition: A | Discharge status: Home / Self Care | Payer: Self-pay | Source: Ambulatory Visit | Attending: Ophthalmology

## 2016-12-04 ENCOUNTER — Encounter (HOSPITAL_COMMUNITY): Payer: Self-pay

## 2016-12-04 DIAGNOSIS — Z87891 Personal history of nicotine dependence: Secondary | ICD-10-CM | POA: Diagnosis not present

## 2016-12-04 DIAGNOSIS — H3340 Traction detachment of retina, unspecified eye: Secondary | ICD-10-CM | POA: Insufficient documentation

## 2016-12-04 DIAGNOSIS — K219 Gastro-esophageal reflux disease without esophagitis: Secondary | ICD-10-CM | POA: Insufficient documentation

## 2016-12-04 DIAGNOSIS — H4311 Vitreous hemorrhage, right eye: Secondary | ICD-10-CM | POA: Diagnosis not present

## 2016-12-04 DIAGNOSIS — Z79899 Other long term (current) drug therapy: Secondary | ICD-10-CM | POA: Insufficient documentation

## 2016-12-04 DIAGNOSIS — Z794 Long term (current) use of insulin: Secondary | ICD-10-CM | POA: Insufficient documentation

## 2016-12-04 DIAGNOSIS — E119 Type 2 diabetes mellitus without complications: Secondary | ICD-10-CM | POA: Insufficient documentation

## 2016-12-04 DIAGNOSIS — H5461 Unqualified visual loss, right eye, normal vision left eye: Secondary | ICD-10-CM | POA: Diagnosis not present

## 2016-12-04 HISTORY — PX: PARS PLANA VITRECTOMY: SHX2166

## 2016-12-04 LAB — HCG, SERUM, QUALITATIVE: PREG SERUM: NEGATIVE

## 2016-12-04 LAB — CBC
HCT: 30.6 % — ABNORMAL LOW (ref 36.0–46.0)
Hemoglobin: 9.9 g/dL — ABNORMAL LOW (ref 12.0–15.0)
MCH: 26.3 pg (ref 26.0–34.0)
MCHC: 32.4 g/dL (ref 30.0–36.0)
MCV: 81.2 fL (ref 78.0–100.0)
PLATELETS: 395 10*3/uL (ref 150–400)
RBC: 3.77 MIL/uL — ABNORMAL LOW (ref 3.87–5.11)
RDW: 12.3 % (ref 11.5–15.5)
WBC: 9.2 10*3/uL (ref 4.0–10.5)

## 2016-12-04 LAB — HEMOGLOBIN A1C
HEMOGLOBIN A1C: 13.6 % — AB (ref 4.8–5.6)
Mean Plasma Glucose: 343.62 mg/dL

## 2016-12-04 LAB — BASIC METABOLIC PANEL
ANION GAP: 8 (ref 5–15)
BUN: 32 mg/dL — AB (ref 6–20)
CALCIUM: 8.6 mg/dL — AB (ref 8.9–10.3)
CO2: 26 mmol/L (ref 22–32)
Chloride: 104 mmol/L (ref 101–111)
Creatinine, Ser: 1.78 mg/dL — ABNORMAL HIGH (ref 0.44–1.00)
GFR calc Af Amer: 43 mL/min — ABNORMAL LOW (ref >60)
GFR, EST NON AFRICAN AMERICAN: 37 mL/min — AB (ref >60)
Glucose, Bld: 103 mg/dL — ABNORMAL HIGH (ref 65–99)
POTASSIUM: 3.9 mmol/L (ref 3.5–5.1)
SODIUM: 138 mmol/L (ref 135–145)

## 2016-12-04 LAB — GLUCOSE, CAPILLARY
GLUCOSE-CAPILLARY: 98 mg/dL (ref 65–99)
GLUCOSE-CAPILLARY: 47 mg/dL — AB (ref 65–99)
GLUCOSE-CAPILLARY: 54 mg/dL — AB (ref 65–99)
Glucose-Capillary: 80 mg/dL (ref 65–99)
Glucose-Capillary: 90 mg/dL (ref 65–99)

## 2016-12-04 SURGERY — PARS PLANA VITRECTOMY WITH 25 GAUGE
Anesthesia: Monitor Anesthesia Care | Site: Eye | Laterality: Right

## 2016-12-04 MED ORDER — TETRACAINE HCL 0.5 % OP SOLN
OPHTHALMIC | Status: AC
  Filled 2016-12-04: qty 4

## 2016-12-04 MED ORDER — PHENYLEPHRINE HCL 10 % OP SOLN
1.0000 [drp] | OPHTHALMIC | Status: AC | PRN
  Administered 2016-12-04 (×3): 1 [drp] via OPHTHALMIC

## 2016-12-04 MED ORDER — BSS PLUS IO SOLN
INTRAOCULAR | Status: AC
  Filled 2016-12-04: qty 500

## 2016-12-04 MED ORDER — TOBRAMYCIN-DEXAMETHASONE 0.3-0.1 % OP OINT
TOPICAL_OINTMENT | OPHTHALMIC | Status: AC
  Filled 2016-12-04: qty 3.5

## 2016-12-04 MED ORDER — BUPIVACAINE HCL (PF) 0.75 % IJ SOLN
INTRAMUSCULAR | Status: AC
  Filled 2016-12-04: qty 10

## 2016-12-04 MED ORDER — BSS IO SOLN
INTRAOCULAR | Status: AC
  Filled 2016-12-04: qty 15

## 2016-12-04 MED ORDER — DEXAMETHASONE SODIUM PHOSPHATE 10 MG/ML IJ SOLN
INTRAMUSCULAR | Status: AC
  Filled 2016-12-04: qty 1

## 2016-12-04 MED ORDER — LIDOCAINE 2% (20 MG/ML) 5 ML SYRINGE
INTRAMUSCULAR | Status: AC
  Filled 2016-12-04: qty 5

## 2016-12-04 MED ORDER — MIDAZOLAM HCL 2 MG/2ML IJ SOLN
INTRAMUSCULAR | Status: AC
  Filled 2016-12-04: qty 2

## 2016-12-04 MED ORDER — FENTANYL CITRATE (PF) 250 MCG/5ML IJ SOLN
INTRAMUSCULAR | Status: AC
  Filled 2016-12-04: qty 5

## 2016-12-04 MED ORDER — HYALURONIDASE HUMAN 150 UNIT/ML IJ SOLN
INTRAMUSCULAR | Status: AC
  Filled 2016-12-04: qty 1

## 2016-12-04 MED ORDER — TOBRAMYCIN-DEXAMETHASONE 0.3-0.1 % OP OINT
TOPICAL_OINTMENT | OPHTHALMIC | Status: DC | PRN
  Administered 2016-12-04: 1 via OPHTHALMIC

## 2016-12-04 MED ORDER — DEXAMETHASONE SODIUM PHOSPHATE 10 MG/ML IJ SOLN
INTRAMUSCULAR | Status: DC | PRN
  Administered 2016-12-04: 10 mg

## 2016-12-04 MED ORDER — OFLOXACIN 0.3 % OP SOLN: 1.0000 [drp] | OPHTHALMIC | Status: DC | PRN

## 2016-12-04 MED ORDER — OFLOXACIN 0.3 % OP SOLN
OPHTHALMIC | Status: AC
  Administered 2016-12-04: 1 [drp] via OPHTHALMIC
  Filled 2016-12-04: qty 5

## 2016-12-04 MED ORDER — EPINEPHRINE PF 1 MG/ML IJ SOLN
INTRAOCULAR | Status: DC | PRN
  Administered 2016-12-04: .3 mL

## 2016-12-04 MED ORDER — LIDOCAINE HCL 2 % IJ SOLN
INTRAMUSCULAR | Status: AC
  Filled 2016-12-04: qty 20

## 2016-12-04 MED ORDER — SODIUM CHLORIDE 0.9 % IV SOLN
INTRAVENOUS | Status: DC | PRN
  Administered 2016-12-04 (×2): via INTRAVENOUS

## 2016-12-04 MED ORDER — CEFAZOLIN 200 MG/ML FOR DIALYSIS
INJECTION | INTRAOCULAR | Status: DC | PRN
Start: 2016-12-04 — End: 2016-12-04
  Administered 2016-12-04: .5 mL

## 2016-12-04 MED ORDER — LIDOCAINE HCL 2 % IJ SOLN
INTRAMUSCULAR | Status: DC | PRN
  Administered 2016-12-04: 7 mL via RETROBULBAR

## 2016-12-04 MED ORDER — BSS PLUS IO SOLN
INTRAOCULAR | Status: DC | PRN
Start: 2016-12-04 — End: 2016-12-04
  Administered 2016-12-04: 1 via INTRAOCULAR

## 2016-12-04 MED ORDER — FENTANYL CITRATE (PF) 250 MCG/5ML IJ SOLN
INTRAMUSCULAR | Status: DC | PRN
  Administered 2016-12-04: 25 ug via INTRAVENOUS
  Administered 2016-12-04 (×2): 50 ug via INTRAVENOUS

## 2016-12-04 MED ORDER — EPINEPHRINE PF 1 MG/ML IJ SOLN
INTRAMUSCULAR | Status: AC
  Filled 2016-12-04: qty 1

## 2016-12-04 MED ORDER — PHENYLEPHRINE HCL 2.5 % OP SOLN
1.0000 [drp] | OPHTHALMIC | Status: AC | PRN
  Administered 2016-12-04 (×3): 1 [drp] via OPHTHALMIC

## 2016-12-04 MED ORDER — OFLOXACIN 0.3 % OP SOLN
1.0000 [drp] | OPHTHALMIC | Status: AC | PRN
  Administered 2016-12-04 (×3): 1 [drp] via OPHTHALMIC

## 2016-12-04 MED ORDER — CEFAZOLIN SUBCONJUNCTIVAL INJECTION 100 MG/0.5 ML
100.0000 mg | INJECTION | SUBCONJUNCTIVAL | Status: DC
  Filled 2016-12-04 (×2): qty 5

## 2016-12-04 MED ORDER — LABETALOL HCL 5 MG/ML IV SOLN
INTRAVENOUS | Status: AC
  Administered 2016-12-04: 5 mg
  Filled 2016-12-04: qty 4

## 2016-12-04 MED ORDER — HYPROMELLOSE (GONIOSCOPIC) 2.5 % OP SOLN
OPHTHALMIC | Status: AC
  Filled 2016-12-04: qty 15

## 2016-12-04 MED ORDER — PHENYLEPHRINE HCL 10 % OP SOLN
OPHTHALMIC | Status: AC
  Administered 2016-12-04: 1 [drp] via OPHTHALMIC
  Filled 2016-12-04: qty 5

## 2016-12-04 MED ORDER — PHENYLEPHRINE HCL 2.5 % OP SOLN
OPHTHALMIC | Status: AC
  Administered 2016-12-04: 1 [drp] via OPHTHALMIC
  Filled 2016-12-04: qty 2

## 2016-12-04 MED ORDER — HYPROMELLOSE (GONIOSCOPIC) 2.5 % OP SOLN
OPHTHALMIC | Status: DC | PRN
  Administered 2016-12-04: 2 [drp] via OPHTHALMIC

## 2016-12-04 MED ORDER — BSS IO SOLN
INTRAOCULAR | Status: DC | PRN
  Administered 2016-12-04: 15 mL

## 2016-12-04 MED ORDER — PROPOFOL 10 MG/ML IV BOLUS
INTRAVENOUS | Status: AC
  Filled 2016-12-04: qty 20

## 2016-12-04 MED ORDER — MIDAZOLAM HCL 2 MG/2ML IJ SOLN
INTRAMUSCULAR | Status: DC | PRN
  Administered 2016-12-04: 2 mg via INTRAVENOUS

## 2016-12-04 MED ORDER — ATROPINE SULFATE 1 % OP SOLN
OPHTHALMIC | Status: AC
  Filled 2016-12-04: qty 5

## 2016-12-04 SURGICAL SUPPLY — 52 items
APPLICATOR COTTON TIP 6IN STRL (MISCELLANEOUS) ×3 IMPLANT
BLADE MVR KNIFE 20G (BLADE) IMPLANT
CANNULA ANT CHAM MAIN (OPHTHALMIC RELATED) IMPLANT
CANNULA DUAL BORE 23G (CANNULA) IMPLANT
CANNULA DUALBORE 25G (CANNULA) IMPLANT
CANNULA VLV SOFT TIP 25GA (OPHTHALMIC) ×3 IMPLANT
CAUTERY EYE LOW TEMP 1300F FIN (OPHTHALMIC RELATED) IMPLANT
CLOSURE STERI-STRIP 1/2X4 (GAUZE/BANDAGES/DRESSINGS) ×1
CLSR STERI-STRIP ANTIMIC 1/2X4 (GAUZE/BANDAGES/DRESSINGS) ×2 IMPLANT
COVER MAYO STAND STRL (DRAPES) ×3 IMPLANT
DRAPE HALF SHEET 40X57 (DRAPES) ×3 IMPLANT
DRAPE INCISE 51X51 W/FILM STRL (DRAPES) IMPLANT
DRAPE RETRACTOR (MISCELLANEOUS) ×3 IMPLANT
ERASER HMR WETFIELD 23G BP (MISCELLANEOUS) IMPLANT
FORCEPS ECKARDT ILM 25G SERR (OPHTHALMIC RELATED) IMPLANT
FORCEPS GRIESHABER ILM 25G A (INSTRUMENTS) IMPLANT
GAS AUTO FILL CONSTEL (OPHTHALMIC)
GAS AUTO FILL CONSTELLATION (OPHTHALMIC) IMPLANT
GLOVE ECLIPSE 7.5 STRL STRAW (GLOVE) ×6 IMPLANT
GOWN STRL REUS W/ TWL LRG LVL3 (GOWN DISPOSABLE) ×1 IMPLANT
GOWN STRL REUS W/TWL LRG LVL3 (GOWN DISPOSABLE) ×2
HANDLE PNEUMATIC FOR CONSTEL (OPHTHALMIC) IMPLANT
KIT BASIN OR (CUSTOM PROCEDURE TRAY) ×3 IMPLANT
KIT ROOM TURNOVER OR (KITS) ×3 IMPLANT
LENS BIOM SUPER VIEW SET DISP (OPHTHALMIC RELATED) ×3 IMPLANT
MICROPICK 25G (MISCELLANEOUS)
NEEDLE 18GX1X1/2 (RX/OR ONLY) (NEEDLE) ×3 IMPLANT
NEEDLE 25GX 5/8IN NON SAFETY (NEEDLE) ×3 IMPLANT
NEEDLE FILTER BLUNT 18X 1/2SAF (NEEDLE) ×2
NEEDLE FILTER BLUNT 18X1 1/2 (NEEDLE) ×1 IMPLANT
NEEDLE HYPO 25GX1X1/2 BEV (NEEDLE) IMPLANT
NEEDLE HYPO 30X.5 LL (NEEDLE) ×6 IMPLANT
NEEDLE RETROBULBAR 25GX1.5 (NEEDLE) IMPLANT
NS IRRIG 1000ML POUR BTL (IV SOLUTION) ×3 IMPLANT
PACK VITRECTOMY CUSTOM (CUSTOM PROCEDURE TRAY) ×3 IMPLANT
PAD ARMBOARD 7.5X6 YLW CONV (MISCELLANEOUS) ×6 IMPLANT
PAK PIK VITRECTOMY CVS 25GA (OPHTHALMIC) ×3 IMPLANT
PENCIL BIPOLAR 25GA STR DISP (OPHTHALMIC RELATED) IMPLANT
PICK MICROPICK 25G (MISCELLANEOUS) IMPLANT
PROBE LASER ILLUM FLEX CVD 25G (OPHTHALMIC) IMPLANT
ROLLS DENTAL (MISCELLANEOUS) IMPLANT
SCRAPER DIAMOND 25GA (OPHTHALMIC RELATED) IMPLANT
SOLUTION ANTI FOG 6CC (MISCELLANEOUS) IMPLANT
STOPCOCK 4 WAY LG BORE MALE ST (IV SETS) IMPLANT
SUT VICRYL 7 0 TG140 8 (SUTURE) IMPLANT
SUT VICRYL 8 0 TG140 8 (SUTURE) IMPLANT
SYR 10ML LL (SYRINGE) IMPLANT
SYR 20CC LL (SYRINGE) IMPLANT
SYR 5ML LL (SYRINGE) ×3 IMPLANT
SYR TB 1ML LUER SLIP (SYRINGE) IMPLANT
WATER STERILE IRR 1000ML POUR (IV SOLUTION) ×3 IMPLANT
WIPE INSTRUMENT VISIWIPE 73X73 (MISCELLANEOUS) IMPLANT

## 2016-12-04 NOTE — Transfer of Care (Signed)
Immediate Anesthesia Transfer of Care Note  Patient: Tara Bullock  Procedure(s) Performed: PARS PLANA VITRECTOMY WITH 25 GAUGE WITH ENDO LASER (Right Eye)  Patient Location: PACU  Anesthesia Type:MAC  Level of Consciousness: awake and patient cooperative  Airway & Oxygen Therapy: Patient Spontanous Breathing  Post-op Assessment: Report given to RN and Post -op Vital signs reviewed and stable  Post vital signs: Reviewed and stable  Last Vitals:  Vitals:   12/04/16 1346  BP: (!) 189/101  Pulse: 90  Resp: 18  Temp: 37 C  SpO2: 100%    Last Pain:  Vitals:   12/04/16 1346  TempSrc: Oral  PainSc:       Patients Stated Pain Goal: 0 (09/81/19 1478)  Complications: No apparent anesthesia complications

## 2016-12-04 NOTE — Discharge Instructions (Signed)
DO NOT SLEEP ON BACK, THE EYE PRESSURE CAN GO UP AND CAUSE VISION LOSS   SLEEP ON SIDE WITH NOSE TO PILLOW  DURING DAY KEEP UPRIGHT.  DO NOT SLEEP ON BACK UNTIL AIR BUBBLE HAS DISAPPEARED.

## 2016-12-04 NOTE — H&P (Signed)
Date of examination:  12/03/16  Indication for surgery: recurrent vitreous hemorrhage right eye  Pertinent past medical history:  Past Medical History:  Diagnosis Date  . Anorexia nervosa   . Anxiety   . Arthritis   . Depression   . Fibromyalgia   . GERD (gastroesophageal reflux disease)   . Headache   . History of hydronephrosis    left ureteral placement 04-02-2015  resolved  . Hypertension   . IBS (irritable bowel syndrome)   . Insomnia   . Neuromuscular disorder ([place])   . Pneumonia   . Renal cyst, left   . Retained ureteral stent    left  . Rheumatoid factor positive   . 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 Wears glasses     Pertinent ocular history:  Recurrent vitreous hemorrhage right eye   Pertinent family history:  Family History  Problem Relation Age of Onset  . Hypertension Mother   . Hypertension Father     General:  Healthy appearing patient in no distress.  Eyes:    Acuity OD HM    External: Within normal limits      Anterior segment: Within normal limits      Motility:      Fundus: No view OD - B-scan - retina attached      Impression: Recurrent vitreous hemorrhage right eye    Plan: Pars plana vitrectomy with endolaser right eye  Royston Cowper

## 2016-12-04 NOTE — Anesthesia Preprocedure Evaluation (Signed)
Anesthesia Evaluation  Patient identified by MRN, date of birth, ID band Patient awake    Reviewed: Allergy & Precautions, NPO status , Patient's Chart, lab work & pertinent test results  Airway Mallampati: II  TM Distance: >3 FB Neck ROM: Full    Dental  (+) Teeth Intact, Dental Advisory Given   Pulmonary Current Smoker, former smoker,    breath sounds clear to auscultation       Cardiovascular hypertension,  Rhythm:Regular Rate:Normal     Neuro/Psych  Headaches, PSYCHIATRIC DISORDERS Anxiety Depression  Neuromuscular disease    GI/Hepatic Neg liver ROS, GERD  Controlled and Medicated,  Endo/Other  diabetes, Poorly Controlled, Type 1, Insulin Dependent  Renal/GU Renal InsufficiencyRenal disease  negative genitourinary   Musculoskeletal  (+) Arthritis , Osteoarthritis,  Fibromyalgia -  Abdominal   Peds negative pediatric ROS (+)  Hematology negative hematology ROS (+)   Anesthesia Other Findings Day of surgery medications reviewed with the patient.  Reproductive/Obstetrics                             Anesthesia Physical  Anesthesia Plan  ASA: II  Anesthesia Plan: MAC   Post-op Pain Management:    Induction: Intravenous  PONV Risk Score and Plan: Propofol infusion  Airway Management Planned: Natural Airway and Nasal Cannula  Additional Equipment:   Intra-op Plan:   Post-operative Plan:   Informed Consent: I have reviewed the patients History and Physical, chart, labs and discussed the procedure including the risks, benefits and alternatives for the proposed anesthesia with the patient or authorized representative who has indicated his/her understanding and acceptance.     Plan Discussed with: CRNA  Anesthesia Plan Comments: (P)        Anesthesia Quick Evaluation

## 2016-12-04 NOTE — Progress Notes (Addendum)
Patient's blood pressure is elevated at 195/110.  Called Dr. Smith Robert and made him aware. He said he would be by to see the patient. Reported  Off to Sheilah Mins, Therapist, sports.

## 2016-12-04 NOTE — Anesthesia Procedure Notes (Signed)
Procedure Name: MAC Date/Time: 12/04/2016 3:52 PM Performed by: Lance Coon, CRNA Pre-anesthesia Checklist: Patient identified, Emergency Drugs available, Suction available, Patient being monitored and Timeout performed Patient Re-evaluated:Patient Re-evaluated prior to induction Oxygen Delivery Method: Nasal cannula

## 2016-12-04 NOTE — Op Note (Signed)
Tara Bullock 12/04/2016 Diagnosis: Vitreous Hemorrhage right eye  Procedure: Pars Plana Vitrectomy and Endolaser Operative Eye:  right eye  Surgeon: Royston Cowper Estimated Blood Loss: minimal Specimens for Pathology:  None Complications: none   The  patient was prepped and draped in the usual fashion for ocular surgery on the  right eye .  A lid speculum was placed.  Infusion line and trocar was placed at the 8 o'clock position approximately 3.5 mm from the surgical limbus.   The infusion line was allowed to run and then clamped when placed at the cannula opening. The line was inserted and secured to the drape with an adhesive strip.   Active trocars/cannula were placed at the 10 and 2 o'clock positions approximately 3.5 mm from the surgical limbus. The cannula was visualized in the vitreous cavity.  The light pipe and vitreous cutter were inserted into the vitreous cavity and the hemorrhage was removed.  Care taken to remove the vitreous just posterior the lens capsule.  Hemorrhage along the posterior capsule obscured the posterior pole   An area of gliosis and traction was noted along the superior arcade.  Hemorrhage was irrigated free from the macular surface.  Endolaser was applied surrounding the area of gliosis and suspected site of hemorrhage.  A partial air-fluid exchange was performed.  The superior cannulas were sequentially removed with concommitant tamponade using a cotton tipped applicator and noted to be air tight.  The infusion line and trocar were removed and the sclerotomy was noted to be air tight with normal intraocular pressure by digital palpapation.  Subconjunctival injections of  Dexamethasone 4mg /31ml was placed in the infero-medial quadrant.   The speculum and drapes were removed and the eye was patched with Polymixin/Bacitracin ophthalmic ointment. An eye shield was placed and the patient was transferred alert and conversant with stable vital signs to the  post operative recovery area.  The patient tolerated the procedure well and no complications were noted.  Royston Cowper MD

## 2016-12-04 NOTE — Progress Notes (Signed)
Repeat glucose was 64, pt ate graham crackers and apple juice given to pt and repeat check was 90

## 2016-12-04 NOTE — Anesthesia Postprocedure Evaluation (Addendum)
Anesthesia Post Note  Patient: Tara Bullock  Procedure(s) Performed: PARS PLANA VITRECTOMY WITH 25 GAUGE WITH ENDO LASER (Right Eye)     Patient location during evaluation: PACU Anesthesia Type: MAC Level of consciousness: awake and alert Pain management: pain level controlled Vital Signs Assessment: post-procedure vital signs reviewed and stable Respiratory status: spontaneous breathing Cardiovascular status: stable Postop Assessment: no apparent nausea or vomiting Anesthetic complications: no Comments: BG 90's    Last Vitals:  Vitals:   12/04/16 1741 12/04/16 1743  BP:  121/77  Pulse:  88  Resp:  10  Temp: (!) 36.1 C   SpO2:  100%    Last Pain:  Vitals:   12/04/16 1346  TempSrc: Oral  PainSc:                  Effie Berkshire

## 2016-12-04 NOTE — Progress Notes (Signed)
Pt s blood sugar of 47, 2 orange juices given , will repeat glucose check

## 2016-12-04 NOTE — Progress Notes (Signed)
Called and spoke with Dr. Posey Pronto, requested that he enter any orders for eyedrops, etc.  He verbalized understanding and said he would enter orders now.

## 2016-12-04 NOTE — Brief Op Note (Signed)
12/04/2016  4:44 PM  PATIENT:  Tara Bullock  31 y.o. female  PRE-OPERATIVE DIAGNOSIS:  VITEROUS HEMORRHAGE OF RIGHT EYE  POST-OPERATIVE DIAGNOSIS:  VITEROUS HEMORRHAGE OF RIGHT EYE  PROCEDURE:  Procedure(s): PARS PLANA VITRECTOMY WITH 25 GAUGE WITH ENDO LASER (Right)  SURGEON:  Surgeon(s) and Role:    * Jalene Mullet, MD - Primary  PHYSICIAN ASSISTANT:   ASSISTANTS: none   ANESTHESIA:   local and MAC  EBL:  0 mL   BLOOD ADMINISTERED:none  DRAINS: none   LOCAL MEDICATIONS USED:  MARCAINE    and LIDOCAINE   SPECIMEN:  No Specimen  DISPOSITION OF SPECIMEN:  N/A  COUNTS:  YES  TOURNIQUET:  * No tourniquets in log *  DICTATION: .Note written in EPIC  PLAN OF CARE: Discharge to home after PACU  PATIENT DISPOSITION:  PACU - hemodynamically stable.   Delay start of Pharmacological VTE agent (>24hrs) due to surgical blood loss or risk of bleeding: not applicable

## 2016-12-05 ENCOUNTER — Encounter (HOSPITAL_COMMUNITY): Payer: Self-pay | Admitting: Ophthalmology

## 2017-05-07 ENCOUNTER — Other Ambulatory Visit: Payer: Self-pay | Admitting: Physician Assistant

## 2017-05-07 DIAGNOSIS — N6310 Unspecified lump in the right breast, unspecified quadrant: Secondary | ICD-10-CM

## 2017-05-07 DIAGNOSIS — N6019 Diffuse cystic mastopathy of unspecified breast: Secondary | ICD-10-CM

## 2017-05-13 ENCOUNTER — Other Ambulatory Visit: Payer: Self-pay | Admitting: Physician Assistant

## 2017-05-13 ENCOUNTER — Other Ambulatory Visit: Payer: Medicaid Other

## 2017-05-13 ENCOUNTER — Ambulatory Visit
Admission: RE | Admit: 2017-05-13 | Discharge: 2017-05-13 | Disposition: A | Discharge status: Home / Self Care | Payer: Medicaid Other | Source: Ambulatory Visit | Attending: Family Medicine | Admitting: Family Medicine

## 2017-05-13 DIAGNOSIS — N6019 Diffuse cystic mastopathy of unspecified breast: Secondary | ICD-10-CM

## 2017-05-13 DIAGNOSIS — N6310 Unspecified lump in the right breast, unspecified quadrant: Secondary | ICD-10-CM

## 2017-05-24 ENCOUNTER — Other Ambulatory Visit: Payer: Self-pay | Admitting: Physician Assistant

## 2017-05-24 DIAGNOSIS — N6310 Unspecified lump in the right breast, unspecified quadrant: Secondary | ICD-10-CM

## 2017-06-05 ENCOUNTER — Other Ambulatory Visit: Payer: Self-pay | Admitting: Physician Assistant

## 2017-06-05 ENCOUNTER — Ambulatory Visit
Admission: RE | Admit: 2017-06-05 | Discharge: 2017-06-05 | Disposition: A | Discharge status: Home / Self Care | Payer: Medicaid Other | Source: Ambulatory Visit | Attending: Physician Assistant | Admitting: Physician Assistant

## 2017-06-05 DIAGNOSIS — N6310 Unspecified lump in the right breast, unspecified quadrant: Secondary | ICD-10-CM

## 2017-06-17 ENCOUNTER — Other Ambulatory Visit: Payer: Self-pay | Admitting: Nephrology

## 2017-06-17 DIAGNOSIS — R809 Proteinuria, unspecified: Secondary | ICD-10-CM

## 2017-06-17 DIAGNOSIS — I159 Secondary hypertension, unspecified: Secondary | ICD-10-CM

## 2017-06-17 DIAGNOSIS — N184 Chronic kidney disease, stage 4 (severe): Secondary | ICD-10-CM

## 2017-07-17 ENCOUNTER — Ambulatory Visit
Admission: RE | Admit: 2017-07-17 | Discharge: 2017-07-17 | Disposition: A | Discharge status: Home / Self Care | Payer: Medicaid Other | Source: Ambulatory Visit | Attending: Nephrology | Admitting: Nephrology

## 2017-07-17 DIAGNOSIS — R809 Proteinuria, unspecified: Secondary | ICD-10-CM

## 2017-07-17 DIAGNOSIS — I159 Secondary hypertension, unspecified: Secondary | ICD-10-CM

## 2017-07-17 DIAGNOSIS — N184 Chronic kidney disease, stage 4 (severe): Secondary | ICD-10-CM

## 2017-07-18 ENCOUNTER — Other Ambulatory Visit: Payer: Self-pay | Admitting: Nephrology

## 2017-07-18 DIAGNOSIS — N184 Chronic kidney disease, stage 4 (severe): Secondary | ICD-10-CM

## 2017-08-05 ENCOUNTER — Other Ambulatory Visit: Payer: Medicaid Other

## 2017-09-26 ENCOUNTER — Other Ambulatory Visit (HOSPITAL_COMMUNITY): Payer: Self-pay

## 2017-09-27 ENCOUNTER — Encounter (HOSPITAL_COMMUNITY): Payer: Medicaid Other

## 2017-10-25 ENCOUNTER — Encounter (HOSPITAL_COMMUNITY): Payer: Medicaid Other

## 2017-11-01 ENCOUNTER — Ambulatory Visit (HOSPITAL_COMMUNITY)
Admission: RE | Admit: 2017-11-01 | Discharge: 2017-11-01 | Disposition: A | Discharge status: Home / Self Care | Payer: Medicaid Other | Source: Ambulatory Visit | Attending: Nephrology | Admitting: Nephrology

## 2017-11-01 DIAGNOSIS — D631 Anemia in chronic kidney disease: Secondary | ICD-10-CM | POA: Diagnosis present

## 2017-11-01 DIAGNOSIS — N184 Chronic kidney disease, stage 4 (severe): Secondary | ICD-10-CM | POA: Insufficient documentation

## 2017-11-01 LAB — POCT HEMOGLOBIN-HEMACUE: HEMOGLOBIN: 10 g/dL — AB (ref 12.0–15.0)

## 2017-11-01 MED ORDER — EPOETIN ALFA 10000 UNIT/ML IJ SOLN
10000.0000 [IU] | INTRAMUSCULAR | Status: DC
  Administered 2017-11-01: 10000 [IU] via SUBCUTANEOUS

## 2017-11-01 MED ORDER — EPOETIN ALFA 10000 UNIT/ML IJ SOLN
INTRAMUSCULAR | Status: AC
  Filled 2017-11-01: qty 1

## 2017-11-01 NOTE — Discharge Instructions (Signed)
Epoetin Alfa injection °What is this medicine? °EPOETIN ALFA (e POE e tin AL fa) helps your body make more red blood cells. This medicine is used to treat anemia caused by chronic kidney failure, cancer chemotherapy, or HIV-therapy. It may also be used before surgery if you have anemia. °This medicine may be used for other purposes; ask your health care provider or pharmacist if you have questions. °COMMON BRAND NAME(S): Epogen, Procrit °What should I tell my health care provider before I take this medicine? °They need to know if you have any of these conditions: °-blood clotting disorders °-cancer patient not on chemotherapy °-cystic fibrosis °-heart disease, such as angina or heart failure °-hemoglobin level of 12 g/dL or greater °-high blood pressure °-low levels of folate, iron, or vitamin B12 °-seizures °-an unusual or allergic reaction to erythropoietin, albumin, benzyl alcohol, hamster proteins, other medicines, foods, dyes, or preservatives °-pregnant or trying to get pregnant °-breast-feeding °How should I use this medicine? °This medicine is for injection into a vein or under the skin. It is usually given by a health care professional in a hospital or clinic setting. °If you get this medicine at home, you will be taught how to prepare and give this medicine. Use exactly as directed. Take your medicine at regular intervals. Do not take your medicine more often than directed. °It is important that you put your used needles and syringes in a special sharps container. Do not put them in a trash can. If you do not have a sharps container, call your pharmacist or healthcare provider to get one. °A special MedGuide will be given to you by the pharmacist with each prescription and refill. Be sure to read this information carefully each time. °Talk to your pediatrician regarding the use of this medicine in children. While this drug may be prescribed for selected conditions, precautions do apply. °Overdosage: If you  think you have taken too much of this medicine contact a poison control center or emergency room at once. °NOTE: This medicine is only for you. Do not share this medicine with others. °What if I miss a dose? °If you miss a dose, take it as soon as you can. If it is almost time for your next dose, take only that dose. Do not take double or extra doses. °What may interact with this medicine? °Do not take this medicine with any of the following medications: °-darbepoetin alfa °This list may not describe all possible interactions. Give your health care provider a list of all the medicines, herbs, non-prescription drugs, or dietary supplements you use. Also tell them if you smoke, drink alcohol, or use illegal drugs. Some items may interact with your medicine. °What should I watch for while using this medicine? °Your condition will be monitored carefully while you are receiving this medicine. °You may need blood work done while you are taking this medicine. °What side effects may I notice from receiving this medicine? °Side effects that you should report to your doctor or health care professional as soon as possible: °-allergic reactions like skin rash, itching or hives, swelling of the face, lips, or tongue °-breathing problems °-changes in vision °-chest pain °-confusion, trouble speaking or understanding °-feeling faint or lightheaded, falls °-high blood pressure °-muscle aches or pains °-pain, swelling, warmth in the leg °-rapid weight gain °-severe headaches °-sudden numbness or weakness of the face, arm or leg °-trouble walking, dizziness, loss of balance or coordination °-seizures (convulsions) °-swelling of the ankles, feet, hands °-unusually weak or tired °  Side effects that usually do not require medical attention (report to your doctor or health care professional if they continue or are bothersome): °-diarrhea °-fever, chills (flu-like symptoms) °-headaches °-nausea, vomiting °-redness, stinging, or swelling at  site where injected °This list may not describe all possible side effects. Call your doctor for medical advice about side effects. You may report side effects to FDA at 1-800-FDA-1088. °Where should I keep my medicine? °Keep out of the reach of children. °Store in a refrigerator between 2 and 8 degrees C (36 and 46 degrees F). Do not freeze or shake. Throw away any unused portion if using a single-dose vial. Multi-dose vials can be kept in the refrigerator for up to 21 days after the initial dose. Throw away unused medicine. °NOTE: This sheet is a summary. It may not cover all possible information. If you have questions about this medicine, talk to your doctor, pharmacist, or health care provider. °© 2018 [employer] (2015-08-08 19:42:31) ° °

## 2017-11-01 NOTE — Progress Notes (Signed)
Pt here today for first dose of procrit.  Prior to treatment she stated she felt light headed and asked to be walked out to her car after she was treated.  VSS, HGB was 10.  Pt stated she had eaten and drank fluids today.  I asked her if she felt like she needed to be seen down in the ER and she said no.  I asked if she wanted to be pushed out in a wheelchair and she stated no.  I asked her are you sure you are okay to drive home today and she stated yes Im okay.  I had our nurse tech walk with her to her car to be sure she didn't need anything and she made it without difficulty.

## 2017-11-27 ENCOUNTER — Other Ambulatory Visit (HOSPITAL_COMMUNITY): Payer: Self-pay

## 2017-11-29 ENCOUNTER — Inpatient Hospital Stay (HOSPITAL_COMMUNITY): Admission: RE | Admit: 2017-11-29 | Payer: Medicaid Other | Source: Ambulatory Visit

## 2017-12-06 ENCOUNTER — Ambulatory Visit
Admission: RE | Admit: 2017-12-06 | Discharge: 2017-12-06 | Disposition: A | Discharge status: Home / Self Care | Payer: Medicaid Other | Source: Ambulatory Visit | Attending: Physician Assistant | Admitting: Physician Assistant

## 2017-12-06 ENCOUNTER — Other Ambulatory Visit: Payer: Self-pay | Admitting: Physician Assistant

## 2017-12-06 DIAGNOSIS — N6310 Unspecified lump in the right breast, unspecified quadrant: Secondary | ICD-10-CM

## 2017-12-09 ENCOUNTER — Encounter (HOSPITAL_COMMUNITY)
Admission: RE | Admit: 2017-12-09 | Discharge: 2017-12-09 | Disposition: A | Discharge status: Home / Self Care | Payer: Medicaid Other | Source: Ambulatory Visit | Attending: Nephrology | Admitting: Nephrology

## 2017-12-09 DIAGNOSIS — N186 End stage renal disease: Secondary | ICD-10-CM | POA: Insufficient documentation

## 2017-12-09 DIAGNOSIS — D631 Anemia in chronic kidney disease: Secondary | ICD-10-CM | POA: Insufficient documentation

## 2017-12-09 LAB — IRON AND TIBC
Iron: 54 ug/dL (ref 28–170)
Saturation Ratios: 20 % (ref 10.4–31.8)
TIBC: 266 ug/dL (ref 250–450)
UIBC: 212 ug/dL

## 2017-12-09 LAB — POCT HEMOGLOBIN-HEMACUE: Hemoglobin: 9.6 g/dL — ABNORMAL LOW (ref 12.0–15.0)

## 2017-12-09 LAB — FERRITIN: Ferritin: 67 ng/mL (ref 11–307)

## 2017-12-09 MED ORDER — EPOETIN ALFA 10000 UNIT/ML IJ SOLN
INTRAMUSCULAR | Status: AC
  Filled 2017-12-09: qty 1

## 2017-12-09 MED ORDER — EPOETIN ALFA 10000 UNIT/ML IJ SOLN
10000.0000 [IU] | INTRAMUSCULAR | Status: DC
  Administered 2017-12-09: 10000 [IU] via SUBCUTANEOUS

## 2017-12-27 ENCOUNTER — Encounter (HOSPITAL_COMMUNITY): Payer: Medicaid Other

## 2018-01-03 ENCOUNTER — Other Ambulatory Visit (HOSPITAL_COMMUNITY): Payer: Self-pay | Admitting: *Deleted

## 2018-01-06 ENCOUNTER — Encounter (HOSPITAL_COMMUNITY)
Admission: RE | Admit: 2018-01-06 | Discharge: 2018-01-06 | Disposition: A | Discharge status: Home / Self Care | Payer: Medicaid Other | Source: Ambulatory Visit | Attending: Nephrology | Admitting: Nephrology

## 2018-01-06 ENCOUNTER — Other Ambulatory Visit: Payer: Self-pay

## 2018-01-06 DIAGNOSIS — D631 Anemia in chronic kidney disease: Secondary | ICD-10-CM | POA: Insufficient documentation

## 2018-01-06 DIAGNOSIS — N186 End stage renal disease: Secondary | ICD-10-CM | POA: Insufficient documentation

## 2018-01-06 DIAGNOSIS — N185 Chronic kidney disease, stage 5: Secondary | ICD-10-CM

## 2018-01-06 LAB — POCT HEMOGLOBIN-HEMACUE: HEMOGLOBIN: 9.9 g/dL — AB (ref 12.0–15.0)

## 2018-01-06 MED ORDER — EPOETIN ALFA 10000 UNIT/ML IJ SOLN
10000.0000 [IU] | INTRAMUSCULAR | Status: DC
  Administered 2018-01-06: 10000 [IU] via SUBCUTANEOUS

## 2018-01-06 MED ORDER — EPOETIN ALFA 10000 UNIT/ML IJ SOLN
INTRAMUSCULAR | Status: AC
  Filled 2018-01-06: qty 1

## 2018-01-06 MED ORDER — FERUMOXYTOL INJECTION 510 MG/17 ML
510.0000 mg | INTRAVENOUS | Status: DC
  Administered 2018-01-06: 510 mg via INTRAVENOUS
  Filled 2018-01-06: qty 510

## 2018-01-07 ENCOUNTER — Ambulatory Visit (HOSPITAL_COMMUNITY)
Admission: RE | Admit: 2018-01-07 | Discharge: 2018-01-07 | Disposition: A | Discharge status: Home / Self Care | Payer: Medicaid Other | Source: Ambulatory Visit | Attending: Vascular Surgery | Admitting: Vascular Surgery

## 2018-01-07 ENCOUNTER — Other Ambulatory Visit: Payer: Self-pay

## 2018-01-07 ENCOUNTER — Encounter: Payer: Self-pay | Admitting: Vascular Surgery

## 2018-01-07 ENCOUNTER — Ambulatory Visit (INDEPENDENT_AMBULATORY_CARE_PROVIDER_SITE_OTHER): Payer: Medicaid Other | Admitting: Vascular Surgery

## 2018-01-07 VITALS — BP 190/103 | HR 95 | Temp 97.6°F | Resp 20 | Ht 61.0 in | Wt 105.0 lb

## 2018-01-07 DIAGNOSIS — N185 Chronic kidney disease, stage 5: Secondary | ICD-10-CM

## 2018-01-07 NOTE — Progress Notes (Signed)
No further instructions

## 2018-01-07 NOTE — Progress Notes (Signed)
[facility] [place]  Patient name: Tara Bullock MRN: [id number] DOB: 04/30/1985 Sex: female  REASON FOR CONSULT: Discuss access for hemodialysis  HPI: Tara Bullock is a 33 y.o. female, who is here today for discussion of access for hemodialysis.  She is here today with her mother.  She currently has not initiated on hemodialysis but is felt that this is approaching and is here for discussion of access options.  She has never had hemodialysis.  She is right-handed.  She has multiple medical problems as outlined below with Tara Bullock onset diabetes.  Past Medical History:  Diagnosis Date  . Anorexia nervosa   . Anxiety   . Arthritis   . Depression   . Fibromyalgia   . GERD (gastroesophageal reflux disease)   . Headache   . History of hydronephrosis    left ureteral placement 04-02-2015  resolved  . Hypertension   . IBS (irritable bowel syndrome)   . Insomnia   . Neuromuscular disorder ([place])   . Pneumonia   . Renal cyst, left   . Retained ureteral stent    left  . Rheumatoid factor positive   . 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 Wears glasses     Family History  Problem Relation Age of Onset  . Hypertension Mother   . Hypertension Father     SOCIAL HISTORY: Social History   Socioeconomic History  . Marital status: Single    Spouse name: Not on file  . Number of children: Not on file  . Years of education: Not on file  . Highest education level: Not on file  Occupational History  . Not on file  Social Needs  . Financial resource strain: Not on file  . Food insecurity:    Worry: Not on file    Inability: Not on file  . Transportation needs:    Medical: Not on file    Non-medical: Not on file  Tobacco Use  . Smoking status: Former Smoker    Packs/day: 1.00    Years: 15.00    Pack years: 15.00    Types: Cigarettes    Last attempt to quit: 07/01/2016    Years since quitting: 1.5  . Smokeless tobacco: Never Used  Substance and Sexual Activity  . Alcohol use: No  . Drug use: No  . Sexual activity: Yes    Birth control/protection: I.U.D.    Comment: mirena iud placed Oct 2013  Lifestyle  . Physical activity:    Days per week: Not on file    Minutes per session: Not on file  . Stress: Not on file  Relationships  . Social connections:    Talks on phone: Not on file    Gets together: Not on file    Attends religious service: Not on file    Active member of club or organization: Not on file    Attends meetings of clubs or organizations: Not on file    Relationship status: Not on file  . Intimate partner violence:    Fear of current or ex partner: Not on file    Emotionally abused: Not on file    Physically abused: Not on file    Forced sexual activity: Not on file  Other Topics Concern  . Not on file  Social History Narrative  . Not on file    Allergies  Allergen Reactions  . Ivp Dye [Iodinated Diagnostic Agents] Anaphylaxis and Hives  . Lisinopril Cough  . Prednisone     Increases blood sugar  . Adhesive [Tape] Hives and Rash    Current Outpatient Medications  Medication Sig Dispense Refill  . ALPRAZolam (XANAX) 0.5 MG tablet Take 0.5 mg by mouth 3 (three) times daily as needed for sleep or anxiety.     . ARIPiprazole (ABILIFY) 2 MG tablet Take by mouth.    . calcitRIOL (ROCALTROL) 0.25 MCG capsule Take by mouth.    . cyclobenzaprine (FLEXERIL) 10 MG tablet Take by mouth.    . diltiazem (DILT-XR) 120 MG 24 hr capsule Take by mouth.    . doxepin (SINEQUAN) 25 MG capsule Take 25-75 mg by mouth at bedtime.    . DULoxetine (CYMBALTA) 60 MG capsule Take 120 mg by mouth daily.    . fluticasone (FLONASE ALLERGY RELIEF) 50 MCG/ACT nasal spray Place 1 spray into both nostrils daily. (Patient taking differently: Place 1 spray into both  nostrils daily as needed for allergies. ) 16 g 2  . furosemide (LASIX) 40 MG tablet Take by mouth.    . gabapentin (NEURONTIN) 300 MG capsule Take 600 mg by mouth 3 (three) times daily.   1  . hyoscyamine (LEVSIN SL) 0.125 MG SL tablet Place 0.125 mg under the tongue daily as needed for cramping.    . insulin aspart (NOVOLOG) 100 UNIT/ML injection Inject 1-7 Units into the skin 3 (three) times daily with meals.    . lamoTRIgine (LAMICTAL) 200 MG tablet Take 200 mg by mouth at bedtime.    Marland Kitchen LEVEMIR FLEXTOUCH 100 UNIT/ML Pen Inject 18 Units into the skin every morning.   5  . levonorgestrel (MIRENA) 20 MCG/24HR IUD 1 each by Intrauterine route once.     Marland Kitchen losartan (COZAAR) 100 MG tablet Take 100 mg by mouth daily.    . metaxalone (SKELAXIN) 800 MG tablet Take 800 mg by mouth 3 (three) times daily as needed for muscle spasms.    . Multiple Vitamin (MULTIVITAMIN WITH MINERALS) TABS tablet Take 2 tablets by mouth daily.    Marland Kitchen NIFEdipine (ADALAT CC) 60 MG 24 hr tablet Take by mouth.    . promethazine (PHENERGAN) 25 MG tablet Take 25 mg by mouth 3 (three) times daily as needed for nausea or vomiting.    . traMADol (ULTRAM) 50 MG tablet Take 100 mg by mouth 3 (three) times daily.   0   No current facility-administered medications for this visit.     REVIEW OF SYSTEMS:  [X]  denotes positive finding, [ ]  denotes negative finding Cardiac  Comments:  Chest pain or chest pressure:    Shortness of breath upon exertion:    Short of breath when lying flat:    Irregular heart rhythm:        Vascular    Pain in calf, thigh, or hip brought on by ambulation:    Pain in feet at night that wakes you up from your sleep:     Blood clot in your veins:    Leg swelling:         Pulmonary    Oxygen at home:    Productive cough:     Wheezing:         Neurologic    Sudden weakness in arms or legs:     Sudden numbness in arms or legs:     Sudden onset of difficulty speaking or slurred speech:  Temporary  loss of vision in one eye:     Problems with dizziness:         Gastrointestinal    Blood in stool:     Vomited blood:         Genitourinary    Burning when urinating:     Blood in urine:        Psychiatric    Major depression:         Hematologic    Bleeding problems:    Problems with blood clotting too easily:        Skin    Rashes or ulcers:        Constitutional    Fever or chills:      PHYSICAL EXAM: Vitals:   01/07/18 1041  BP: (!) 190/103  Pulse: 95  Resp: 20  Temp: 97.6 F (36.4 C)  SpO2: 100%  Weight: 105 lb (47.6 kg)  Height: 5\' 1"  (1.549 m)    GENERAL: The patient is a well-nourished female, in no acute distress. The vital signs are documented above. CARDIOVASCULAR: 2+ radial pulses bilaterally.  Small surface veins by physical exam bilaterally PULMONARY: There is good air exchange  ABDOMEN: Soft and non-tender  MUSCULOSKELETAL: There are no major deformities or cyanosis. NEUROLOGIC: No focal weakness or paresthesias are detected. SKIN: There are no ulcers or rashes noted. PSYCHIATRIC: The patient has a normal affect.  DATA:  Noninvasive studies of her upper extremities revealed normal triphasic waveforms at the level of the wrist bilaterally.  She does have moderate size antecubital veins.  Forearm cephalic veins are small and upper arm cephalic and basilic veins are moderate sized.  I imaged her veins with SonoSite and the left upper arm appears to be better than the right upper arm cephalic vein  MEDICAL ISSUES: I had an extremely long discussion with the patient and her mother present.  Her mother is very involved in her care and is taking notes and asking a multiple questions regarding treatment options.  I discussed the option of catheter placement for acute hemodialysis and the limitations and concern regarding infection.  Also discussed AV fistula and AV graft.  I do feel she is a candidate for a left upper arm AV fistula creation.  I explained  there is certainly a risk that this would not mature adequately.  If this fails I would recommend placement of a prosthetic graft.  I described this as an outpatient procedure.  Understand the procedure and wished to proceed as soon as possibly.  We have scheduled her surgery for January 17, 2018.  Patient was very emotional during our discussion and is understandably upset regarding potential to progression to end-stage renal disease.   [name], MD FACS [facility] Tel [phone] Pager [phone]

## 2018-01-07 NOTE — H&P (View-Only) (Signed)
[facility] of [place]  Patient name: Tara Bullock MRN: [id number] DOB: 1985/06/03 Sex: female  REASON FOR CONSULT: Discuss access for hemodialysis  HPI: Tara Bullock is a 33 y.o. female, who is here today for discussion of access for hemodialysis.  She is here today with her mother.  She currently has not initiated on hemodialysis but is felt that this is approaching and is here for discussion of access options.  She has never had hemodialysis.  She is right-handed.  She has multiple medical problems as outlined below with early onset diabetes.  Past Medical History:  Diagnosis Date  . Anorexia nervosa   . Anxiety   . Arthritis   . Depression   . Fibromyalgia   . GERD (gastroesophageal reflux disease)   . Headache   . History of hydronephrosis    left ureteral placement 04-02-2015  resolved  . Hypertension   . IBS (irritable bowel syndrome)   . Insomnia   . Neuromuscular disorder ([place])   . Pneumonia   . Renal cyst, left   . Retained ureteral stent    left  . Rheumatoid factor positive   . 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 Wears glasses     Family History  Problem Relation Age of Onset  . Hypertension Mother   . Hypertension Father     SOCIAL HISTORY: Social History   Socioeconomic History  . Marital status: Single    Spouse name: Not on file  . Number of children: Not on file  . Years of education: Not on file  . Highest education level: Not on file  Occupational History  . Not on file  Social Needs  . Financial resource strain: Not on file  . Food insecurity:    Worry: Not on file    Inability: Not on file  . Transportation needs:    Medical: Not on file    Non-medical: Not on file  Tobacco Use  . Smoking status: Former Smoker    Packs/day: 1.00    Years: 15.00    Pack years: 15.00    Types: Cigarettes    Last attempt to quit: 07/01/2016    Years since quitting: 1.5  . Smokeless tobacco: Never Used  Substance and Sexual Activity  . Alcohol use: No  . Drug use: No  . Sexual activity: Yes    Birth control/protection: I.U.D.    Comment: mirena iud placed Oct 2013  Lifestyle  . Physical activity:    Days per week: Not on file    Minutes per session: Not on file  . Stress: Not on file  Relationships  . Social connections:    Talks on phone: Not on file    Gets together: Not on file    Attends religious service: Not on file    Active member of club or organization: Not on file    Attends meetings of clubs or organizations: Not on file    Relationship status: Not on file  . Intimate partner violence:    Fear of current or ex partner: Not on file    Emotionally abused: Not on file    Physically abused: Not on file    Forced sexual activity: Not on file  Other Topics Concern  . Not on file  Social History Narrative  . Not on file    Allergies  Allergen Reactions  . Ivp Dye [Iodinated Diagnostic Agents] Anaphylaxis and Hives  . Lisinopril Cough  . Prednisone     Increases blood sugar  . Adhesive [Tape] Hives and Rash    Current Outpatient Medications  Medication Sig Dispense Refill  . ALPRAZolam (XANAX) 0.5 MG tablet Take 0.5 mg by mouth 3 (three) times daily as needed for sleep or anxiety.     . ARIPiprazole (ABILIFY) 2 MG tablet Take by mouth.    . calcitRIOL (ROCALTROL) 0.25 MCG capsule Take by mouth.    . cyclobenzaprine (FLEXERIL) 10 MG tablet Take by mouth.    . diltiazem (DILT-XR) 120 MG 24 hr capsule Take by mouth.    . doxepin (SINEQUAN) 25 MG capsule Take 25-75 mg by mouth at bedtime.    . DULoxetine (CYMBALTA) 60 MG capsule Take 120 mg by mouth daily.    . fluticasone (FLONASE ALLERGY RELIEF) 50 MCG/ACT nasal spray Place 1 spray into both nostrils daily. (Patient taking differently: Place 1 spray into both  nostrils daily as needed for allergies. ) 16 g 2  . furosemide (LASIX) 40 MG tablet Take by mouth.    . gabapentin (NEURONTIN) 300 MG capsule Take 600 mg by mouth 3 (three) times daily.   1  . hyoscyamine (LEVSIN SL) 0.125 MG SL tablet Place 0.125 mg under the tongue daily as needed for cramping.    . insulin aspart (NOVOLOG) 100 UNIT/ML injection Inject 1-7 Units into the skin 3 (three) times daily with meals.    . lamoTRIgine (LAMICTAL) 200 MG tablet Take 200 mg by mouth at bedtime.    Marland Kitchen LEVEMIR FLEXTOUCH 100 UNIT/ML Pen Inject 18 Units into the skin every morning.   5  . levonorgestrel (MIRENA) 20 MCG/24HR IUD 1 each by Intrauterine route once.     Marland Kitchen losartan (COZAAR) 100 MG tablet Take 100 mg by mouth daily.    . metaxalone (SKELAXIN) 800 MG tablet Take 800 mg by mouth 3 (three) times daily as needed for muscle spasms.    . Multiple Vitamin (MULTIVITAMIN WITH MINERALS) TABS tablet Take 2 tablets by mouth daily.    Marland Kitchen NIFEdipine (ADALAT CC) 60 MG 24 hr tablet Take by mouth.    . promethazine (PHENERGAN) 25 MG tablet Take 25 mg by mouth 3 (three) times daily as needed for nausea or vomiting.    . traMADol (ULTRAM) 50 MG tablet Take 100 mg by mouth 3 (three) times daily.   0   No current facility-administered medications for this visit.     REVIEW OF SYSTEMS:  [X]  denotes positive finding, [ ]  denotes negative finding Cardiac  Comments:  Chest pain or chest pressure:    Shortness of breath upon exertion:    Short of breath when lying flat:    Irregular heart rhythm:        Vascular    Pain in calf, thigh, or hip brought on by ambulation:    Pain in feet at night that wakes you up from your sleep:     Blood clot in your veins:    Leg swelling:         Pulmonary    Oxygen at home:    Productive cough:     Wheezing:         Neurologic    Sudden weakness in arms or legs:     Sudden numbness in arms or legs:     Sudden onset of difficulty speaking or slurred speech:  Temporary  loss of vision in one eye:     Problems with dizziness:         Gastrointestinal    Blood in stool:     Vomited blood:         Genitourinary    Burning when urinating:     Blood in urine:        Psychiatric    Major depression:         Hematologic    Bleeding problems:    Problems with blood clotting too easily:        Skin    Rashes or ulcers:        Constitutional    Fever or chills:      PHYSICAL EXAM: Vitals:   01/07/18 1041  BP: (!) 190/103  Pulse: 95  Resp: 20  Temp: 97.6 F (36.4 C)  SpO2: 100%  Weight: 105 lb (47.6 kg)  Height: 5\' 1"  (1.549 m)    GENERAL: The patient is a well-nourished female, in no acute distress. The vital signs are documented above. CARDIOVASCULAR: 2+ radial pulses bilaterally.  Small surface veins by physical exam bilaterally PULMONARY: There is good air exchange  ABDOMEN: Soft and non-tender  MUSCULOSKELETAL: There are no major deformities or cyanosis. NEUROLOGIC: No focal weakness or paresthesias are detected. SKIN: There are no ulcers or rashes noted. PSYCHIATRIC: The patient has a normal affect.  DATA:  Noninvasive studies of her upper extremities revealed normal triphasic waveforms at the level of the wrist bilaterally.  She does have moderate size antecubital veins.  Forearm cephalic veins are small and upper arm cephalic and basilic veins are moderate sized.  I imaged her veins with SonoSite and the left upper arm appears to be better than the right upper arm cephalic vein  MEDICAL ISSUES: I had an extremely long discussion with the patient and her mother present.  Her mother is very involved in her care and is taking notes and asking a multiple questions regarding treatment options.  I discussed the option of catheter placement for acute hemodialysis and the limitations and concern regarding infection.  Also discussed AV fistula and AV graft.  I do feel she is a candidate for a left upper arm AV fistula creation.  I explained  there is certainly a risk that this would not mature adequately.  If this fails I would recommend placement of a prosthetic graft.  I described this as an outpatient procedure.  Understand the procedure and wished to proceed as soon as possibly.  We have scheduled her surgery for January 17, 2018.  Patient was very emotional during our discussion and is understandably upset regarding potential to progression to end-stage renal disease.   [name], MD FACS [facility] Tel [phone] Pager [phone]

## 2018-01-16 ENCOUNTER — Encounter (HOSPITAL_COMMUNITY): Payer: Self-pay | Admitting: *Deleted

## 2018-01-16 ENCOUNTER — Other Ambulatory Visit: Payer: Self-pay

## 2018-01-16 NOTE — Anesthesia Preprocedure Evaluation (Signed)
Anesthesia Evaluation  Patient identified by MRN, date of birth, ID band Patient awake    Reviewed: Allergy & Precautions, NPO status , Patient's Chart, lab work & pertinent test results  Airway Mallampati: II  TM Distance: >3 FB Neck ROM: Full    Dental  (+) Teeth Intact, Dental Advisory Given   Pulmonary Current Smoker, former smoker,    breath sounds clear to auscultation       Cardiovascular hypertension,  Rhythm:Regular Rate:Normal     Neuro/Psych  Headaches, PSYCHIATRIC DISORDERS Anxiety Depression  Neuromuscular disease    GI/Hepatic Neg liver ROS, GERD  Controlled and Medicated,  Endo/Other  diabetes, Poorly Controlled, Type 1, Insulin Dependent  Renal/GU Renal InsufficiencyRenal disease  negative genitourinary   Musculoskeletal  (+) Arthritis , Osteoarthritis,  Fibromyalgia -  Abdominal   Peds negative pediatric ROS (+)  Hematology  (+) Blood dyscrasia, anemia ,   Anesthesia Other Findings Day of surgery medications reviewed with the patient.  Reproductive/Obstetrics                             Anesthesia Physical  Anesthesia Plan  ASA: III  Anesthesia Plan: MAC   Post-op Pain Management:    Induction: Intravenous  PONV Risk Score and Plan: 3 and Propofol infusion and Treatment may vary due to age or medical condition  Airway Management Planned: Natural Airway and Nasal Cannula  Additional Equipment:   Intra-op Plan:   Post-operative Plan:   Informed Consent: I have reviewed the patients History and Physical, chart, labs and discussed the procedure including the risks, benefits and alternatives for the proposed anesthesia with the patient or authorized representative who has indicated his/her understanding and acceptance.       Plan Discussed with: CRNA, Anesthesiologist and Surgeon  Anesthesia Plan Comments: (P)        Anesthesia Quick Evaluation

## 2018-01-16 NOTE — Progress Notes (Signed)
Spoke with pt for pre-op call. Pt denies cardiac history. Pt is a type 1 diabetic. Last A1C was 9.8 on 12/06/17. She states her fasting blood sugar is usually around 160. Pt instructed to check her blood sugar when she gets up in the AM. Instructed her to take 80% of her regular dose of Levemir Insulin in the AM if blood sugar is greater than 70. If blood sugar is >220 take 1/2 of usual correction dose of Novolog insulin. If blood sugar is 70 or below, treat with 1/2 cup of clear juice (apple or cranberry) and recheck blood sugar 15 minutes after drinking juice. Pt voiced understanding.

## 2018-01-17 ENCOUNTER — Encounter (HOSPITAL_COMMUNITY)
Admission: RE | Disposition: A | Discharge status: Home / Self Care | Payer: Self-pay | Source: Home / Self Care | Attending: Vascular Surgery

## 2018-01-17 ENCOUNTER — Encounter (HOSPITAL_COMMUNITY): Payer: Self-pay | Admitting: *Deleted

## 2018-01-17 ENCOUNTER — Ambulatory Visit (HOSPITAL_COMMUNITY)
Admission: RE | Admit: 2018-01-17 | Discharge: 2018-01-17 | Disposition: A | Discharge status: Home / Self Care | Payer: Medicaid Other | Attending: Vascular Surgery | Admitting: Vascular Surgery

## 2018-01-17 ENCOUNTER — Ambulatory Visit (HOSPITAL_COMMUNITY): Payer: Medicaid Other | Admitting: Anesthesiology

## 2018-01-17 ENCOUNTER — Telehealth: Payer: Self-pay | Admitting: Vascular Surgery

## 2018-01-17 DIAGNOSIS — K219 Gastro-esophageal reflux disease without esophagitis: Secondary | ICD-10-CM | POA: Insufficient documentation

## 2018-01-17 DIAGNOSIS — Z793 Long term (current) use of hormonal contraceptives: Secondary | ICD-10-CM | POA: Insufficient documentation

## 2018-01-17 DIAGNOSIS — Z7951 Long term (current) use of inhaled steroids: Secondary | ICD-10-CM | POA: Insufficient documentation

## 2018-01-17 DIAGNOSIS — N184 Chronic kidney disease, stage 4 (severe): Secondary | ICD-10-CM | POA: Insufficient documentation

## 2018-01-17 DIAGNOSIS — D631 Anemia in chronic kidney disease: Secondary | ICD-10-CM | POA: Diagnosis not present

## 2018-01-17 DIAGNOSIS — Z79899 Other long term (current) drug therapy: Secondary | ICD-10-CM | POA: Insufficient documentation

## 2018-01-17 DIAGNOSIS — N185 Chronic kidney disease, stage 5: Secondary | ICD-10-CM

## 2018-01-17 DIAGNOSIS — E1022 Type 1 diabetes mellitus with diabetic chronic kidney disease: Secondary | ICD-10-CM | POA: Insufficient documentation

## 2018-01-17 DIAGNOSIS — F419 Anxiety disorder, unspecified: Secondary | ICD-10-CM | POA: Insufficient documentation

## 2018-01-17 DIAGNOSIS — M797 Fibromyalgia: Secondary | ICD-10-CM | POA: Insufficient documentation

## 2018-01-17 DIAGNOSIS — E1065 Type 1 diabetes mellitus with hyperglycemia: Secondary | ICD-10-CM | POA: Insufficient documentation

## 2018-01-17 DIAGNOSIS — I129 Hypertensive chronic kidney disease with stage 1 through stage 4 chronic kidney disease, or unspecified chronic kidney disease: Secondary | ICD-10-CM | POA: Insufficient documentation

## 2018-01-17 DIAGNOSIS — G4700 Insomnia, unspecified: Secondary | ICD-10-CM | POA: Insufficient documentation

## 2018-01-17 DIAGNOSIS — Z794 Long term (current) use of insulin: Secondary | ICD-10-CM | POA: Insufficient documentation

## 2018-01-17 DIAGNOSIS — F329 Major depressive disorder, single episode, unspecified: Secondary | ICD-10-CM | POA: Insufficient documentation

## 2018-01-17 HISTORY — DX: Personal history of urinary calculi: Z87.442

## 2018-01-17 HISTORY — DX: Anemia, unspecified: D64.9

## 2018-01-17 HISTORY — PX: AV FISTULA PLACEMENT: SHX1204

## 2018-01-17 HISTORY — DX: Chronic kidney disease, unspecified: N18.9

## 2018-01-17 LAB — POCT I-STAT 4, (NA,K, GLUC, HGB,HCT)
Glucose, Bld: 58 mg/dL — ABNORMAL LOW (ref 70–99)
HCT: 37 % (ref 36.0–46.0)
Hemoglobin: 12.6 g/dL (ref 12.0–15.0)
POTASSIUM: 3.6 mmol/L (ref 3.5–5.1)
Sodium: 141 mmol/L (ref 135–145)

## 2018-01-17 LAB — GLUCOSE, CAPILLARY
GLUCOSE-CAPILLARY: 51 mg/dL — AB (ref 70–99)
GLUCOSE-CAPILLARY: 78 mg/dL (ref 70–99)
Glucose-Capillary: 140 mg/dL — ABNORMAL HIGH (ref 70–99)
Glucose-Capillary: 97 mg/dL (ref 70–99)

## 2018-01-17 LAB — POCT PREGNANCY, URINE: Preg Test, Ur: NEGATIVE

## 2018-01-17 SURGERY — ARTERIOVENOUS (AV) FISTULA CREATION
Anesthesia: Monitor Anesthesia Care | Site: Arm Upper | Laterality: Left

## 2018-01-17 MED ORDER — ONDANSETRON HCL 4 MG/2ML IJ SOLN
INTRAMUSCULAR | Status: AC
  Filled 2018-01-17: qty 2

## 2018-01-17 MED ORDER — LIDOCAINE-EPINEPHRINE 0.5 %-1:200000 IJ SOLN
INTRAMUSCULAR | Status: AC
  Filled 2018-01-17: qty 1

## 2018-01-17 MED ORDER — SODIUM CHLORIDE 0.9 % IV SOLN
INTRAVENOUS | Status: AC
  Filled 2018-01-17: qty 1.2

## 2018-01-17 MED ORDER — ONDANSETRON HCL 4 MG/2ML IJ SOLN
INTRAMUSCULAR | Status: DC | PRN
  Administered 2018-01-17: 4 mg via INTRAVENOUS

## 2018-01-17 MED ORDER — CHLORHEXIDINE GLUCONATE 4 % EX LIQD: 1.0000 "application " | Freq: Once | CUTANEOUS | Status: DC

## 2018-01-17 MED ORDER — CEFAZOLIN SODIUM-DEXTROSE 2-4 GM/100ML-% IV SOLN
2.0000 g | INTRAVENOUS | Status: AC
  Administered 2018-01-17: 2 g via INTRAVENOUS
  Filled 2018-01-17: qty 100

## 2018-01-17 MED ORDER — 0.9 % SODIUM CHLORIDE (POUR BTL) OPTIME
TOPICAL | Status: DC | PRN
  Administered 2018-01-17: 1000 mL

## 2018-01-17 MED ORDER — SODIUM CHLORIDE 0.9 % IV SOLN
INTRAVENOUS | Status: DC
  Administered 2018-01-17 (×2): via INTRAVENOUS

## 2018-01-17 MED ORDER — DEXTROSE 50 % IV SOLN
INTRAVENOUS | Status: AC
  Administered 2018-01-17: 25 mL via INTRAVENOUS
  Filled 2018-01-17: qty 50

## 2018-01-17 MED ORDER — FENTANYL CITRATE (PF) 100 MCG/2ML IJ SOLN
INTRAMUSCULAR | Status: DC | PRN
  Administered 2018-01-17 (×2): 50 ug via INTRAVENOUS

## 2018-01-17 MED ORDER — MIDAZOLAM HCL 5 MG/5ML IJ SOLN
INTRAMUSCULAR | Status: DC | PRN
  Administered 2018-01-17 (×2): 1 mg via INTRAVENOUS

## 2018-01-17 MED ORDER — PROPOFOL 10 MG/ML IV BOLUS
INTRAVENOUS | Status: AC
  Filled 2018-01-17: qty 40

## 2018-01-17 MED ORDER — PROPOFOL 500 MG/50ML IV EMUL
INTRAVENOUS | Status: DC | PRN
  Administered 2018-01-17: 50 ug/kg/min via INTRAVENOUS

## 2018-01-17 MED ORDER — SODIUM CHLORIDE 0.9 % IV SOLN
INTRAVENOUS | Status: DC | PRN
  Administered 2018-01-17: 07:00:00

## 2018-01-17 MED ORDER — LIDOCAINE 2% (20 MG/ML) 5 ML SYRINGE
INTRAMUSCULAR | Status: AC
  Filled 2018-01-17: qty 5

## 2018-01-17 MED ORDER — LIDOCAINE-EPINEPHRINE 0.5 %-1:200000 IJ SOLN
INTRAMUSCULAR | Status: DC | PRN
  Administered 2018-01-17: 50 mL

## 2018-01-17 MED ORDER — MIDAZOLAM HCL 2 MG/2ML IJ SOLN
INTRAMUSCULAR | Status: AC
  Filled 2018-01-17: qty 2

## 2018-01-17 MED ORDER — DEXTROSE 50 % IV SOLN
25.0000 mL | Freq: Once | INTRAVENOUS | Status: AC
  Administered 2018-01-17: 25 mL via INTRAVENOUS

## 2018-01-17 MED ORDER — FENTANYL CITRATE (PF) 250 MCG/5ML IJ SOLN
INTRAMUSCULAR | Status: AC
  Filled 2018-01-17: qty 5

## 2018-01-17 MED ORDER — LIDOCAINE HCL (CARDIAC) PF 100 MG/5ML IV SOSY
PREFILLED_SYRINGE | INTRAVENOUS | Status: DC | PRN
  Administered 2018-01-17: 40 mg via INTRATRACHEAL

## 2018-01-17 SURGICAL SUPPLY — 28 items
ARMBAND PINK RESTRICT EXTREMIT (MISCELLANEOUS) ×6 IMPLANT
CANISTER SUCT 3000ML PPV (MISCELLANEOUS) ×3 IMPLANT
CANNULA VESSEL 3MM 2 BLNT TIP (CANNULA) ×3 IMPLANT
CLIP LIGATING EXTRA MED SLVR (CLIP) ×3 IMPLANT
CLIP LIGATING EXTRA SM BLUE (MISCELLANEOUS) ×3 IMPLANT
COVER PROBE W GEL 5X96 (DRAPES) ×3 IMPLANT
COVER WAND RF STERILE (DRAPES) ×3 IMPLANT
DECANTER SPIKE VIAL GLASS SM (MISCELLANEOUS) ×3 IMPLANT
DERMABOND ADVANCED (GAUZE/BANDAGES/DRESSINGS) ×2
DERMABOND ADVANCED .7 DNX12 (GAUZE/BANDAGES/DRESSINGS) ×1 IMPLANT
ELECT REM PT RETURN 9FT ADLT (ELECTROSURGICAL) ×3
ELECTRODE REM PT RTRN 9FT ADLT (ELECTROSURGICAL) ×1 IMPLANT
GLOVE SS BIOGEL STRL SZ 7.5 (GLOVE) ×1 IMPLANT
GLOVE SUPERSENSE BIOGEL SZ 7.5 (GLOVE) ×2
GLOVE SURG SS PI 7.0 STRL IVOR (GLOVE) ×2 IMPLANT
GOWN STRL REUS W/ TWL LRG LVL3 (GOWN DISPOSABLE) ×3 IMPLANT
GOWN STRL REUS W/TWL LRG LVL3 (GOWN DISPOSABLE) ×6
KIT BASIN OR (CUSTOM PROCEDURE TRAY) ×3 IMPLANT
KIT TURNOVER KIT B (KITS) ×3 IMPLANT
NS IRRIG 1000ML POUR BTL (IV SOLUTION) ×3 IMPLANT
PACK CV ACCESS (CUSTOM PROCEDURE TRAY) ×3 IMPLANT
PAD ARMBOARD 7.5X6 YLW CONV (MISCELLANEOUS) ×6 IMPLANT
SUT PROLENE 6 0 CC (SUTURE) ×9 IMPLANT
SUT VIC AB 3-0 SH 27 (SUTURE) ×2
SUT VIC AB 3-0 SH 27X BRD (SUTURE) ×1 IMPLANT
TOWEL GREEN STERILE (TOWEL DISPOSABLE) ×3 IMPLANT
UNDERPAD 30X30 (UNDERPADS AND DIAPERS) ×3 IMPLANT
WATER STERILE IRR 1000ML POUR (IV SOLUTION) ×3 IMPLANT

## 2018-01-17 NOTE — Discharge Instructions (Signed)
° °  [facility]  Discharge Instructions  AV Fistula or Graft Surgery for Dialysis Access  Please refer to the following instructions for your post-procedure care. Your surgeon or physician assistant will discuss any changes with you.  Activity  You may drive the day following your surgery, if you are comfortable and no longer taking prescription pain medication. Resume full activity as the soreness in your incision resolves.  Bathing/Showering  You may shower after you go home. Keep your incision dry for 48 hours. Do not soak in a bathtub, hot tub, or swim until the incision heals completely. You may not shower if you have a hemodialysis catheter.  Incision Care  Clean your incision with mild soap and water after 48 hours. Pat the area dry with a clean towel. You do not need a bandage unless otherwise instructed. Do not apply any ointments or creams to your incision. You may have skin glue on your incision. Do not peel it off. It will come off on its own in about one week. Your arm may swell a bit after surgery. To reduce swelling use pillows to elevate your arm so it is above your heart. Your doctor will tell you if you need to lightly wrap your arm with an ACE bandage.  Diet  Resume your normal diet. There are not special food restrictions following this procedure. In order to heal from your surgery, it is CRITICAL to get adequate nutrition. Your body requires vitamins, minerals, and protein. Vegetables are the best source of vitamins and minerals. Vegetables also provide the perfect balance of protein. Processed food has little nutritional value, so try to avoid this.  Medications  Resume taking all of your medications. If your incision is causing pain, you may take over-the counter pain relievers such as acetaminophen (Tylenol). If you were prescribed a stronger pain medication, please be aware these medications can cause nausea and constipation. Prevent  nausea by taking the medication with a snack or meal. Avoid constipation by drinking plenty of fluids and eating foods with high amount of fiber, such as fruits, vegetables, and grains.  Do not take Tylenol if you are taking prescription pain medications.  Follow up Your surgeon may want to see you in the office following your access surgery. If so, this will be arranged at the time of your surgery.  Please call us immediately for any of the following conditions:  Increased pain, redness, drainage (pus) from your incision site Fever of 101 degrees or higher Severe or worsening pain at your incision site Hand pain or numbness.  Reduce your risk of vascular disease:  Stop smoking. If you would like help, call [facility] at 1-800-QUIT-NOW 310 828 0181) or [place] at [place] your cholesterol Maintain a desired weight Control your diabetes Keep your blood pressure down  Dialysis  It will take several weeks to several months for your new dialysis access to be ready for use. Your surgeon will determine when it is okay to use it. Your nephrologist will continue to direct your dialysis. You can continue to use your Permcath until your new access is ready for use.   01/17/2018 Tara Bullock [id number] 17-Mar-1985  Surgeon(s): Psalm Arman, Arvilla Meres, MD  Procedure(s): ARTERIOVENOUS (AV) FISTULA CREATION   May stick graft immediately   May stick graft on designated area only:   x Do not stick fistula for 12 weeks    If you have any questions, please call the office at [phone].

## 2018-01-17 NOTE — Interval H&P Note (Signed)
History and Physical Interval Note:  01/17/2018 7:24 AM  Tara Bullock  has presented today for surgery, with the diagnosis of CHRONIC KIDNEY DISEASE STAGE V  The various methods of treatment have been discussed with the patient and family. After consideration of risks, benefits and other options for treatment, the patient has consented to  Procedure(s): ARTERIOVENOUS (AV) FISTULA CREATION (Left) as a surgical intervention .  The patient's history has been reviewed, patient examined, no change in status, stable for surgery.  I have reviewed the patient's chart and labs.  Questions were answered to the patient's satisfaction.     Curt Jews

## 2018-01-17 NOTE — Telephone Encounter (Signed)
sch appt spk to pt mld ltr 02/25/2018 3pm Dialysis duplex 345pm p/o NP

## 2018-01-17 NOTE — Transfer of Care (Signed)
Immediate Anesthesia Transfer of Care Note  Patient: Tara Bullock  Procedure(s) Performed: ARTERIOVENOUS (AV) FISTULA CREATION (Left Arm Upper)  Patient Location: PACU  Anesthesia Type:MAC  Level of Consciousness: awake, alert , oriented and sedated  Airway & Oxygen Therapy: Patient Spontanous Breathing and Patient connected to nasal cannula oxygen  Post-op Assessment: Report given to RN, Post -op Vital signs reviewed and stable and Patient moving all extremities  Post vital signs: Reviewed and stable  Last Vitals:  Vitals Value Taken Time  BP 107/66 01/17/2018  9:03 AM  Temp    Pulse 86 01/17/2018  9:11 AM  Resp 14 01/17/2018  9:11 AM  SpO2 95 % 01/17/2018  9:11 AM  Vitals shown include unvalidated device data.  Last Pain:  Vitals:   01/17/18 0607  TempSrc:   PainSc: 0-No pain      Patients Stated Pain Goal: 3 (68/25/74 9355)  Complications: No apparent anesthesia complications

## 2018-01-17 NOTE — Telephone Encounter (Signed)
-----   Message from [name], MD sent at 01/17/2018  9:11 AM EST -----  Left upper arm AV fistula creation assistant Liana Crocker.  Follow-up in [facility] in 6 weeks.  On Tuesday if possible with duplex

## 2018-01-17 NOTE — Anesthesia Postprocedure Evaluation (Signed)
Anesthesia Post Note  Patient: Tara Bullock  Procedure(s) Performed: ARTERIOVENOUS (AV) FISTULA CREATION (Left Arm Upper)     Patient location during evaluation: PACU Anesthesia Type: MAC Level of consciousness: awake and alert Pain management: pain level controlled Vital Signs Assessment: post-procedure vital signs reviewed and stable Respiratory status: spontaneous breathing, nonlabored ventilation, respiratory function stable and patient connected to nasal cannula oxygen Cardiovascular status: stable and blood pressure returned to baseline Postop Assessment: no apparent nausea or vomiting Anesthetic complications: no    Last Vitals:  Vitals:   01/17/18 0904 01/17/18 0909  BP:    Pulse:    Resp:  18  Temp: 36.7 C   SpO2:      Last Pain:  Vitals:   01/17/18 0904  TempSrc:   PainSc: 0-No pain                 Gareld Obrecht

## 2018-01-17 NOTE — Op Note (Signed)
    OPERATIVE REPORT  DATE OF SURGERY: 01/17/2018  PATIENT: Tara Bullock, 33 y.o. female MRN: [id number]  DOB: 1985/10/07  PRE-OPERATIVE DIAGNOSIS: Chronic renal insufficiency  POST-OPERATIVE DIAGNOSIS:  Same  PROCEDURE: Left brachiocephalic AV fistula  SURGEON:  Curt Jews, M.D.  PHYSICIAN ASSISTANT: Liana Crocker, PA-C  ANESTHESIA: Local with sedation  EBL: per anesthesia record  Total I/O In: 400 [I.V.:400] Out: 10 [Blood:10]  BLOOD ADMINISTERED: none  DRAINS: none  SPECIMEN: none  COUNTS CORRECT:  YES  PATIENT DISPOSITION:  PACU - hemodynamically stable  PROCEDURE DETAILS: Patient was taken to the operative placed supine position where the area of the left arm was prepped draped in sterile fashion.  SonoSite ultrasound was used to visualize the cephalic vein.  Moderate size cephalic vein at the antecubital space.  Using local anesthesia incision was made over the antecubital space.  The patient did have a good size cephalic vein at the antecubital space.  The basilic vein and cephalic vein had a communication at the antecubital space.  This was ligated and divided.    The basilic vein was left intact and would be available for future fistula creation if indicated.  The cephalic vein was ligated distally and was brought into approximation with the brachial artery.  The patient had a moderate-sized brachial artery.  The artery was occluded proximally distally and was opened with an 11 blade and sent longitudinally with Potts scissors.  The vein was sewn end-to-side to the artery with a running 6-0 Prolene suture.  Clamps removed and excellent thrill was noted.  The wounds were irrigated with saline.  Hemostasis obtained after cautery.  The wounds were closed with 3-0 Vicryl in the subcutaneous and subcuticular tissue.  Sterile dressing was applied and the patient was transferred to the recovery room in stable condition   [name], M.D.,  [facility] 01/17/2018 10:09 AM

## 2018-01-17 NOTE — Progress Notes (Signed)
Hypoglycemic Event  CBG: 51  Treatment: D50 25 mL (12.5 gm)  Symptoms: Pale and Dizzy  Follow-up CBG: [id number] CBG Result:140  Possible Reasons for Event: Inadequate meal intake  Comments/MD notified:Dr. Theodis Blaze, Jaynie Bream  NPO for surgery.  This AM, CBG at home 73, Pt took--1 unit of Novolog, and 14 units of Levemir.

## 2018-01-18 ENCOUNTER — Encounter (HOSPITAL_COMMUNITY): Payer: Self-pay | Admitting: Vascular Surgery

## 2018-01-27 ENCOUNTER — Other Ambulatory Visit: Payer: Self-pay

## 2018-01-27 DIAGNOSIS — N185 Chronic kidney disease, stage 5: Secondary | ICD-10-CM

## 2018-02-03 ENCOUNTER — Encounter (HOSPITAL_COMMUNITY): Payer: Medicaid Other

## 2018-02-18 ENCOUNTER — Telehealth: Payer: Self-pay

## 2018-02-18 NOTE — Telephone Encounter (Signed)
Pt. Called with concerns of AVF site "opening up" and feels it may be infected. It is "oozing a green color" and has been reddish since surgery and is no more red now than it has been. It is a little painful now than it has been. Denies fever and has not felt it to see if it is warm. She has been scheduled for a wound check on Thursday morning and will call us if anything changes in the mean time.

## 2018-02-19 NOTE — Progress Notes (Signed)
POST OPERATIVE OFFICE NOTE    CC:  F/u for surgery  HPI:  This is a 33 y.o. female who is s/p left BC AVF by Dr. Donnetta Hutching on 01/17/2018 who returns today for follow up.  She is here with her mom.  She is concerned about infection at her incision.  She states it started turning red about 6 days ago.  She states it has been draining. She denies any fevers.   Allergies  Allergen Reactions  . Ivp Dye [Iodinated Diagnostic Agents] Anaphylaxis and Hives  . Lisinopril Cough  . Prednisone     Increases blood sugar  . Adhesive [Tape] Hives and Rash    Current Outpatient Medications  Medication Sig Dispense Refill  . ALPRAZolam (XANAX) 0.5 MG tablet Take 0.5 mg by mouth 3 (three) times daily as needed for sleep or anxiety.     . ARIPiprazole (ABILIFY) 5 MG tablet Take 5 mg by mouth daily.     . calcitRIOL (ROCALTROL) 0.25 MCG capsule Take 0.25 mcg by mouth daily.     . cyclobenzaprine (FLEXERIL) 10 MG tablet Take 10 mg by mouth 3 (three) times daily as needed for muscle spasms.     Marland Kitchen diltiazem (DILT-XR) 120 MG 24 hr capsule Take 120 mg by mouth daily.     Marland Kitchen doxepin (SINEQUAN) 10 MG capsule Take 10 mg by mouth at bedtime as needed (sleep).    Marland Kitchen doxepin (SINEQUAN) 25 MG capsule Take 25 mg by mouth at bedtime as needed (sleep).     . DULoxetine (CYMBALTA) 60 MG capsule Take 120 mg by mouth daily.    . ferrous sulfate 325 (65 FE) MG tablet Take 325 mg by mouth 2 (two) times daily with a meal.    . fluticasone (FLONASE ALLERGY RELIEF) 50 MCG/ACT nasal spray Place 1 spray into both nostrils daily. (Patient taking differently: Place 1 spray into both nostrils daily as needed for allergies. ) 16 g 2  . furosemide (LASIX) 40 MG tablet Take 40 mg by mouth daily.     Marland Kitchen gabapentin (NEURONTIN) 300 MG capsule Take 600 mg by mouth 2 (two) times daily.   1  . hydrALAZINE (APRESOLINE) 50 MG tablet Take 50 mg by mouth 3 (three) times daily.    . hyoscyamine (LEVSIN SL) 0.125 MG SL tablet Place 0.125 mg under the  tongue every 4 (four) hours as needed for cramping.     . insulin aspart (NOVOLOG) 100 UNIT/ML injection Inject 1-4 Units into the skin 3 (three) times daily with meals.     . ISOtretinoin (ACCUTANE) 40 MG capsule Take 40 mg by mouth daily.    Marland Kitchen lamoTRIgine (LAMICTAL) 200 MG tablet Take 400 mg by mouth at bedtime.     Marland Kitchen LEVEMIR FLEXTOUCH 100 UNIT/ML Pen Inject 18 Units into the skin every morning.   5  . levonorgestrel (MIRENA) 20 MCG/24HR IUD 1 each by Intrauterine route once.     Marland Kitchen losartan (COZAAR) 100 MG tablet Take 100 mg by mouth daily.    . metaxalone (SKELAXIN) 800 MG tablet Take 800 mg by mouth 3 (three) times daily as needed for muscle spasms.    . Probiotic Product (PROBIOTIC DAILY PO) Take 1 capsule by mouth daily.    . promethazine (PHENERGAN) 25 MG tablet Take 25 mg by mouth every 6 (six) hours as needed for nausea or vomiting.     . traMADol (ULTRAM) 50 MG tablet Take 100 mg by mouth 3 (three) times daily.  0   No current facility-administered medications for this visit.      ROS:  See HPI  Physical Exam:  Today's Vitals   02/20/18 1043  BP: (!) 191/96  Pulse: (!) 109  Resp: 14  Temp: 98.1 F (36.7 C)  TempSrc: Oral  SpO2: 100%  Weight: 127 lb 6.8 oz (57.8 kg)  Height: 5\' 1"  (1.549 m)   Body mass index is 24.08 kg/m.   Incision:      Extremities:  Easily palpable left radial and ulnar pulse; there is a thrill/bruit within the fistula   Dialysis duplex: Scheduled for 2/25 and see NP  Assessment/Plan:  This is a 32 y.o. female who is s/p: Left BC AVF by Dr. Donnetta Hutching on 01/17/2018 (not on dialysis)  -pt with erythema and drainage from incision.  This was probed with a CTA and minimal drainage from area.  Keflex 250mg  bid x 5 days given.  She has f/u appt in 5 days for duplex and see NP.  Will re-evaluate incision at that time.  She and her mother know to contact us sooner should it worsen.     Leontine Locket, PA-C [facility] [phone]  Clinic MD:  Oneida Alar

## 2018-02-20 ENCOUNTER — Ambulatory Visit (INDEPENDENT_AMBULATORY_CARE_PROVIDER_SITE_OTHER): Payer: Self-pay | Admitting: Physician Assistant

## 2018-02-20 ENCOUNTER — Encounter: Payer: Self-pay | Admitting: Family

## 2018-02-20 ENCOUNTER — Encounter: Payer: Self-pay | Admitting: Vascular Surgery

## 2018-02-20 VITALS — BP 191/96 | HR 109 | Temp 98.1°F | Resp 14 | Ht 61.0 in | Wt 127.4 lb

## 2018-02-20 DIAGNOSIS — N185 Chronic kidney disease, stage 5: Secondary | ICD-10-CM

## 2018-02-20 MED ORDER — CEPHALEXIN 250 MG PO CAPS: 250.0000 mg | ORAL_CAPSULE | Freq: Two times a day (BID) | ORAL | 0 refills | Status: DC

## 2018-02-25 ENCOUNTER — Encounter: Payer: Medicaid Other | Admitting: Family

## 2018-02-25 ENCOUNTER — Encounter (HOSPITAL_COMMUNITY): Payer: Medicaid Other

## 2018-02-26 ENCOUNTER — Ambulatory Visit (HOSPITAL_COMMUNITY)
Admission: RE | Admit: 2018-02-26 | Discharge: 2018-02-26 | Disposition: A | Discharge status: Home / Self Care | Payer: Medicaid Other | Source: Ambulatory Visit | Attending: Vascular Surgery | Admitting: Vascular Surgery

## 2018-02-26 ENCOUNTER — Other Ambulatory Visit: Payer: Self-pay

## 2018-02-26 ENCOUNTER — Ambulatory Visit (INDEPENDENT_AMBULATORY_CARE_PROVIDER_SITE_OTHER): Payer: Medicaid Other | Admitting: Physician Assistant

## 2018-02-26 VITALS — BP 178/94 | HR 100 | Temp 97.6°F | Resp 20 | Ht 61.0 in | Wt 127.0 lb

## 2018-02-26 DIAGNOSIS — N185 Chronic kidney disease, stage 5: Secondary | ICD-10-CM

## 2018-02-26 DIAGNOSIS — N184 Chronic kidney disease, stage 4 (severe): Secondary | ICD-10-CM

## 2018-02-26 DIAGNOSIS — Z720 Tobacco use: Secondary | ICD-10-CM

## 2018-02-26 NOTE — Progress Notes (Signed)
    Postoperative Access Visit   History of Present Illness   Tara Bullock is a 33 y.o. year old female who presents for postoperative follow-up for: left brachiocephalic arteriovenous fistula by Dr. Donnetta Hutching (Date: 01/17/18).  She was seen in office last week with some redness and fluctuance around the incision.  This area was drained and she was placed on a one-week Keflex regimen.  She returns to clinic today and states this area has improved.  She denies any fevers, chills, nausea/vomiting.  She is not yet on hemodialysis.  CKD is managed by Dr. Carolin Sicks.  She is also meeting with a transplant team for potential renal transplantation.  She denies signs or symptoms of steal syndrome in her left hand.  She is a current tobacco smoker.  Physical Examination   Vitals:   02/26/18 0941  BP: (!) 178/94  Pulse: 100  Resp: 20  Temp: 97.6 F (36.4 C)  SpO2: 100%  Weight: 127 lb (57.6 kg)  Height: 5\' 1"  (1.549 m)   Body mass index is 24 kg/m.  left arm  small area open with minimal serous drainage minimal surrounding erythema; palpable left radial pulse; palpable thrill throughout upper arm, hand grip is 5/5, sensation in digits is intact    Medical Decision Making   Tara Bullock is a 33 y.o. year old female who presents s/p left brachiocephalic arteriovenous fistula   Patent left brachiocephalic without signs or symptoms of steal syndrome  The patient's access will be ready for use 04/11/18  Incision is now healing well; recommended twice daily cleansing with soap and water, patting dry, and leaving incision open to air  The patient may follow up on a prn basis   Dagoberto Ligas PA-C [facility] [place] Office: [phone]  Clinic MD: Dr. Scot Dock

## 2018-05-09 ENCOUNTER — Encounter (HOSPITAL_COMMUNITY): Payer: Self-pay

## 2018-05-09 ENCOUNTER — Other Ambulatory Visit (HOSPITAL_COMMUNITY): Payer: Self-pay | Admitting: *Deleted

## 2018-05-09 ENCOUNTER — Other Ambulatory Visit: Payer: Self-pay

## 2018-05-09 ENCOUNTER — Encounter (HOSPITAL_COMMUNITY)
Admission: RE | Admit: 2018-05-09 | Discharge: 2018-05-09 | Disposition: A | Discharge status: Home / Self Care | Payer: Medicaid Other | Source: Ambulatory Visit | Attending: Nephrology | Admitting: Nephrology

## 2018-05-09 VITALS — BP 158/87 | HR 92 | Temp 98.6°F | Resp 16

## 2018-05-09 DIAGNOSIS — N184 Chronic kidney disease, stage 4 (severe): Secondary | ICD-10-CM

## 2018-05-09 MED ORDER — EPOETIN ALFA 10000 UNIT/ML IJ SOLN
10000.0000 [IU] | INTRAMUSCULAR | Status: DC
  Administered 2018-05-09: 10000 [IU] via SUBCUTANEOUS

## 2018-05-09 MED ORDER — EPOETIN ALFA 10000 UNIT/ML IJ SOLN
INTRAMUSCULAR | Status: AC
  Filled 2018-05-09: qty 1

## 2018-05-12 LAB — POCT HEMOGLOBIN-HEMACUE: Hemoglobin: 9.3 g/dL — ABNORMAL LOW (ref 12.0–15.0)

## 2018-05-20 IMAGING — CT CT RENAL STONE PROTOCOL
2 of 3 series · 15 of 40 positions shown, 17 images · non-contrast
Comparison: Abdominal CT dated 04/01/2015

CLINICAL DATA: 29-year-old female with left flank pain.

EXAM:
CT ABDOMEN AND PELVIS WITHOUT CONTRAST
TECHNIQUE: Multidetector CT imaging of the abdomen and pelvis was performed
following the standard protocol without IV contrast.

[Series 3: lung · axial · 0.59mm/px · z∈[-121,-77]mm · 12 of 26 slices shown, 14 images]
[im 3/26  soft-tissue]
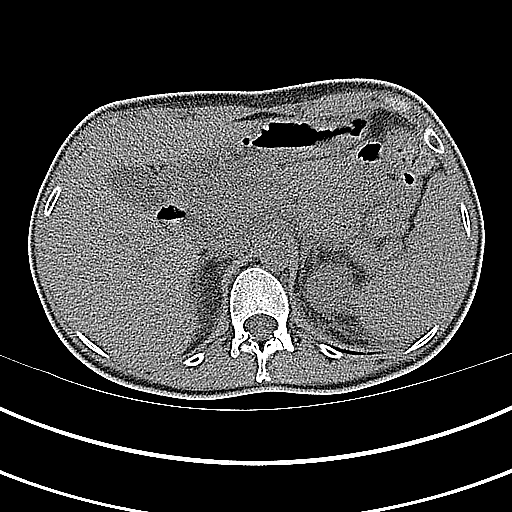
[im 3/26  bone]
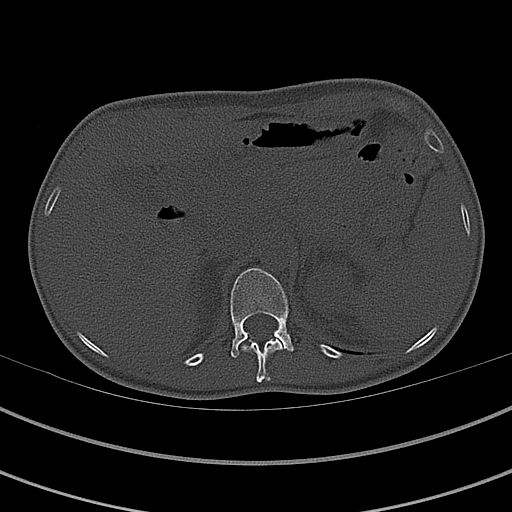
[im 5/26  soft-tissue]
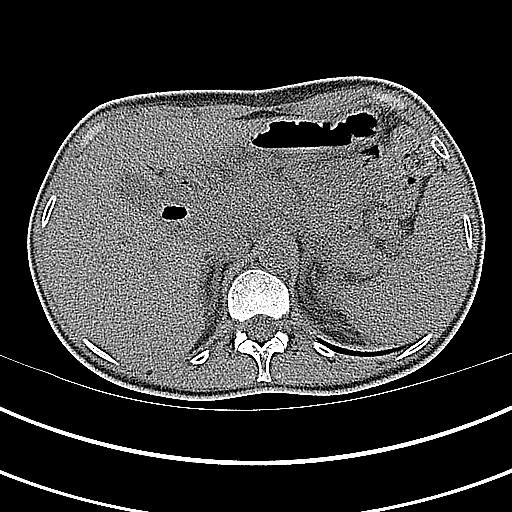
[im 7/26  soft-tissue]
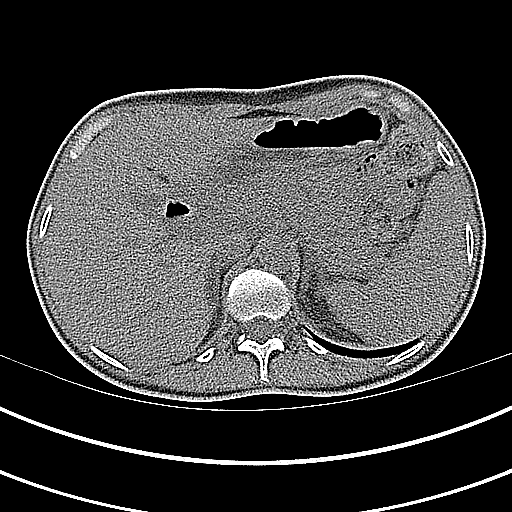
[im 9/26  soft-tissue]
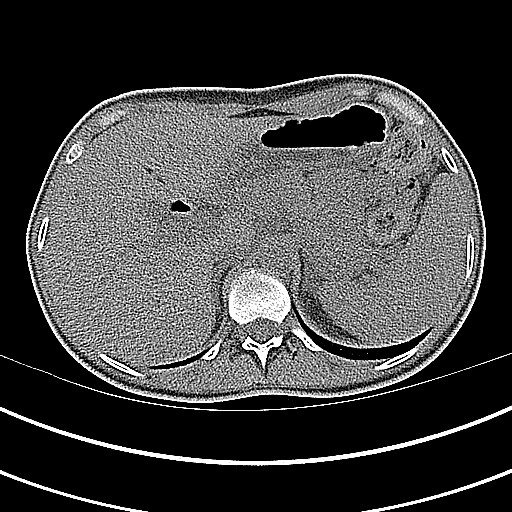
[im 11/26  soft-tissue]
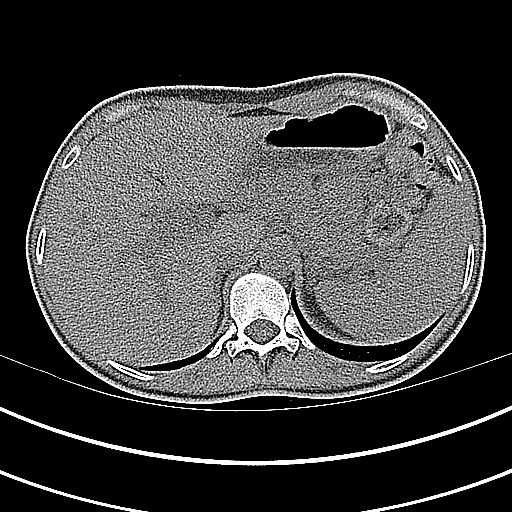
[im 13/26  soft-tissue]
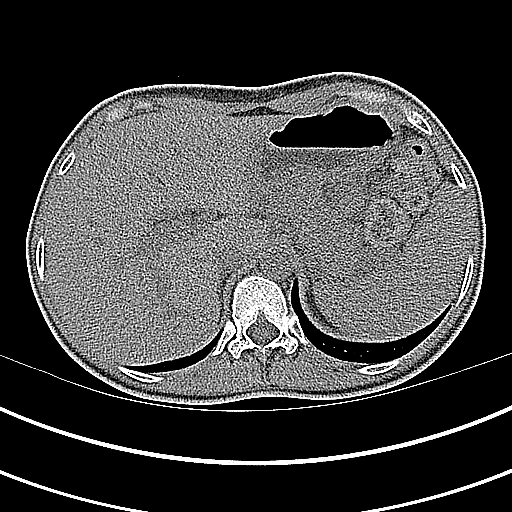
[im 15/26  soft-tissue]
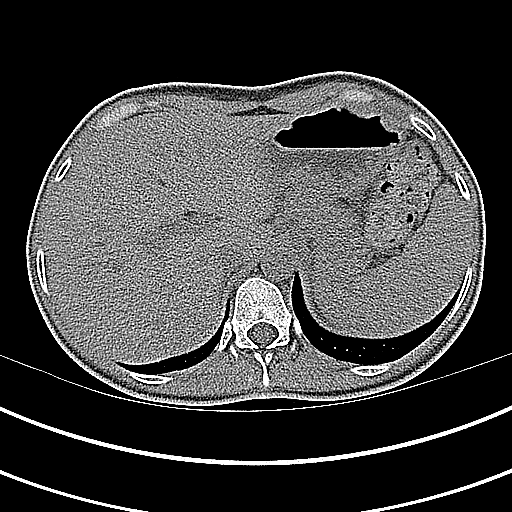
[im 17/26  soft-tissue]
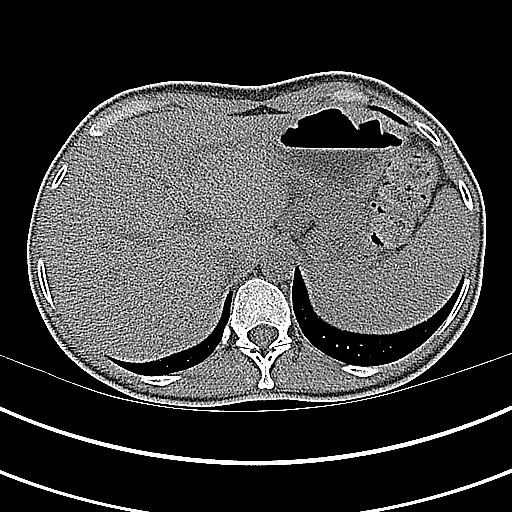
[im 19/26  soft-tissue]
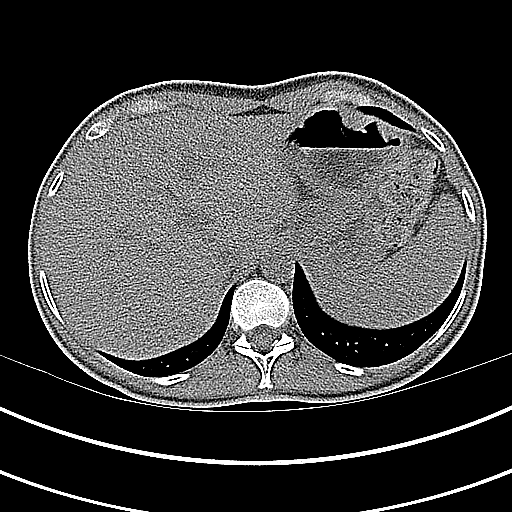
[im 19/26  bone]
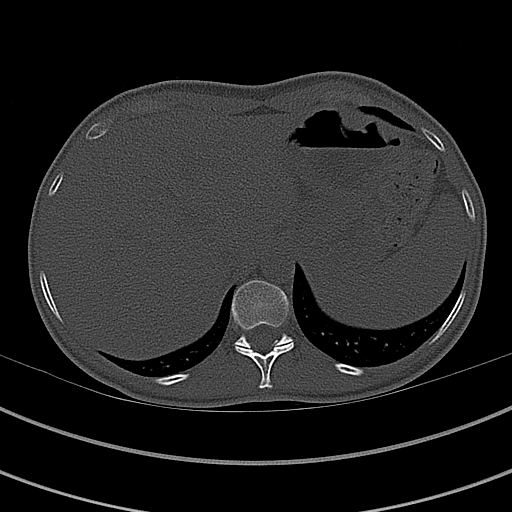
[im 21/26  soft-tissue]
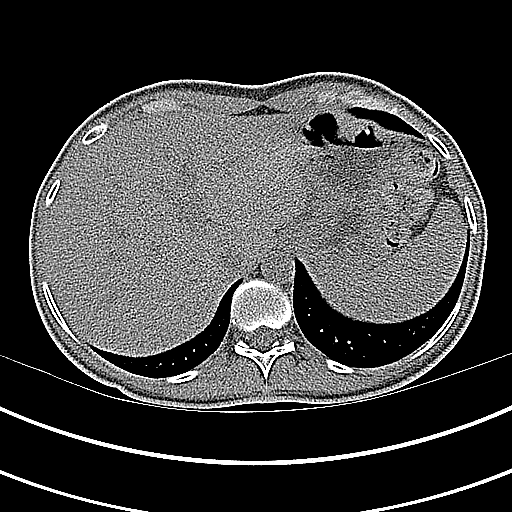
[im 23/26  soft-tissue]
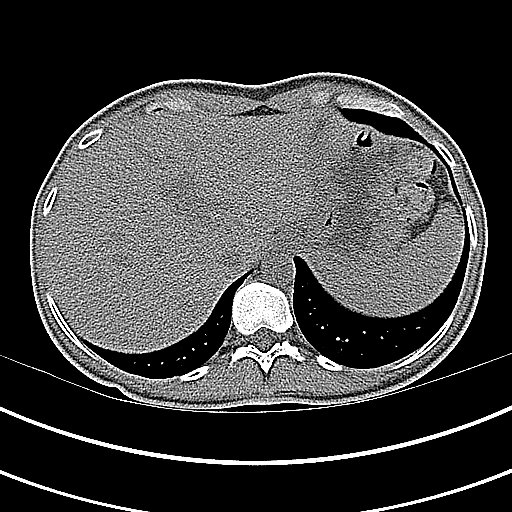
[im 25/26  soft-tissue]
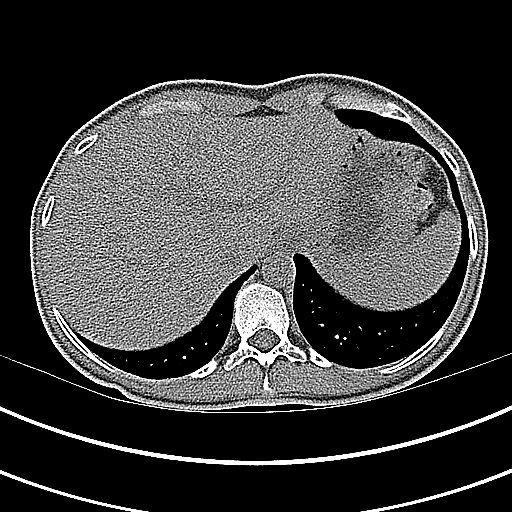

[Series 4: coronal · coronal · 0.59mm/px · 3 of 94 slices shown]
[im 32/94  soft-tissue]
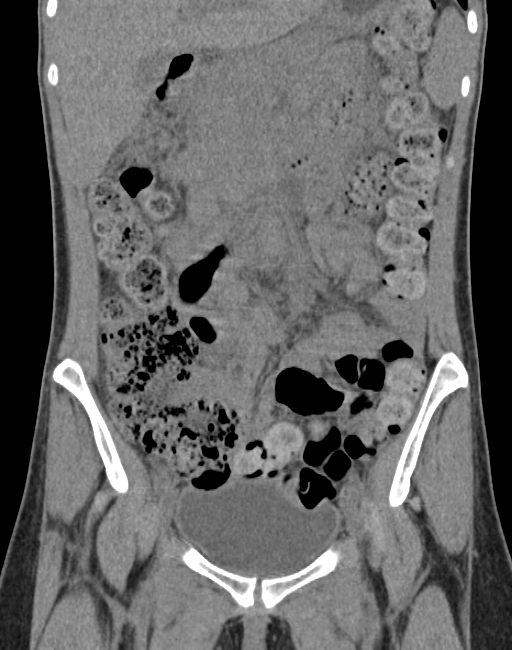
[im 42/94  soft-tissue]
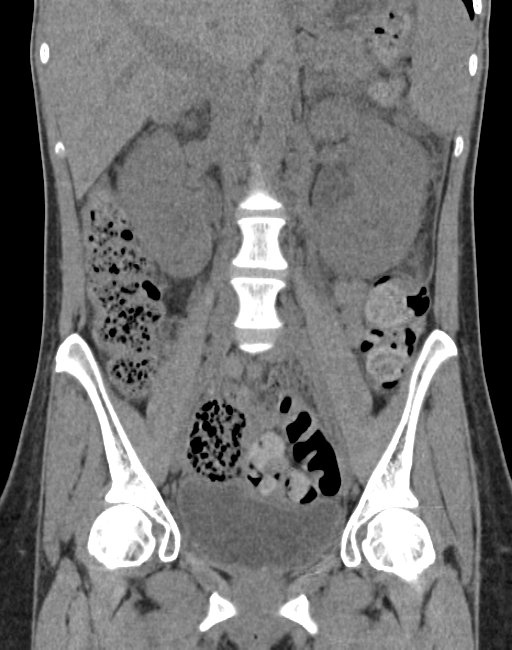
[im 52/94  soft-tissue]
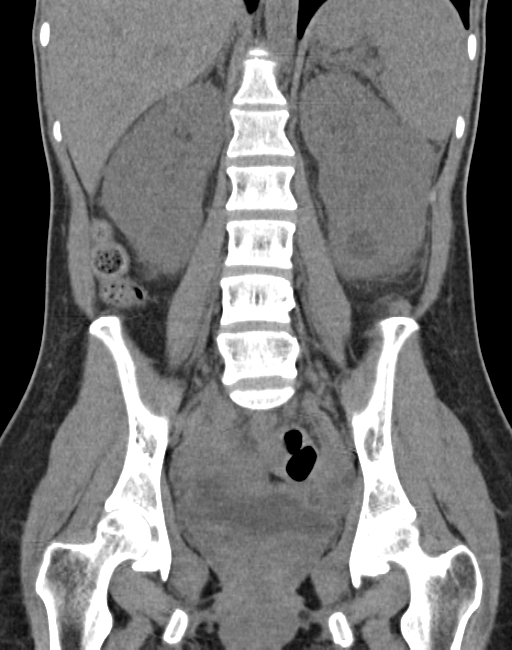

[15 of 40 positions shown; findings below may reference images not displayed]

FINDINGS: Evaluation of this exam is limited in the absence of intravenous
contrast.

Lower chest: The visualized lung bases are clear. No intra-abdominal
free air or free fluid.

Hepatobiliary: The visualized portions of the liver is unremarkable.
In the dome of the liver is not imaged. No intrahepatic biliary
ductal dilatation. The gallbladder is unremarkable.

Pancreas: Grossly unremarkable on limited visualization. Minimal
peripancreatic stranding may be present. Correlation with pancreatic
enzymes recommended.

Spleen: Normal in size without focal abnormality.

Adrenals/Urinary Tract: The adrenal glands appear unremarkable.
There is mild bilateral hydronephrosis, left greater right with
diffuse edema of the kidneys. A 2.1 x 1.9 cm hypodense lesion in the
inferior pole of the left kidney is similar to prior study and
likely represents a cyst. Faint radiodense foci in the interpolar
aspect of the left kidney may be artifactual or represent
nonobstructing small stones. The visualized ureters and urinary
bladder are grossly unremarkable.

Stomach/Bowel: Constipation. No evidence of bowel obstruction or
active inflammation. Normal appendix. Evaluation of the bowel is
however limited in the absence of oral contrast.

Vascular/Lymphatic: The abdominal aorta and IVC are grossly
unremarkable on this noncontrast study. No portal venous gas
identified. Multiple top-normal retroperitoneal lymph nodes.

Reproductive: The uterus is retroverted. An intrauterine device is
noted. There is a 2.5 x 3.3 cm hypodense lesion in the posterior
pelvis as seen on the prior CT likely representing an ovarian
follicle/cyst versus less likely a degenerating uterine fibroid.
Ultrasound may provide better evaluation of the pelvic structures.

Other:  Small fat containing umbilical hernia.

Musculoskeletal: No acute or significant osseous findings.
IMPRESSION: Mild bilateral hydronephrosis with edematous appearance of the
kidneys concerning for pyelonephritis. Correlation with urinalysis
recommended. No obstructing stone identified.

Constipation.  No bowel obstruction.  Normal appendix.

Probable ovarian cyst versus less likely degenerating uterine
fibroid.

## 2018-05-29 ENCOUNTER — Encounter (HOSPITAL_COMMUNITY): Payer: Self-pay | Admitting: *Deleted

## 2018-05-29 ENCOUNTER — Other Ambulatory Visit: Payer: Self-pay

## 2018-05-29 ENCOUNTER — Encounter: Payer: Medicaid Other | Admitting: Dietician

## 2018-05-29 ENCOUNTER — Emergency Department (HOSPITAL_COMMUNITY)
Admission: EM | Admit: 2018-05-29 | Discharge: 2018-05-29 | Disposition: A | Discharge status: Home / Self Care | Payer: Medicaid Other | Attending: Emergency Medicine | Admitting: Emergency Medicine

## 2018-05-29 DIAGNOSIS — Z20828 Contact with and (suspected) exposure to other viral communicable diseases: Secondary | ICD-10-CM | POA: Insufficient documentation

## 2018-05-29 DIAGNOSIS — E1022 Type 1 diabetes mellitus with diabetic chronic kidney disease: Secondary | ICD-10-CM | POA: Insufficient documentation

## 2018-05-29 DIAGNOSIS — Z87891 Personal history of nicotine dependence: Secondary | ICD-10-CM | POA: Insufficient documentation

## 2018-05-29 DIAGNOSIS — R05 Cough: Secondary | ICD-10-CM | POA: Diagnosis present

## 2018-05-29 DIAGNOSIS — I129 Hypertensive chronic kidney disease with stage 1 through stage 4 chronic kidney disease, or unspecified chronic kidney disease: Secondary | ICD-10-CM | POA: Insufficient documentation

## 2018-05-29 DIAGNOSIS — Z79899 Other long term (current) drug therapy: Secondary | ICD-10-CM | POA: Diagnosis not present

## 2018-05-29 DIAGNOSIS — R059 Cough, unspecified: Secondary | ICD-10-CM

## 2018-05-29 DIAGNOSIS — N184 Chronic kidney disease, stage 4 (severe): Secondary | ICD-10-CM | POA: Diagnosis not present

## 2018-05-29 NOTE — ED Triage Notes (Signed)
Pt sent over from her dietician, she arrived for an appointment and did not pass their Covid screening due to a recent exposure (approx 2-3 weeks ago) and she reports a mild cough for the last few weeks that started after exposure. Pt reports mild body aches, denies fever or other symptoms, reports her symptoms have been so mild she did not feel the need to seek treatment, no distress noted

## 2018-05-29 NOTE — Discharge Instructions (Signed)
Please read attached information. If you experience any new or worsening signs or symptoms please return to the emergency room for evaluation. Please follow-up with your primary care provider or specialist as discussed.  °

## 2018-05-29 NOTE — ED Notes (Signed)
Discharge instructions discussed with Pt. Pt verbalized understanding. Pt stable and ambulatory.    

## 2018-05-29 NOTE — ED Provider Notes (Signed)
[facility] EMERGENCY DEPARTMENT Provider Note   CSN: [id number] Arrival date & time: 05/29/18  1038    History   Chief Complaint Chief Complaint  Patient presents with  . Cough    HPI Loralyn Rachel is a 33 y.o. female.     HPI   33 year old female presents today at the request of her dietitian for evaluation for COVID.  She notes that she went to see her dietitian today and was told that due to the screening criteria she could not be seen and needed to be evaluated in the emergency room.  She notes that greater than 2 weeks ago her mother visited a friend who ended up testing positive for COVID.  She notes she has been around her mother, her mother but has been asymptomatic.  He notes she has had a dry nonproductive cough with no associated fever with minor rhinorrhea for the last 2 weeks.  She denies any chest pain or shortness of breath.  Approximately 1 week ago she had a COVID test so that she may go to this appointment but was not called with results.  Patient does not feel that she has coronavirus.  No other known exposures.  She does have significant past medical history including diabetes and hypertension. Past Medical History:  Diagnosis Date  . Anemia   . Anorexia nervosa   . Anxiety   . Arthritis    osteoarthritis and Rhematoid factor positive  . Chronic kidney disease    Stage 4  . Depression   . Fibromyalgia   . GERD (gastroesophageal reflux disease)   . Headache    pt denies  . History of hydronephrosis    left ureteral placement 04-02-2015  resolved  . History of kidney stones   . Hypertension   . IBS (irritable bowel syndrome)   . Insomnia   . Neuromuscular disorder ([place])   . Pneumonia   . Renal cyst, left   . Retained ureteral stent    left  . Rheumatoid factor positive   . 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 Wears glasses     Patient Active Problem List   Diagnosis Date Noted  . Chronic kidney disease (CKD), stage IV (severe) ([place]) 02/26/2018  . Pyelonephritis 10/03/2015  . AKI (acute kidney injury) ([place]) 10/03/2015  . Hyperglycemia 04/02/2015  . Diabetic hyperosmolar non-ketotic state ([place]) 04/02/2015  . Hydronephrosis, left 04/02/2015  . Allergic rhinitis 04/01/2014  . Anorexia nervosa 11/06/2013  . DM type 1, not at goal [facility]) 09/16/2011  . Dehydration 09/16/2011  . Hyponatremia ([place]) due to DKA 09/16/2011  . Leukocytosis 09/16/2011  . Bronchitis, acute 09/16/2011  . Influenza-like illness 09/16/2011  . Tobacco abuse 09/16/2011  . Medical non-compliance 09/16/2011    Past Surgical History:  Procedure Laterality Date  . AV FISTULA PLACEMENT Left 01/17/2018   Procedure: ARTERIOVENOUS (AV) FISTULA CREATION;  Surgeon: [name], MD;  Location: [facility];  Service: Vascular;  Laterality: Left;  . BREAST BIOPSY Right    PASH  . BREAST EXCISIONAL BIOPSY Left    FA  . BREAST LUMPECTOMY Left   . CYSTOSCOPY W/ URETERAL STENT REMOVAL Left 05/31/2015   Procedure: CYSTOSCOPY WITH LEFT URETERAL  STENT REMOVAL,AND  RETROGRADE PYELOGRAM;  Surgeon: Nickie Retort, MD;  Location: [facility];  Service: Urology;  Laterality: Left;  . Lydia  Left 04/02/2015   Procedure: CYSTOSCOPY, RETROGRADE PYELOGRAM WITH LEFT STENT PLACEMENT;  Surgeon: Nickie Retort, MD;  Location: [facility];  Service: Urology;  Laterality: Left;  . LEEP  2009  . LEEP    . PARS PLANA VITRECTOMY Left 12/16/2015   Procedure: LEFT EYE PARS PLANA VITRECTOMY WITH ENDO LASER;  Surgeon: Jalene Mullet, MD;  Location: [facility];  Service: Ophthalmology;  Laterality: Left;  . PARS PLANA VITRECTOMY Right 12/04/2016   Procedure: PARS PLANA VITRECTOMY WITH 25 GAUGE WITH ENDO LASER;  Surgeon: Jalene Mullet, MD;  Location: [facility];  Service: Ophthalmology;  Laterality: Right;  . 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 RIGHT EYE;  Surgeon: Jalene Mullet, MD;  Location: [facility];  Service: Ophthalmology;  Laterality: Right;     OB History   No obstetric history on file.      Home Medications    Prior to Admission medications   Medication Sig Start Date End Date Taking? Authorizing Provider  ALPRAZolam Duanne Moron) 0.5 MG tablet Take 0.5 mg by mouth 3 (three) times daily as needed for sleep or anxiety.     [provider]  ARIPiprazole (ABILIFY) 5 MG tablet Take 5 mg by mouth daily.  03/26/17   [provider]  calcitRIOL (ROCALTROL) 0.25 MCG capsule Take 0.25 mcg by mouth daily.  11/09/17   [provider]  cephALEXin (KEFLEX) 250 MG capsule Take 1 capsule (250 mg total) by mouth 2 (two) times daily. 02/20/18   Rhyne, Hulen Shouts, PA-C  cyclobenzaprine (FLEXERIL) 10 MG tablet Take 10 mg by mouth 3 (three) times daily as needed for muscle spasms.     [provider]  diltiazem (DILT-XR) 120 MG 24 hr capsule Take 120 mg by mouth daily.  11/09/17   [provider]  doxepin (SINEQUAN) 10 MG capsule Take 10 mg by mouth at bedtime as needed (sleep).    [provider]  doxepin (SINEQUAN) 25 MG capsule Take 25 mg by mouth at bedtime as needed (sleep).     [provider]  DULoxetine (CYMBALTA) 60 MG capsule Take 120 mg by mouth daily.    [provider]  ferrous sulfate 325 (65 FE) MG tablet Take 325 mg by mouth 2 (two) times daily with a meal.    [provider]  fluticasone (FLONASE ALLERGY RELIEF) 50 MCG/ACT nasal spray Place 1 spray into both nostrils daily. Patient taking differently: Place 1 spray into both nostrils daily as needed for allergies.  04/01/14   Hilton Sinclair, MD  furosemide (LASIX) 40 MG tablet Take 40 mg by mouth daily.   11/21/17   [provider]  gabapentin (NEURONTIN) 300 MG capsule Take 600 mg by mouth 2 (two) times daily.  10/26/13   [provider]  hydrALAZINE (APRESOLINE) 50 MG tablet Take 50 mg by mouth 3 (three) times daily.    [provider]  hyoscyamine (LEVSIN SL) 0.125 MG SL tablet Place 0.125 mg under the tongue every 4 (four) hours as needed for cramping.     [provider]  insulin aspart (NOVOLOG) 100 UNIT/ML injection Inject 1-4 Units into the skin 3 (three) times daily with meals.     [provider]  ISOtretinoin (ACCUTANE) 40 MG capsule Take 40 mg by mouth daily.    [provider]  lamoTRIgine (LAMICTAL) 200  MG tablet Take 400 mg by mouth at bedtime.     [provider]  LEVEMIR FLEXTOUCH 100 UNIT/ML Pen Inject 18 Units into the skin every morning.  08/27/13   [provider]  levonorgestrel (MIRENA) 20 MCG/24HR IUD 1 each by Intrauterine route once.     [provider]  losartan (COZAAR) 100 MG tablet Take 100 mg by mouth daily.    [provider]  metaxalone (SKELAXIN) 800 MG tablet Take 800 mg by mouth 3 (three) times daily as needed for muscle spasms.    [provider]  Probiotic Product (PROBIOTIC DAILY PO) Take 1 capsule by mouth daily.    [provider]  promethazine (PHENERGAN) 25 MG tablet Take 25 mg by mouth every 6 (six) hours as needed for nausea or vomiting.     [provider]  traMADol (ULTRAM) 50 MG tablet Take 100 mg by mouth 3 (three) times daily.  10/28/13   [provider]    Family History Family History  Problem Relation Age of Onset  . Hypertension Mother   . Hypertension Father     Social History Social History   Tobacco Use  . Smoking status: Former Smoker    Packs/day: 1.00    Years: 15.00    Pack years: 15.00    Types: Cigarettes    Last attempt to quit: 07/01/2016    Years since quitting: 1.9  . Smokeless tobacco: Never Used   . Tobacco comment: last use January 10, 2018  Substance Use Topics  . Alcohol use: No  . Drug use: No     Allergies   Ivp dye [iodinated diagnostic agents]; Lisinopril; Prednisone; and Adhesive [tape]   Review of Systems Review of Systems  All other systems reviewed and are negative.    Physical Exam Updated Vital Signs BP (!) 164/86 (BP Location: Right Arm)   Pulse 84   Temp 98.1 F (36.7 C) (Oral)   Resp 20   SpO2 100%   Physical Exam Vitals signs and nursing note reviewed.  Constitutional:      Appearance: She is well-developed.  HENT:     Head: Normocephalic and atraumatic.  Eyes:     General: No scleral icterus.       Right eye: No discharge.        Left eye: No discharge.     Conjunctiva/sclera: Conjunctivae normal.     Pupils: Pupils are equal, round, and reactive to light.  Neck:     Musculoskeletal: Normal range of motion.     Vascular: No JVD.     Trachea: No tracheal deviation.  Pulmonary:     Effort: No respiratory distress.     Breath sounds: Normal breath sounds. No stridor. No wheezing, rhonchi or rales.  Neurological:     Mental Status: She is alert and oriented to person, place, and time.     Coordination: Coordination normal.  Psychiatric:        Behavior: Behavior normal.        Thought Content: Thought content normal.        Judgment: Judgment normal.      ED Treatments / Results  Labs (all labs ordered are listed, but only abnormal results are displayed) Labs Reviewed - No data to display  EKG None  Radiology No results found.  Procedures Procedures (including critical care time)  Medications Ordered in ED Medications - No data to display   Initial Impression / Assessment and Plan / ED  Course  I have reviewed the triage vital signs and the nursing notes.  Pertinent labs & imaging results that were available during my care of the patient were reviewed by me and considered in my medical decision making (see chart for  details).          Assessment/Plan: 33 year old female presents today at the request of her dietitian.  Patient has a nonproductive cough over the last 2 weeks.  She has no shortness of breath her lung sounds are clear.  I discussed chest x-ray with her she does not feel this is necessary, I agree she is probably having postnasal drainage causing this cough with no acute signs of infectious etiology.  I will defer imaging study to primary care if symptoms continue to persist.    The reason she is here is a reported exposure to COVID.  She was not exposed to [place].  Her mother had exposure to someone who did have COVID her mother does not have symptoms.  This was greater than 2 weeks ago, she had a negative COVID test in the meantime.  I discussed strict return precautions with the patient, she will return immediately if she develops any new or worsening signs or symptoms, she will follow-up as an outpatient.  She verbalized understanding and agreement to today's plan.  The patient offered cough medication she does not feel this is necessary.     Final Clinical Impressions(s) / ED Diagnoses   Final diagnoses:  Cough    ED Discharge Orders    None       Francee Gentile 05/29/18 1143    Elnora Morrison, MD 05/29/18 1546

## 2018-06-05 ENCOUNTER — Encounter: Payer: Medicaid Other | Attending: Family Medicine | Admitting: Dietician

## 2018-06-06 ENCOUNTER — Inpatient Hospital Stay (HOSPITAL_COMMUNITY): Admission: RE | Admit: 2018-06-06 | Payer: Medicaid Other | Source: Ambulatory Visit

## 2018-06-09 ENCOUNTER — Inpatient Hospital Stay: Admission: RE | Admit: 2018-06-09 | Payer: Medicaid Other | Source: Ambulatory Visit

## 2018-06-26 ENCOUNTER — Inpatient Hospital Stay (HOSPITAL_COMMUNITY)
Admission: RE | Admit: 2018-06-26 | Discharge: 2018-06-26 | Disposition: A | Discharge status: Home / Self Care | Payer: Medicaid Other | Source: Ambulatory Visit | Attending: Nephrology | Admitting: Nephrology

## 2018-07-03 ENCOUNTER — Encounter (HOSPITAL_COMMUNITY): Payer: Medicaid Other

## 2018-07-18 ENCOUNTER — Ambulatory Visit: Payer: Medicaid Other | Admitting: Dietician

## 2018-07-24 ENCOUNTER — Encounter (HOSPITAL_COMMUNITY): Payer: Medicaid Other

## 2018-09-24 ENCOUNTER — Other Ambulatory Visit: Payer: Self-pay

## 2018-09-24 ENCOUNTER — Encounter (HOSPITAL_COMMUNITY)
Admission: RE | Admit: 2018-09-24 | Discharge: 2018-09-24 | Disposition: A | Discharge status: Home / Self Care | Payer: Medicaid Other | Source: Ambulatory Visit | Attending: Nephrology | Admitting: Nephrology

## 2018-09-24 VITALS — BP 136/81 | HR 103 | Temp 97.1°F | Resp 18

## 2018-09-24 DIAGNOSIS — N184 Chronic kidney disease, stage 4 (severe): Secondary | ICD-10-CM | POA: Insufficient documentation

## 2018-09-24 LAB — RENAL FUNCTION PANEL
Albumin: 3.6 g/dL (ref 3.5–5.0)
Anion gap: 11 (ref 5–15)
BUN: 28 mg/dL — ABNORMAL HIGH (ref 6–20)
CO2: 24 mmol/L (ref 22–32)
Calcium: 9.1 mg/dL (ref 8.9–10.3)
Chloride: 100 mmol/L (ref 98–111)
Creatinine, Ser: 3.17 mg/dL — ABNORMAL HIGH (ref 0.44–1.00)
GFR calc Af Amer: 21 mL/min — ABNORMAL LOW (ref >60)
GFR calc non Af Amer: 18 mL/min — ABNORMAL LOW (ref >60)
Glucose, Bld: 266 mg/dL — ABNORMAL HIGH (ref 70–99)
Phosphorus: 4.5 mg/dL (ref 2.5–4.6)
Potassium: 3.7 mmol/L (ref 3.5–5.1)
Sodium: 135 mmol/L (ref 135–145)

## 2018-09-24 LAB — CBC WITH DIFFERENTIAL/PLATELET
Abs Immature Granulocytes: 0.03 10*3/uL (ref 0.00–0.07)
Basophils Absolute: 0.1 10*3/uL (ref 0.0–0.1)
Basophils Relative: 1 %
Eosinophils Absolute: 0.2 10*3/uL (ref 0.0–0.5)
Eosinophils Relative: 2 %
HCT: 35.3 % — ABNORMAL LOW (ref 36.0–46.0)
Hemoglobin: 11.2 g/dL — ABNORMAL LOW (ref 12.0–15.0)
Immature Granulocytes: 0 %
Lymphocytes Relative: 33 %
Lymphs Abs: 3 10*3/uL (ref 0.7–4.0)
MCH: 27.2 pg (ref 26.0–34.0)
MCHC: 31.7 g/dL (ref 30.0–36.0)
MCV: 85.7 fL (ref 80.0–100.0)
Monocytes Absolute: 0.6 10*3/uL (ref 0.1–1.0)
Monocytes Relative: 7 %
Neutro Abs: 5.2 10*3/uL (ref 1.7–7.7)
Neutrophils Relative %: 57 %
Platelets: 383 10*3/uL (ref 150–400)
RBC: 4.12 MIL/uL (ref 3.87–5.11)
RDW: 12.4 % (ref 11.5–15.5)
WBC: 9 10*3/uL (ref 4.0–10.5)
nRBC: 0 % (ref 0.0–0.2)

## 2018-09-24 LAB — POCT HEMOGLOBIN-HEMACUE: Hemoglobin: 11.3 g/dL — ABNORMAL LOW (ref 12.0–15.0)

## 2018-09-24 LAB — IRON AND TIBC
Iron: 78 ug/dL (ref 28–170)
Saturation Ratios: 28 % (ref 10.4–31.8)
TIBC: 277 ug/dL (ref 250–450)
UIBC: 199 ug/dL

## 2018-09-24 LAB — FERRITIN: Ferritin: 158 ng/mL (ref 11–307)

## 2018-09-24 MED ORDER — EPOETIN ALFA 10000 UNIT/ML IJ SOLN: 10000.0000 [IU] | INTRAMUSCULAR | Status: DC

## 2018-09-24 MED ORDER — EPOETIN ALFA 10000 UNIT/ML IJ SOLN
INTRAMUSCULAR | Status: AC
  Administered 2018-09-24: 10000 [IU]
  Filled 2018-09-24: qty 1

## 2018-09-25 LAB — PTH, INTACT AND CALCIUM
Calcium, Total (PTH): 9.2 mg/dL (ref 8.7–10.2)
PTH: 238 pg/mL — ABNORMAL HIGH (ref 15–65)

## 2018-09-25 LAB — VITAMIN D 25 HYDROXY (VIT D DEFICIENCY, FRACTURES): Vit D, 25-Hydroxy: 23.7 ng/mL — ABNORMAL LOW (ref 30.0–100.0)

## 2018-10-22 ENCOUNTER — Encounter (HOSPITAL_COMMUNITY): Payer: Medicaid Other

## 2018-10-23 ENCOUNTER — Other Ambulatory Visit (HOSPITAL_COMMUNITY): Payer: Self-pay | Admitting: *Deleted

## 2018-10-24 ENCOUNTER — Ambulatory Visit (HOSPITAL_COMMUNITY)
Admission: RE | Admit: 2018-10-24 | Discharge: 2018-10-24 | Disposition: A | Discharge status: Home / Self Care | Payer: Medicaid Other | Source: Ambulatory Visit | Attending: Nephrology | Admitting: Nephrology

## 2018-10-24 VITALS — BP 143/82 | HR 95 | Temp 97.1°F | Resp 18

## 2018-10-24 DIAGNOSIS — N184 Chronic kidney disease, stage 4 (severe): Secondary | ICD-10-CM | POA: Insufficient documentation

## 2018-10-24 LAB — POCT HEMOGLOBIN-HEMACUE: Hemoglobin: 10.9 g/dL — ABNORMAL LOW (ref 12.0–15.0)

## 2018-10-24 MED ORDER — EPOETIN ALFA 10000 UNIT/ML IJ SOLN
10000.0000 [IU] | INTRAMUSCULAR | Status: DC
  Administered 2018-10-24: 10000 [IU] via SUBCUTANEOUS

## 2018-10-24 MED ORDER — EPOETIN ALFA 10000 UNIT/ML IJ SOLN
INTRAMUSCULAR | Status: AC
  Filled 2018-10-24: qty 1

## 2018-10-29 DIAGNOSIS — E1143 Type 2 diabetes mellitus with diabetic autonomic (poly)neuropathy: Secondary | ICD-10-CM | POA: Insufficient documentation

## 2018-10-29 DIAGNOSIS — G629 Polyneuropathy, unspecified: Secondary | ICD-10-CM | POA: Insufficient documentation

## 2018-10-29 DIAGNOSIS — H3500 Unspecified background retinopathy: Secondary | ICD-10-CM | POA: Insufficient documentation

## 2018-10-29 DIAGNOSIS — I770 Arteriovenous fistula, acquired: Secondary | ICD-10-CM | POA: Insufficient documentation

## 2018-10-30 ENCOUNTER — Other Ambulatory Visit: Payer: Self-pay | Admitting: Physician Assistant

## 2018-10-30 ENCOUNTER — Other Ambulatory Visit: Payer: Self-pay | Admitting: Adult Health Nurse Practitioner

## 2018-10-30 DIAGNOSIS — N6012 Diffuse cystic mastopathy of left breast: Secondary | ICD-10-CM

## 2018-10-30 DIAGNOSIS — N6011 Diffuse cystic mastopathy of right breast: Secondary | ICD-10-CM

## 2018-10-30 DIAGNOSIS — N6310 Unspecified lump in the right breast, unspecified quadrant: Secondary | ICD-10-CM

## 2018-11-04 ENCOUNTER — Other Ambulatory Visit: Payer: Self-pay | Admitting: Physician Assistant

## 2018-11-14 ENCOUNTER — Ambulatory Visit
Admission: RE | Admit: 2018-11-14 | Discharge: 2018-11-14 | Disposition: A | Discharge status: Home / Self Care | Payer: Medicaid Other | Source: Ambulatory Visit | Attending: Physician Assistant | Admitting: Physician Assistant

## 2018-11-14 ENCOUNTER — Other Ambulatory Visit: Payer: Self-pay | Admitting: Physician Assistant

## 2018-11-14 ENCOUNTER — Ambulatory Visit: Payer: Medicaid Other

## 2018-11-14 ENCOUNTER — Ambulatory Visit
Admission: RE | Admit: 2018-11-14 | Discharge: 2018-11-14 | Disposition: A | Discharge status: Home / Self Care | Payer: Medicaid Other | Source: Ambulatory Visit | Attending: Adult Health Nurse Practitioner | Admitting: Adult Health Nurse Practitioner

## 2018-11-14 ENCOUNTER — Other Ambulatory Visit: Payer: Self-pay

## 2018-11-14 DIAGNOSIS — N6310 Unspecified lump in the right breast, unspecified quadrant: Secondary | ICD-10-CM

## 2018-11-14 DIAGNOSIS — N6011 Diffuse cystic mastopathy of right breast: Secondary | ICD-10-CM

## 2018-11-14 DIAGNOSIS — N6320 Unspecified lump in the left breast, unspecified quadrant: Secondary | ICD-10-CM

## 2018-11-21 ENCOUNTER — Encounter (HOSPITAL_COMMUNITY): Payer: Medicaid Other

## 2018-12-10 ENCOUNTER — Other Ambulatory Visit (HOSPITAL_COMMUNITY): Payer: Self-pay | Admitting: *Deleted

## 2018-12-11 ENCOUNTER — Other Ambulatory Visit: Payer: Self-pay

## 2018-12-11 ENCOUNTER — Ambulatory Visit (HOSPITAL_COMMUNITY)
Admission: RE | Admit: 2018-12-11 | Discharge: 2018-12-11 | Disposition: A | Discharge status: Home / Self Care | Payer: Medicaid Other | Source: Ambulatory Visit | Attending: Nephrology | Admitting: Nephrology

## 2018-12-11 VITALS — BP 158/81 | HR 87 | Temp 97.1°F | Resp 18 | Ht 61.0 in | Wt 125.0 lb

## 2018-12-11 DIAGNOSIS — N184 Chronic kidney disease, stage 4 (severe): Secondary | ICD-10-CM | POA: Insufficient documentation

## 2018-12-11 LAB — POCT HEMOGLOBIN-HEMACUE: Hemoglobin: 10.9 g/dL — ABNORMAL LOW (ref 12.0–15.0)

## 2018-12-11 MED ORDER — EPOETIN ALFA 10000 UNIT/ML IJ SOLN
INTRAMUSCULAR | Status: AC
  Filled 2018-12-11: qty 1

## 2018-12-11 MED ORDER — SODIUM CHLORIDE 0.9 % IV SOLN
510.0000 mg | INTRAVENOUS | Status: DC
  Administered 2018-12-11: 510 mg via INTRAVENOUS
  Filled 2018-12-11: qty 17

## 2018-12-11 MED ORDER — EPOETIN ALFA 10000 UNIT/ML IJ SOLN
10000.0000 [IU] | INTRAMUSCULAR | Status: DC
  Administered 2018-12-11: 10000 [IU] via SUBCUTANEOUS

## 2018-12-18 ENCOUNTER — Encounter (HOSPITAL_COMMUNITY): Payer: Medicaid Other

## 2018-12-19 ENCOUNTER — Encounter (HOSPITAL_COMMUNITY): Payer: Medicaid Other

## 2019-01-08 ENCOUNTER — Other Ambulatory Visit: Payer: Self-pay

## 2019-01-08 ENCOUNTER — Encounter (HOSPITAL_COMMUNITY)
Admission: RE | Admit: 2019-01-08 | Discharge: 2019-01-08 | Disposition: A | Discharge status: Home / Self Care | Payer: Medicaid Other | Source: Ambulatory Visit | Attending: Nephrology | Admitting: Nephrology

## 2019-01-08 VITALS — BP 168/77 | HR 82 | Temp 96.3°F | Resp 18 | Ht 61.0 in | Wt 125.0 lb

## 2019-01-08 DIAGNOSIS — N184 Chronic kidney disease, stage 4 (severe): Secondary | ICD-10-CM | POA: Diagnosis not present

## 2019-01-08 LAB — FERRITIN: Ferritin: 216 ng/mL (ref 11–307)

## 2019-01-08 LAB — RENAL FUNCTION PANEL
Albumin: 3.3 g/dL — ABNORMAL LOW (ref 3.5–5.0)
Anion gap: 10 (ref 5–15)
BUN: 25 mg/dL — ABNORMAL HIGH (ref 6–20)
CO2: 25 mmol/L (ref 22–32)
Calcium: 8.8 mg/dL — ABNORMAL LOW (ref 8.9–10.3)
Chloride: 103 mmol/L (ref 98–111)
Creatinine, Ser: 2.29 mg/dL — ABNORMAL HIGH (ref 0.44–1.00)
GFR calc Af Amer: 31 mL/min — ABNORMAL LOW (ref >60)
GFR calc non Af Amer: 27 mL/min — ABNORMAL LOW (ref >60)
Glucose, Bld: 245 mg/dL — ABNORMAL HIGH (ref 70–99)
Phosphorus: 3.8 mg/dL (ref 2.5–4.6)
Potassium: 3.1 mmol/L — ABNORMAL LOW (ref 3.5–5.1)
Sodium: 138 mmol/L (ref 135–145)

## 2019-01-08 LAB — VITAMIN D 25 HYDROXY (VIT D DEFICIENCY, FRACTURES): Vit D, 25-Hydroxy: 14 ng/mL — ABNORMAL LOW (ref 30–100)

## 2019-01-08 LAB — CBC WITH DIFFERENTIAL/PLATELET
Abs Immature Granulocytes: 0.04 10*3/uL (ref 0.00–0.07)
Basophils Absolute: 0.1 10*3/uL (ref 0.0–0.1)
Basophils Relative: 0 %
Eosinophils Absolute: 0.1 10*3/uL (ref 0.0–0.5)
Eosinophils Relative: 1 %
HCT: 37.3 % (ref 36.0–46.0)
Hemoglobin: 11.7 g/dL — ABNORMAL LOW (ref 12.0–15.0)
Immature Granulocytes: 0 %
Lymphocytes Relative: 15 %
Lymphs Abs: 1.7 10*3/uL (ref 0.7–4.0)
MCH: 27 pg (ref 26.0–34.0)
MCHC: 31.4 g/dL (ref 30.0–36.0)
MCV: 86.1 fL (ref 80.0–100.0)
Monocytes Absolute: 0.7 10*3/uL (ref 0.1–1.0)
Monocytes Relative: 6 %
Neutro Abs: 8.8 10*3/uL — ABNORMAL HIGH (ref 1.7–7.7)
Neutrophils Relative %: 78 %
Platelets: 251 10*3/uL (ref 150–400)
RBC: 4.33 MIL/uL (ref 3.87–5.11)
RDW: 13.4 % (ref 11.5–15.5)
WBC: 11.3 10*3/uL — ABNORMAL HIGH (ref 4.0–10.5)
nRBC: 0 % (ref 0.0–0.2)

## 2019-01-08 LAB — IRON AND TIBC
Iron: 54 ug/dL (ref 28–170)
Saturation Ratios: 25 % (ref 10.4–31.8)
TIBC: 220 ug/dL — ABNORMAL LOW (ref 250–450)
UIBC: 166 ug/dL

## 2019-01-08 LAB — POCT HEMOGLOBIN-HEMACUE: Hemoglobin: 11.4 g/dL — ABNORMAL LOW (ref 12.0–15.0)

## 2019-01-08 MED ORDER — SODIUM CHLORIDE 0.9 % IV SOLN
510.0000 mg | INTRAVENOUS | Status: DC
  Administered 2019-01-08: 510 mg via INTRAVENOUS
  Filled 2019-01-08: qty 17

## 2019-01-08 MED ORDER — EPOETIN ALFA 10000 UNIT/ML IJ SOLN: 10000.0000 [IU] | INTRAMUSCULAR | Status: DC

## 2019-01-08 MED ORDER — EPOETIN ALFA 10000 UNIT/ML IJ SOLN
INTRAMUSCULAR | Status: AC
  Administered 2019-01-08: 09:00:00 10000 [IU] via SUBCUTANEOUS
  Filled 2019-01-08: qty 1

## 2019-01-09 LAB — PTH, INTACT AND CALCIUM
Calcium, Total (PTH): 8.8 mg/dL (ref 8.7–10.2)
PTH: 151 pg/mL — ABNORMAL HIGH (ref 15–65)

## 2019-02-02 ENCOUNTER — Other Ambulatory Visit: Payer: Self-pay | Admitting: Physician Assistant

## 2019-02-05 ENCOUNTER — Other Ambulatory Visit: Payer: Self-pay

## 2019-02-05 ENCOUNTER — Ambulatory Visit (HOSPITAL_COMMUNITY)
Admission: RE | Admit: 2019-02-05 | Discharge: 2019-02-05 | Disposition: A | Discharge status: Home / Self Care | Payer: Medicaid Other | Source: Ambulatory Visit | Attending: Nephrology | Admitting: Nephrology

## 2019-02-05 VITALS — BP 180/90 | HR 90 | Temp 96.8°F | Resp 18

## 2019-02-05 DIAGNOSIS — N184 Chronic kidney disease, stage 4 (severe): Secondary | ICD-10-CM | POA: Insufficient documentation

## 2019-02-05 LAB — POCT HEMOGLOBIN-HEMACUE: Hemoglobin: 12.4 g/dL (ref 12.0–15.0)

## 2019-02-05 MED ORDER — EPOETIN ALFA 10000 UNIT/ML IJ SOLN: 10000.0000 [IU] | INTRAMUSCULAR | Status: DC

## 2019-02-19 ENCOUNTER — Encounter (HOSPITAL_COMMUNITY): Payer: Medicaid Other

## 2019-03-05 ENCOUNTER — Encounter (HOSPITAL_COMMUNITY): Payer: Medicaid Other

## 2019-05-14 ENCOUNTER — Other Ambulatory Visit: Payer: Self-pay | Admitting: Family Medicine

## 2019-05-14 DIAGNOSIS — N644 Mastodynia: Secondary | ICD-10-CM

## 2019-05-14 DIAGNOSIS — N6012 Diffuse cystic mastopathy of left breast: Secondary | ICD-10-CM

## 2019-05-21 ENCOUNTER — Other Ambulatory Visit: Payer: Self-pay | Admitting: Family Medicine

## 2019-05-28 ENCOUNTER — Ambulatory Visit
Admission: RE | Admit: 2019-05-28 | Discharge: 2019-05-28 | Disposition: A | Discharge status: Home / Self Care | Payer: Medicaid Other | Source: Ambulatory Visit | Attending: Family Medicine | Admitting: Family Medicine

## 2019-05-28 ENCOUNTER — Ambulatory Visit: Payer: Medicaid Other

## 2019-05-28 ENCOUNTER — Other Ambulatory Visit: Payer: Self-pay

## 2019-05-28 DIAGNOSIS — N644 Mastodynia: Secondary | ICD-10-CM

## 2019-05-28 DIAGNOSIS — N6011 Diffuse cystic mastopathy of right breast: Secondary | ICD-10-CM

## 2019-05-28 DIAGNOSIS — N6012 Diffuse cystic mastopathy of left breast: Secondary | ICD-10-CM

## 2019-06-17 ENCOUNTER — Other Ambulatory Visit: Payer: Medicaid Other

## 2019-06-25 ENCOUNTER — Other Ambulatory Visit: Payer: Medicaid Other

## 2019-09-29 ENCOUNTER — Encounter (HOSPITAL_COMMUNITY): Payer: Medicaid Other

## 2019-09-30 ENCOUNTER — Encounter: Payer: Self-pay | Admitting: Dermatology

## 2019-09-30 ENCOUNTER — Ambulatory Visit (INDEPENDENT_AMBULATORY_CARE_PROVIDER_SITE_OTHER): Payer: Medicaid Other | Admitting: Dermatology

## 2019-09-30 ENCOUNTER — Other Ambulatory Visit: Payer: Self-pay

## 2019-09-30 DIAGNOSIS — L0291 Cutaneous abscess, unspecified: Secondary | ICD-10-CM

## 2019-09-30 DIAGNOSIS — L0201 Cutaneous abscess of face: Secondary | ICD-10-CM

## 2019-09-30 NOTE — Progress Notes (Signed)
   Follow-Up Visit   Subjective  Tara Bullock is a 34 y.o. female who presents for the following: Cyst.  Patient has a tender inflamed area at left side of face present for a few weeks. It has not drained and has not been treated.  The following portions of the chart were reviewed this encounter and updated as appropriate:  Tobacco  Allergies  Meds  Problems  Med Hx  Surg Hx  Fam Hx      Review of Systems:  No other skin or systemic complaints except as noted in HPI or Assessment and Plan.  Objective  Well appearing patient in no apparent distress; mood and affect are within normal limits.  A focused examination was performed including face. Relevant physical exam findings are noted in the Assessment and Plan.  Objective  Left preauricular: 0.7cm subcutaneous erythematous papule   Assessment & Plan  Abscess Left preauricular  Incision and Drainage - Left preauricular Location: left preauricular  Informed Consent: Discussed risks (permanent scarring, light or dark discoloration, infection, pain, bleeding, bruising, redness, damage to adjacent structures, and recurrence of the lesion) and benefits of the procedure, as well as the alternatives.  Informed consent was obtained.  Preparation: The area was prepped with alcohol.  Anesthesia: Lidocaine 1% with epinephrine.   Procedure Details: An incision was made overlying the lesion. The lesion drained pus.  A small amount of fluid was drained.    Antibiotic ointment and a sterile pressure dressing were applied. The patient tolerated procedure well.  Total number of lesions drained: 1  Plan: The patient was instructed on post-op care. Recommend OTC analgesia as needed for pain.   Anaerobic and Aerobic Culture - Left preauricular  No follow-ups on file.  Graciella Belton, RMA, am acting as scribe for Forest Gleason, MD .  Documentation: I have reviewed the above documentation for accuracy and  completeness, and I agree with the above.  Forest Gleason, MD

## 2019-10-02 ENCOUNTER — Other Ambulatory Visit (HOSPITAL_COMMUNITY): Payer: Self-pay | Admitting: *Deleted

## 2019-10-05 ENCOUNTER — Other Ambulatory Visit: Payer: Self-pay

## 2019-10-05 ENCOUNTER — Encounter (HOSPITAL_COMMUNITY)
Admission: RE | Admit: 2019-10-05 | Discharge: 2019-10-05 | Disposition: A | Discharge status: Home / Self Care | Payer: Medicaid Other | Source: Ambulatory Visit | Attending: Nephrology | Admitting: Nephrology

## 2019-10-05 VITALS — BP 174/75 | HR 84 | Temp 96.8°F | Resp 18

## 2019-10-05 DIAGNOSIS — N184 Chronic kidney disease, stage 4 (severe): Secondary | ICD-10-CM | POA: Insufficient documentation

## 2019-10-05 LAB — POCT HEMOGLOBIN-HEMACUE: Hemoglobin: 11.3 g/dL — ABNORMAL LOW (ref 12.0–15.0)

## 2019-10-05 MED ORDER — EPOETIN ALFA-EPBX 10000 UNIT/ML IJ SOLN
INTRAMUSCULAR | Status: AC
  Filled 2019-10-05: qty 2

## 2019-10-05 MED ORDER — EPOETIN ALFA 20000 UNIT/ML IJ SOLN: 15000.0000 [IU] | INTRAMUSCULAR | Status: DC

## 2019-10-05 MED ORDER — SODIUM CHLORIDE 0.9 % IV SOLN
510.0000 mg | INTRAVENOUS | Status: DC
  Administered 2019-10-05: 510 mg via INTRAVENOUS
  Filled 2019-10-05: qty 17

## 2019-10-05 MED ORDER — EPOETIN ALFA-EPBX 10000 UNIT/ML IJ SOLN
15000.0000 [IU] | INTRAMUSCULAR | Status: DC
  Administered 2019-10-05: 15000 [IU] via SUBCUTANEOUS

## 2019-10-12 ENCOUNTER — Encounter (HOSPITAL_COMMUNITY)
Admission: RE | Admit: 2019-10-12 | Discharge: 2019-10-12 | Disposition: A | Discharge status: Home / Self Care | Payer: Medicaid Other | Source: Ambulatory Visit | Attending: Nephrology | Admitting: Nephrology

## 2019-10-12 ENCOUNTER — Other Ambulatory Visit: Payer: Self-pay

## 2019-10-12 DIAGNOSIS — N184 Chronic kidney disease, stage 4 (severe): Secondary | ICD-10-CM | POA: Diagnosis not present

## 2019-10-12 MED ORDER — SODIUM CHLORIDE 0.9 % IV SOLN
510.0000 mg | INTRAVENOUS | Status: AC
  Administered 2019-10-12: 510 mg via INTRAVENOUS
  Filled 2019-10-12: qty 17

## 2019-10-12 MED ORDER — SODIUM CHLORIDE 0.9 % IV SOLN
510.0000 mg | INTRAVENOUS | Status: DC
  Filled 2019-10-12: qty 17

## 2019-10-12 NOTE — Progress Notes (Signed)
There was difficulty on patient's MAR documenting Feraheme dose.  I actually hung the bag at 0920 and manually programed the pump. There was no duplicate given.

## 2019-11-02 ENCOUNTER — Encounter (HOSPITAL_COMMUNITY)
Admission: RE | Admit: 2019-11-02 | Discharge: 2019-11-02 | Disposition: A | Discharge status: Home / Self Care | Payer: Medicaid Other | Source: Ambulatory Visit | Attending: Nephrology | Admitting: Nephrology

## 2019-11-02 ENCOUNTER — Other Ambulatory Visit: Payer: Self-pay

## 2019-11-02 VITALS — BP 195/87 | HR 92 | Temp 98.4°F | Resp 18

## 2019-11-02 DIAGNOSIS — N184 Chronic kidney disease, stage 4 (severe): Secondary | ICD-10-CM | POA: Diagnosis present

## 2019-11-02 LAB — IRON AND TIBC
Iron: 88 ug/dL (ref 28–170)
Saturation Ratios: 37 % — ABNORMAL HIGH (ref 10.4–31.8)
TIBC: 239 ug/dL — ABNORMAL LOW (ref 250–450)
UIBC: 151 ug/dL

## 2019-11-02 LAB — POCT HEMOGLOBIN-HEMACUE: Hemoglobin: 13 g/dL (ref 12.0–15.0)

## 2019-11-02 LAB — FERRITIN: Ferritin: 796 ng/mL — ABNORMAL HIGH (ref 11–307)

## 2019-11-02 MED ORDER — EPOETIN ALFA-EPBX 3000 UNIT/ML IJ SOLN
INTRAMUSCULAR | Status: AC
  Filled 2019-11-02: qty 1

## 2019-11-02 MED ORDER — EPOETIN ALFA-EPBX 2000 UNIT/ML IJ SOLN
INTRAMUSCULAR | Status: AC
  Filled 2019-11-02: qty 1

## 2019-11-02 MED ORDER — EPOETIN ALFA-EPBX 10000 UNIT/ML IJ SOLN
INTRAMUSCULAR | Status: AC
  Filled 2019-11-02: qty 1

## 2019-11-02 MED ORDER — EPOETIN ALFA-EPBX 10000 UNIT/ML IJ SOLN: 15000.0000 [IU] | INTRAMUSCULAR | Status: DC

## 2019-11-16 ENCOUNTER — Encounter (HOSPITAL_COMMUNITY)
Admission: RE | Admit: 2019-11-16 | Discharge: 2019-11-16 | Disposition: A | Discharge status: Home / Self Care | Payer: Medicaid Other | Source: Ambulatory Visit | Attending: Nephrology | Admitting: Nephrology

## 2019-11-16 ENCOUNTER — Other Ambulatory Visit: Payer: Self-pay

## 2019-11-16 VITALS — BP 175/88 | HR 93 | Temp 97.3°F | Resp 18

## 2019-11-16 DIAGNOSIS — N184 Chronic kidney disease, stage 4 (severe): Secondary | ICD-10-CM

## 2019-11-16 LAB — POCT HEMOGLOBIN-HEMACUE: Hemoglobin: 13.1 g/dL (ref 12.0–15.0)

## 2019-11-16 MED ORDER — EPOETIN ALFA-EPBX 10000 UNIT/ML IJ SOLN: 15000.0000 [IU] | INTRAMUSCULAR | Status: DC

## 2019-11-25 HISTORY — PX: KIDNEY TRANSPLANT: SHX239

## 2019-11-27 DIAGNOSIS — Z940 Kidney transplant status: Secondary | ICD-10-CM | POA: Insufficient documentation

## 2019-12-01 DIAGNOSIS — D849 Immunodeficiency, unspecified: Secondary | ICD-10-CM | POA: Insufficient documentation

## 2019-12-03 ENCOUNTER — Encounter (HOSPITAL_COMMUNITY): Payer: Medicaid Other

## 2019-12-10 DIAGNOSIS — Z87898 Personal history of other specified conditions: Secondary | ICD-10-CM | POA: Insufficient documentation

## 2019-12-10 DIAGNOSIS — Z79899 Other long term (current) drug therapy: Secondary | ICD-10-CM | POA: Insufficient documentation

## 2019-12-14 ENCOUNTER — Encounter (HOSPITAL_COMMUNITY): Payer: Medicaid Other

## 2019-12-29 DIAGNOSIS — I10 Essential (primary) hypertension: Secondary | ICD-10-CM | POA: Insufficient documentation

## 2020-01-13 DIAGNOSIS — Z79899 Other long term (current) drug therapy: Secondary | ICD-10-CM | POA: Insufficient documentation

## 2020-09-29 ENCOUNTER — Ambulatory Visit: Payer: Medicaid Other | Admitting: Dermatology

## 2020-09-29 ENCOUNTER — Other Ambulatory Visit: Payer: Self-pay

## 2020-09-29 ENCOUNTER — Encounter: Payer: Self-pay | Admitting: Dermatology

## 2020-09-29 DIAGNOSIS — L821 Other seborrheic keratosis: Secondary | ICD-10-CM | POA: Diagnosis not present

## 2020-09-29 DIAGNOSIS — L659 Nonscarring hair loss, unspecified: Secondary | ICD-10-CM

## 2020-09-29 DIAGNOSIS — L820 Inflamed seborrheic keratosis: Secondary | ICD-10-CM | POA: Diagnosis not present

## 2020-09-29 DIAGNOSIS — L988 Other specified disorders of the skin and subcutaneous tissue: Secondary | ICD-10-CM | POA: Diagnosis not present

## 2020-09-29 DIAGNOSIS — L650 Telogen effluvium: Secondary | ICD-10-CM | POA: Diagnosis not present

## 2020-09-29 NOTE — Progress Notes (Signed)
Follow-Up Visit   Subjective  Tara Bullock is a 35 y.o. 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 States that it is growing back in but very slowly. Pt taking otc hair, skin, and nail with zinc vitamins. She wants to know what else can help the process speed up. Pt also wants to talk about starting a retinoid. Pt has spots on her back and left hip that she would like checked today. States that the spot on her back rubs against her bra and gets itchy. ).  The following portions of the chart were reviewed this encounter and updated as appropriate:  Tobacco  Allergies  Meds  Problems  Med Hx  Surg Hx  Fam Hx      Review of Systems: No other skin or systemic complaints except as noted in HPI or Assessment and Plan.   Objective  Well appearing patient in no apparent distress; mood and affect are within normal limits.  A focused examination was performed including scalp, face, back, left hip. Relevant physical exam findings are noted in the Assessment and Plan.   Scalp Widespread thinning Positive hair pull test  right mid back Erythematous keratotic or waxy stuck-on papule or plaque.   Head - Anterior (Face) Rhytides and volume loss.   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 This should ideally be used twice daily for best results but it helps with hair regrowth when used at least three times per week. Rogaine initially can cause increased hair shedding for the first few weeks but this will stop with continued use. In studies, people who used minoxidil (Rogaine) for at least 6 months had thicker hair than people who did not. Minoxidil  topical (Rogaine) only works as long as it continues to be used. If if it is no longer used then the hair it has been helping to regrow can fall out. Minoxidil topical (Rogaine) can cause increased facial hair growth which can usually be managed easily with a battery-operated hair trimmer. If facial hair growth is bothersome, switching to the 2% women's version can decrease the risk of unwanted facial hair growth.  Pt does not want to start due to potential facial hair growth.    Ferritin - Scalp  Vitamin D, 25-hydroxy - Scalp  Inflamed seborrheic keratosis right mid back  Symptomatic  Prior to procedure, discussed risks of blister formation, small wound, skin dyspigmentation, or rare scar following cryotherapy. Recommend Vaseline ointment to treated areas while healing.   Destruction of lesion - right mid back  Destruction method: cryotherapy   Informed consent: discussed and consent obtained   Lesion destroyed using liquid nitrogen: Yes   Outcome: patient tolerated procedure well with no complications   Post-procedure details: wound care instructions given    Elastosis of skin Head - Anterior (Face)  Discussed Rx perfect A (tretinoin 0.1% vitamin C) vs skin medicinals cream (0.025 or 0.05% tretinoin with vit C, hyaluronic acid, resveratrol)  Pt wants to try Perfect A. Will prescribe. Apply a pea sized amount to face at night. Start every other night for approx 2 weeks and then can increase to every night as tolerated.  Topical retinoid medications like tretinoin/Retin-A, adapalene/Differin, tazarotene/Fabior, and Epiduo/Epiduo Forte can cause dryness and irritation when first started. Only apply a pea-sized amount to the entire affected area. Avoid applying it around the eyes, edges of mouth and creases at the nose. If you experience irritation, use a good moisturizer first and/or apply the medicine less often. If you are doing well with the medicine, you can increase how often you  use it until you are applying every night. Be careful with sun protection while using this medication as it can make you sensitive to the sun. This medicine should not be used by pregnant women.    Seborrheic Keratoses - Stuck-on, waxy, tan-brown papules and/or plaques  - Benign-appearing - Discussed benign etiology and prognosis. - Observe - Call for any changes  Return for 4-8 week recheck ISK, alopecia.  I, Harriett Sine, CMA, am acting as scribe for Forest Gleason, MD. Documentation: I have reviewed the above documentation for accuracy and completeness, and I agree with the above.  Forest Gleason, MD

## 2020-09-29 NOTE — Patient Instructions (Addendum)
Cryotherapy Aftercare  Wash gently with soap and water everyday.   Apply Vaseline and Band-Aid daily until healed.    Topical Retinoids  Your dermatologist has prescribed a topical retinoid. These medications are commonly used to treat acne, as well as a variety of other conditions. Retinoids are derivatives of vitamin A and include tretinoin, Retin-A, Atralin, Ziana, Veltin, Tretin-X, Avita, Renova, adapalene, Differin, Epiduo, and Tazorac.  This cream or gel is the most important component of an acne treatment regimen. It is the only class of medications that makes a difference in the long run in treating acne.  It will help you clear up and, more importantly, keep you clear.  It works slowly to decrease clogging of pores by preventing the collection of dead skin cells and oil in the pores.  In order for your acne to improve, it is essential that you read the information below so that it is used properly. Failure to use this medication properly will make it very difficult for your condition to improve.  It is recommended you use this medication for as long as you are acne-prone.    Do not use a topical retinoid if you are pregnant or planning to become pregnant.   Retinoids work by preventing the clogging of pores. Because they are much more effective at preventing acne, they are not recommended as a spot treatment. The entire area in which pimples appear must be treated.  Retinoids are usually applied at night because the sunlight may inactivate some preparations.  3)  These medications are powerful, and only a pea-sized amount at bedtime is needed to treat the entire face. Using more medication will not make it work faster, but will lead to additional irritation. Proper application of a pea-sized amount to your face can be achieved by dotting the central forehead, nose, and chin, and then spreading these smaller portions left and right to cover the sides of the forehead and cheeks.  4)   Retinoid medications work on the microscopic clogged pores to prevent future pimple formation. Because of this, it may take several months to see the effects of the medication.    5)  Some patients notice more breakouts in the first few weeks of treatment with topical retinoids. It is important to continue the medication through the initial flare in order to allow the acne to clear later on.  6)  Side effects are common and include redness, soreness, itchiness, burning, peeling, and irritation. The area around the eyes and mouth are particularly sensitive to retinoids and irritation is more common in these areas. These symptoms are more common if more than a pea-sized amount is used.  7) You will note some dryness of your skin while on this medication, and so moisturizing the skin is helpful. Apply a moisturizer to your face prior to or just after applying the retinoid. It will not interfere with the medication.  You can also moisturize any time your face feels dry. Moisturizers with sunscreen (SPF 15 or higher) are recommended for during the day as some patients get sunburned more quickly while using this medication.  8) If you develop significant irritation and redness, stop the medication for a day or two so that your skin recovers and apply a moisturizer as needed. Then restart this medication on an every other night schedule until your skin adjusts and you can tolerate it every night.  9) Do not apply any benzoyl peroxide products (creams, gels, or cleansers) at nighttime as they may  interfere with the retinoid. Use them in the morning.    10) Avoid harsh cleansers as they promote drying and irritation. Gentle cleansers are recommended unless instructed otherwise by your dermatologist. Examples of gentle skin cleansers include Purpose, Cetaphil, Aveeno, and CeraVe.  11) Stop the topical retinoid one week prior to any procedures such as waxing, facials, and chemical peels or travel to sunny  destinations such as the beach or windy environments such as ski slopes.      If you have any questions or concerns for your doctor, please call our main line at [phone] and press option 4 to reach your doctor's medical assistant. If no one answers, please leave a voicemail as directed and we will return your call as soon as possible. Messages left after 4 pm will be answered the following business day.   You may also send [place] a message via [place]. We typically respond to MyChart messages within 1-2 business days.  For prescription refills, please ask your pharmacy to contact our office. Our fax number is [phone].  If you have an urgent issue when the clinic is closed that cannot wait until the next business day, you can page your doctor at the number below.    Please note that while we do our best to be available for urgent issues outside of office hours, we are not available 24/7.   If you have an urgent issue and are unable to reach [place], you may choose to seek medical care at your doctor's office, retail clinic, urgent care center, or emergency room.  If you have a medical emergency, please immediately call 911 or go to the emergency department.  Pager Numbers  - Dr. Nehemiah Massed: [phone]  - Dr. Laurence Ferrari: [phone]  - Dr. Nicole Kindred: [phone]  In the event of inclement weather, please call our main line at [phone] for an update on the status of any delays or closures.  Dermatology Medication Tips: Please keep the boxes that topical medications come in in order to help keep track of the instructions about where and how to use these. Pharmacies typically print the medication instructions only on the boxes and not directly on the medication tubes.   If your medication is too expensive, please contact our office at [phone] option 4 or send [place] a message through [place].   We are unable to tell what your co-pay for medications will be in advance as this is different  depending on your insurance coverage. However, we may be able to find a substitute medication at lower cost or fill out paperwork to get insurance to cover a needed medication.   If a prior authorization is required to get your medication covered by your insurance company, please allow [place] 1-2 business days to complete this process.  Drug prices often vary depending on where the prescription is filled and some pharmacies may offer cheaper prices.  The website www.goodrx.com contains coupons for medications through different pharmacies. The prices here do not account for what the cost may be with help from insurance (it may be cheaper with your insurance), but the website can give you the price if you did not use any insurance.  - You can print the associated coupon and take it with your prescription to the pharmacy.  - You may also stop by our office during regular business hours and pick up a GoodRx coupon card.  - If you need your prescription sent electronically to a different pharmacy, notify our office through [facility]  MyChart or by phone at [phone] option 4.

## 2020-11-03 ENCOUNTER — Ambulatory Visit: Payer: Medicaid Other | Admitting: Dermatology

## 2020-11-30 ENCOUNTER — Ambulatory Visit: Payer: Medicaid Other | Admitting: Dermatology

## 2020-11-30 ENCOUNTER — Encounter: Payer: Self-pay | Admitting: Dermatology

## 2020-11-30 ENCOUNTER — Other Ambulatory Visit: Payer: Self-pay

## 2020-11-30 DIAGNOSIS — L820 Inflamed seborrheic keratosis: Secondary | ICD-10-CM | POA: Diagnosis not present

## 2020-11-30 DIAGNOSIS — L650 Telogen effluvium: Secondary | ICD-10-CM

## 2020-11-30 NOTE — Progress Notes (Signed)
   Follow-Up Visit   Subjective  Tara Bullock is a 35 y.o. female who presents for the following: Follow-up (Patient here today for follow up on alopecial. Patient reports losing some lab work. Patient froze at back and a new spot that has developed at chest she would like checked, ).  The following portions of the chart were reviewed this encounter and updated as appropriate:  Tobacco  Allergies  Meds  Problems  Med Hx  Surg Hx  Fam Hx      Review of Systems: No other skin or systemic complaints except as noted in HPI or Assessment and Plan.   Objective  Well appearing patient in no apparent distress; mood and affect are within normal limits.  A focused examination was performed including scalp, back, mid chest . Relevant physical exam findings are noted in the Assessment and Plan.  mid chest x 1, right lower back x 1 (2) Erythematous keratotic or waxy stuck-on papule or plaque.   Scalp Diffuse thinning with positive hair pull test  Assessment & Plan  Inflamed seborrheic keratosis mid chest x 1, right lower back x 1  Prior to procedure, discussed risks of blister formation, small wound, skin dyspigmentation, or rare scar following cryotherapy. Recommend Vaseline ointment to treated areas while healing.   Destruction of lesion - mid chest x 1, right lower back x 1  Destruction method: cryotherapy   Informed consent: discussed and consent obtained   Lesion destroyed using liquid nitrogen: Yes   Cryotherapy cycles:  2 Outcome: patient tolerated procedure well with no complications   Post-procedure details: wound care instructions given   Additional details:  Prior to procedure, discussed risks of blister formation, small wound, skin dyspigmentation, or rare scar following cryotherapy. Recommend Vaseline ointment to treated areas while healing.   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 This should ideally be used twice daily for best results but it helps with hair regrowth when used at least three times per week. Rogaine initially can cause increased hair shedding for the first few weeks but this will stop with continued use. In studies, people who used minoxidil (Rogaine) for at least 6 months had thicker hair than people who did not. Minoxidil topical (Rogaine) only works as long as it continues to be used. If if it is no longer used then the hair it has been helping to regrow can fall out. Minoxidil topical (Rogaine) can cause increased facial hair growth which can usually be managed easily with a battery-operated hair trimmer. If facial hair growth is bothersome, switching to the 2% women's version can decrease the risk of unwanted facial hair growth.   Pt defers today due to potential facial hair growth.   Ferritin - Scalp  Vitamin D, 25-hydroxy - Scalp  Return for 3 month alopecia and isk follow up. I, Ruthell Rummage, CMA, am acting as scribe for Forest Gleason, MD.  Documentation: I have reviewed the above documentation for accuracy and completeness, and I agree with the above.  Forest Gleason, MD

## 2020-11-30 NOTE — Patient Instructions (Addendum)
Cryotherapy Aftercare  Wash gently with soap and water everyday.   Apply Vaseline and Band-Aid daily until healed.    Recommend minoxidil 5% (Rogaine for men) solution or foam to be applied to the scalp and left in. This should ideally be used twice daily for best results but it helps with hair regrowth when used at least three times per week. Rogaine initially can cause increased hair shedding for the first few weeks but this will stop with continued use. In studies, people who used minoxidil (Rogaine) for at least 6 months had thicker hair than people who did not. Minoxidil topical (Rogaine) only works as long as it continues to be used. If if it is no longer used then the hair it has been helping to regrow can fall out. Minoxidil topical (Rogaine) can cause increased facial hair growth which can usually be managed easily with a battery-operated hair trimmer. If facial hair growth is bothersome, switching to the 2% women's version can decrease the risk of unwanted facial hair growth.  If You Need Anything After Your Visit  If you have any questions or concerns for your doctor, please call our main line at [phone] and press option 4 to reach your doctor's medical assistant. If no one answers, please leave a voicemail as directed and we will return your call as soon as possible. Messages left after 4 pm will be answered the following business day.   You may also send [place] a message via [place]. We typically respond to MyChart messages within 1-2 business days.  For prescription refills, please ask your pharmacy to contact our office. Our fax number is [phone].  If you have an urgent issue when the clinic is closed that cannot wait until the next business day, you can page your doctor at the number below.    Please note that while we do our best to be available for urgent issues outside of office hours, we are not available 24/7.   If you have an urgent issue and are unable to reach  [place], you may choose to seek medical care at your doctor's office, retail clinic, urgent care center, or emergency room.  If you have a medical emergency, please immediately call 911 or go to the emergency department.  Pager Numbers  - Dr. Nehemiah Massed: [phone]  - Dr. Laurence Ferrari: [phone]  - Dr. Nicole Kindred: [phone]  In the event of inclement weather, please call our main line at [phone] for an update on the status of any delays or closures.  Dermatology Medication Tips: Please keep the boxes that topical medications come in in order to help keep track of the instructions about where and how to use these. Pharmacies typically print the medication instructions only on the boxes and not directly on the medication tubes.   If your medication is too expensive, please contact our office at [phone] option 4 or send [place] a message through [place].   We are unable to tell what your co-pay for medications will be in advance as this is different depending on your insurance coverage. However, we may be able to find a substitute medication at lower cost or fill out paperwork to get insurance to cover a needed medication.   If a prior authorization is required to get your medication covered by your insurance company, please allow [place] 1-2 business days to complete this process.  Drug prices often vary depending on where the prescription is filled and some pharmacies may offer cheaper prices.  The website  www.goodrx.com contains coupons for medications through different pharmacies. The prices here do not account for what the cost may be with help from insurance (it may be cheaper with your insurance), but the website can give you the price if you did not use any insurance.  - You can print the associated coupon and take it with your prescription to the pharmacy.  - You may also stop by our office during regular business hours and pick up a GoodRx coupon card.  - If you need your prescription sent  electronically to a different pharmacy, notify our office through [facility] or by phone at [phone] option 4.     Si Usted Necesita Algo Despus de Su Visita  Tambin puede enviarnos un mensaje a travs de Pharmacist, community. Por lo general respondemos a los mensajes de MyChart en el transcurso de 1 a 2 das hbiles.  Para renovar recetas, por favor pida a su farmacia que se ponga en contacto con nuestra oficina. Harland Dingwall de fax es [place] [phone].  Si tiene un asunto urgente cuando la clnica est cerrada y que no puede esperar hasta el siguiente da hbil, puede llamar/localizar a su doctor(a) al nmero que aparece a continuacin.   Por favor, tenga en cuenta que aunque hacemos todo lo posible para estar disponibles para asuntos urgentes fuera del horario de [place], no estamos disponibles las 24 horas del da, los 7 das de la [place].   Si tiene un problema urgente y no puede comunicarse con nosotros, puede optar por buscar atencin mdica  en el consultorio de su doctor(a), en una clnica privada, en un centro de atencin urgente o en una sala de emergencias.  Si tiene Engineering geologist, por favor llame inmediatamente al 911 o vaya a la sala de emergencias.  Nmeros de bper  - Dr. Nehemiah Massed: [phone]  - Dra. Moye: [phone]  - Dra. Nicole Kindred: [phone]  En caso de inclemencias del [place], por favor llame a Johnsie Kindred principal al [phone] para una actualizacin sobre el [place] de cualquier retraso o cierre.  Consejos para la medicacin en dermatologa: Por favor, guarde las cajas en las que vienen los medicamentos de uso tpico para ayudarle a seguir las instrucciones sobre dnde y cmo usarlos. Las farmacias generalmente imprimen las instrucciones del medicamento slo en las cajas y no directamente en los tubos del [place].   Si su medicamento es muy caro, por favor, pngase en contacto con Zigmund Daniel llamando al [phone] y presione la  opcin 4 o envenos un mensaje a travs de Pharmacist, community.   No podemos decirle cul ser su copago por los medicamentos por adelantado ya que esto es diferente dependiendo de la cobertura de su seguro. Sin embargo, es posible que podamos encontrar un medicamento sustituto a Electrical engineer un formulario para que el seguro cubra el medicamento que se considera necesario.   Si se requiere una autorizacin previa para que su compaa de seguros [place] su medicamento, por favor permtanos de 1 a 2 das hbiles para completar este proceso.  Los precios de los medicamentos varan con frecuencia dependiendo del Environmental consultant de dnde se surte la receta y alguna farmacias pueden ofrecer precios ms baratos.  El sitio web www.goodrx.com tiene cupones para medicamentos de Airline pilot. Los precios aqu no tienen en cuenta lo que podra costar con la ayuda del seguro (puede ser ms barato con su seguro), pero el sitio web puede darle el precio si no utiliz Research scientist (physical sciences).  - Puede imprimir  imprimir el cupn correspondiente y llevarlo con su receta a la farmacia.  - Tambin puede pasar por nuestra oficina durante el horario de atencin regular y recoger una tarjeta de cupones de GoodRx.  - Si necesita que su receta se enve electrnicamente a una farmacia diferente, informe a nuestra oficina a travs de MyChart de [place] o por telfono llamando al [phone] y presione la opcin 4.  

## 2020-12-27 NOTE — Progress Notes (Unsigned)
Patient presents as a walk in. 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 Donnetta Hutching. Patient has never received dialysis in that arm and received a kidney transplant in 2021. Left hand was warm and swollen and there was a large warm reddened patch on her upper arm. Arm very tender. Patient had palpable radial pulse and no loss of grip or sensation. Problem has been ongoing for about 3 days. Arlee Muslim PA examined patient as well, likely phlebitis from vein clotting off. Advised patient to take tylenol and try warm compresses or ice based on her comfort. Instructed patient to call in a week if not improving.

## 2021-02-03 ENCOUNTER — Other Ambulatory Visit: Payer: Self-pay | Admitting: Nephrology

## 2021-02-06 ENCOUNTER — Other Ambulatory Visit: Payer: Self-pay | Admitting: Nephrology

## 2021-02-07 ENCOUNTER — Other Ambulatory Visit (HOSPITAL_COMMUNITY): Payer: Self-pay | Admitting: Nephrology

## 2021-02-07 DIAGNOSIS — I770 Arteriovenous fistula, acquired: Secondary | ICD-10-CM

## 2021-02-16 ENCOUNTER — Encounter (HOSPITAL_COMMUNITY): Payer: Self-pay | Admitting: *Deleted

## 2021-02-23 ENCOUNTER — Ambulatory Visit: Payer: Medicaid Other | Admitting: Dermatology

## 2021-02-24 ENCOUNTER — Ambulatory Visit (HOSPITAL_COMMUNITY)
Admission: RE | Admit: 2021-02-24 | Discharge: 2021-02-24 | Disposition: A | Discharge status: Home / Self Care | Payer: Medicaid Other | Source: Ambulatory Visit | Attending: Nephrology | Admitting: Nephrology

## 2021-02-24 ENCOUNTER — Other Ambulatory Visit: Payer: Self-pay

## 2021-02-24 DIAGNOSIS — I770 Arteriovenous fistula, acquired: Secondary | ICD-10-CM | POA: Insufficient documentation

## 2021-03-08 ENCOUNTER — Telehealth: Payer: Self-pay

## 2021-03-08 NOTE — Telephone Encounter (Signed)
Tried to call patient to reschedule appt with Dr. Laurence Ferrari. N/A and VM full. ?Lurlean Horns., RMA ?

## 2021-03-13 ENCOUNTER — Telehealth: Payer: Self-pay

## 2021-03-13 NOTE — Telephone Encounter (Signed)
Patient called and left message she is sick and would like to reschedule appt tomorrow. Tried calling patient but no answer and VM is full. ?Lurlean Horns., RMA ?

## 2021-03-14 ENCOUNTER — Ambulatory Visit: Payer: Self-pay | Admitting: Dermatology

## 2021-03-25 ENCOUNTER — Other Ambulatory Visit: Payer: Self-pay

## 2021-03-25 DIAGNOSIS — N179 Acute kidney failure, unspecified: Secondary | ICD-10-CM

## 2021-04-03 ENCOUNTER — Ambulatory Visit (HOSPITAL_COMMUNITY)
Admission: RE | Admit: 2021-04-03 | Discharge: 2021-04-03 | Disposition: A | Discharge status: Home / Self Care | Payer: Medicaid Other | Source: Ambulatory Visit | Attending: Vascular Surgery | Admitting: Vascular Surgery

## 2021-04-03 ENCOUNTER — Ambulatory Visit: Payer: Medicaid Other | Admitting: Vascular Surgery

## 2021-04-03 ENCOUNTER — Encounter: Payer: Self-pay | Admitting: Vascular Surgery

## 2021-04-03 ENCOUNTER — Encounter (HOSPITAL_COMMUNITY): Payer: Self-pay

## 2021-04-03 VITALS — BP 137/81 | HR 102 | Temp 98.3°F | Resp 14 | Ht 61.0 in | Wt 162.0 lb

## 2021-04-03 DIAGNOSIS — T82868S Thrombosis of vascular prosthetic devices, implants and grafts, sequela: Secondary | ICD-10-CM | POA: Diagnosis not present

## 2021-04-03 DIAGNOSIS — T82868A Thrombosis of vascular prosthetic devices, implants and grafts, initial encounter: Secondary | ICD-10-CM | POA: Diagnosis not present

## 2021-04-03 DIAGNOSIS — N179 Acute kidney failure, unspecified: Secondary | ICD-10-CM

## 2021-04-03 NOTE — Progress Notes (Signed)
?Office Note  ? ? ? ?CC: Thrombosed left-sided brachiocephalic fistula ?Requesting Provider:  Rosita Fire, * ? ?HPI: Tara Bullock is a 36 y.o. (1985/08/09) female presenting at the request of Dr. Carolin Sicks for consideration of fistula declot. ? ?Tara Bullock presents today accompanied by her husband.  She had a previous left-sided brachiocephalic fistula placed in 2020 with Dr. Curt Jews.  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  In December of last year, she presented to the office with erythema and pain at the previous fistula site.  Ultrasound demonstrated thrombosis of the fistula. ? ?She presents today at the request of Dr. Carolin Sicks to discuss possible fistula declot.  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  ? ?The pt is not on a statin for cholesterol management.  ?The pt is not on a daily aspirin.   Other AC:  - ?The pt is  on medication for hypertension.   ?The pt is no diabetic.  ?Tobacco hx:  - ? ?Past Medical History:  ?Diagnosis Date  ? Anemia   ? Anorexia nervosa   ? Anxiety   ? Arthritis   ? osteoarthritis and Rhematoid factor positive  ? Chronic kidney disease   ? Stage 4  ? Depression   ? Fibromyalgia   ? GERD (gastroesophageal reflux disease)   ? Headache   ? pt denies  ? History of hydronephrosis   ? left ureteral placement 04-02-2015  resolved  ? History of kidney stones   ? Hypertension   ? IBS (irritable bowel syndrome)   ? Insomnia   ? Neuromuscular disorder ([place])   ? Pneumonia   ? Renal cyst, left   ? Retained ureteral stent   ? left  ? Rheumatoid factor positive   ? 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 last A1c  12.9 on 04-02-2015  ? Wears glasses   ? ? ?Past Surgical History:  ?Procedure Laterality Date  ? AV FISTULA PLACEMENT Left 01/17/2018  ? Procedure: ARTERIOVENOUS (AV) FISTULA CREATION;  Surgeon:  [name], MD;  Location: [facility];  Service: Vascular;  Laterality: Left;  ? BREAST BIOPSY Right   ? [place]  ? BREAST EXCISIONAL BIOPSY Left   ? FA  ? CYSTOSCOPY W/ URETERAL STENT REMOVAL Left 05/31/2015  ? Procedure: CYSTOSCOPY WITH LEFT URETERAL  STENT REMOVAL,AND  RETROGRADE PYELOGRAM;  Surgeon: Nickie Retort, MD;  Location: [facility];  Service: Urology;  Laterality: Left;  ? CYSTOSCOPY WITH STENT PLACEMENT Left 04/02/2015  ? Procedure: CYSTOSCOPY, RETROGRADE PYELOGRAM WITH LEFT STENT PLACEMENT;  Surgeon: Nickie Retort, MD;  Location: [facility];  Service: Urology;  Laterality: Left;  ? LEEP  2009  ? LEEP    ? PARS PLANA VITRECTOMY Left 12/16/2015  ? Procedure: LEFT EYE PARS PLANA VITRECTOMY WITH ENDO LASER;  Surgeon: Jalene Mullet, MD;  Location: [facility];  Service: Ophthalmology;  Laterality: Left;  ? PARS PLANA VITRECTOMY Right 12/04/2016  ? Procedure: PARS PLANA VITRECTOMY WITH 25 GAUGE WITH ENDO LASER;  Surgeon: Jalene Mullet, MD;  Location: [facility];  Service: Ophthalmology;  Laterality: Right;  ? 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 Procedure: TRACTION RETINAL DETACHMENT REPAIR, PARS PLANA  VITRECTOMY, MEMBRANE PEELING, FLUID GAS EXCHANGE AND MEMBRANECT0MY WITH 16% SF6 GAS TAMPONADE. RIGHT EYE;  Surgeon: Jalene Mullet, MD;  Location: [facility];  Service: Ophthalmology;  Laterality:  Right;  ? ? ?Social History  ? ?Socioeconomic History  ? Marital status: Single  ?  Spouse name: Not on file  ? Number of children: Not on file  ? Years of education: Not on file  ? Highest education level: Not on file  ?Occupational History  ? Not on file  ?Tobacco Use  ? Smoking status: Former  ?  Packs/day: 1.00  ?  Years: 15.00  ?  Pack years: 15.00  ?  Types: Cigarettes  ?  Quit date: 07/01/2016  ?  Years since quitting: 4.7  ? Smokeless tobacco: Never  ? Tobacco comments:  ?  last use January 10, 2018  ?Vaping Use  ? Vaping Use: Never used  ?Substance and Sexual Activity  ? Alcohol use: No  ? Drug use:  No  ? Sexual activity: Yes  ?  Birth control/protection: I.U.D.  ?  Comment: mirena iud placed Oct 2013  ?Other Topics Concern  ? Not on file  ?Social History Narrative  ? Not on file  ? ?Social Determinants of Health  ? ?Financial Resource Strain: Not on file  ?Food Insecurity: Not on file  ?Transportation Needs: Not on file  ?Physical Activity: Not on file  ?Stress: Not on file  ?Social Connections: Not on file  ?Intimate Partner Violence: Not on file  ? ?Family History  ?Problem Relation Age of Onset  ? Hypertension Mother   ? Hypertension Father   ? ? ?Current Outpatient Medications  ?Medication Sig Dispense Refill  ? ALPRAZolam (XANAX) 0.5 MG tablet Take 0.5 mg by mouth 3 (three) times daily as needed for sleep or anxiety.     ? ARIPiprazole (ABILIFY) 5 MG tablet Take 5 mg by mouth daily.     ? calcitRIOL (ROCALTROL) 0.25 MCG capsule Take 0.25 mcg by mouth daily.     ? cyclobenzaprine (FLEXERIL) 10 MG tablet Take 10 mg by mouth 3 (three) times daily as needed for muscle spasms.     ? diltiazem (DILACOR XR) 120 MG 24 hr capsule Take 120 mg by mouth daily.     ? doxepin (SINEQUAN) 10 MG capsule Take 10 mg by mouth at bedtime as needed (sleep).    ? doxepin (SINEQUAN) 25 MG capsule Take 25 mg by mouth at bedtime as needed (sleep).     ? DULoxetine (CYMBALTA) 60 MG capsule Take 120 mg by mouth daily.    ? fluticasone (FLONASE ALLERGY RELIEF) 50 MCG/ACT nasal spray Place 1 spray into both nostrils daily. (Patient taking differently: Place 1 spray into both nostrils daily as needed for allergies.) 16 g 2  ? furosemide (LASIX) 40 MG tablet Take 40 mg by mouth daily.     ? gabapentin (NEURONTIN) 300 MG capsule Take 600 mg by mouth 2 (two) times daily.   1  ? hydrALAZINE (APRESOLINE) 50 MG tablet Take 50 mg by mouth 3 (three) times daily.    ? hyoscyamine (LEVSIN SL) 0.125 MG SL tablet Place 0.125 mg under the tongue every 4 (four) hours as needed for cramping.     ? insulin aspart (NOVOLOG) 100 UNIT/ML injection Inject  1-4 Units into the skin 3 (three) times daily with meals.     ? ISOtretinoin (ACCUTANE) 40 MG capsule Take 40 mg by mouth daily.    ? lamoTRIgine (LAMICTAL) 200 MG tablet Take 400 mg by mouth at bedtime.     ? LEVEMIR FLEXTOUCH 100 UNIT/ML Pen Inject 18 Units into the skin every morning.   5  ?  levonorgestrel (MIRENA) 20 MCG/24HR IUD 1 each by Intrauterine route once.     ? losartan (COZAAR) 100 MG tablet Take 100 mg by mouth daily.    ? metaxalone (SKELAXIN) 800 MG tablet Take 800 mg by mouth 3 (three) times daily as needed for muscle spasms.    ? Probiotic Product (PROBIOTIC DAILY PO) Take 1 capsule by mouth daily.    ? promethazine (PHENERGAN) 25 MG tablet Take 25 mg by mouth every 6 (six) hours as needed for nausea or vomiting.     ? traMADol (ULTRAM) 50 MG tablet Take 100 mg by mouth 3 (three) times daily.  0  ? cephALEXin (KEFLEX) 250 MG capsule Take 1 capsule (250 mg total) by mouth 2 (two) times daily. (Patient not taking: Reported on 04/03/2021) 10 capsule 0  ? ferrous sulfate 325 (65 FE) MG tablet Take 325 mg by mouth 2 (two) times daily with a meal. (Patient not taking: Reported on 04/03/2021)    ? ?No current facility-administered medications for this visit.  ? ? ?Allergies  ?Allergen Reactions  ? Ivp Dye [Iodinated Contrast Media] Anaphylaxis and Hives  ? Lisinopril Cough  ? Prednisone   ?  Increases blood sugar  ? Adhesive [Tape] Hives and Rash  ? ? ? ?REVIEW OF SYSTEMS:  ?'[X]'$  denotes positive finding, '[ ]'$  denotes negative finding ?Cardiac  Comments:  ?Chest pain or chest pressure:    ?Shortness of breath upon exertion:    ?Short of breath when lying flat:    ?Irregular heart rhythm:    ?    ?Vascular    ?Pain in calf, thigh, or hip brought on by ambulation:    ?Pain in feet at night that wakes you up from your sleep:     ?Blood clot in your veins:    ?Leg swelling:     ?    ?Pulmonary    ?Oxygen at home:    ?Productive cough:     ?Wheezing:     ?    ?Neurologic    ?Sudden weakness in arms or legs:      ?Sudden numbness in arms or legs:     ?Sudden onset of difficulty speaking or slurred speech:    ?Temporary loss of vision in one eye:     ?Problems with dizziness:     ?    ?Gastrointestinal    ?[place]

## 2021-07-24 DIAGNOSIS — S92323A Displaced fracture of second metatarsal bone, unspecified foot, initial encounter for closed fracture: Secondary | ICD-10-CM | POA: Insufficient documentation

## 2021-08-15 ENCOUNTER — Ambulatory Visit: Payer: Medicaid Other | Admitting: Dermatology

## 2021-08-24 ENCOUNTER — Encounter: Payer: Self-pay | Admitting: Dermatology

## 2021-08-24 ENCOUNTER — Ambulatory Visit: Payer: Medicaid Other | Admitting: Dermatology

## 2021-08-24 DIAGNOSIS — D489 Neoplasm of uncertain behavior, unspecified: Secondary | ICD-10-CM

## 2021-08-24 DIAGNOSIS — D2239 Melanocytic nevi of other parts of face: Secondary | ICD-10-CM

## 2021-08-24 DIAGNOSIS — L988 Other specified disorders of the skin and subcutaneous tissue: Secondary | ICD-10-CM

## 2021-08-24 DIAGNOSIS — B078 Other viral warts: Secondary | ICD-10-CM | POA: Diagnosis not present

## 2021-08-24 NOTE — Progress Notes (Signed)
Follow-Up Visit   Subjective  Tara Bullock is a 36 y.o. female who presents for the following: lesion (Check spots on L-3 finger and nose. Dur: years. No hx of treatment. Constant. Non tender, denies itch).  Would like to discuss "lip flip"   The following portions of the chart were reviewed this encounter and updated as appropriate:  Tobacco  Allergies  Meds  Problems  Med Hx  Surg Hx  Fam Hx      Review of Systems: No other skin or systemic complaints except as noted in HPI or Assessment and Plan.   Objective  Well appearing patient in no apparent distress; mood and affect are within normal limits.  A focused examination was performed including face, hands. Relevant physical exam findings are noted in the Assessment and Plan.  Lips Rhytides and volume loss.          Left Proximal 3rd Finger x1 Verrucous papules -- Discussed viral etiology and contagion.   Left ala 0.2 cm pink flesh papule      Assessment & Plan  Elastosis of skin Lips  Lip flip usually only lasts 1 month.  Botox 10 units injected as noted below.   No personal Hx of oral/perioral HSV  Botox Injection - Lips Location: See attached image  Informed consent: Discussed risks (infection, pain, bleeding, bruising, swelling, allergic reaction, paralysis of nearby muscles, eyelid droop, double vision, neck weakness, difficulty breathing, headache, undesirable cosmetic result, and need for additional treatment) and benefits of the procedure, as well as the alternatives.  Informed consent was obtained.  Preparation: The area was cleansed with alcohol.  Procedure Details:  Botox was injected into the dermis with a 30-gauge needle. Pressure applied to any bleeding. Ice packs offered for swelling.  Lot Number:  [id number] Expiration:  11/2023   Total Units Injected:  10   Plan: Patient was instructed to remain upright for 4 hours. Patient was instructed to avoid massaging the  face and avoid vigorous exercise for the rest of the day. Tylenol may be used for headache.  Allow 2 weeks before returning to clinic for additional dosing as needed. Patient will call for any problems.   Other viral warts Left Proximal 3rd Finger x1  Discussed viral etiology and risk of spread.  Discussed multiple treatments may be required to clear warts.  Discussed possible post-treatment dyspigmentation and risk of recurrence.  Cantharidin Plus is a blistering agent that comes from a beetle.  It needs to be washed off in about 4 hours after application.  Although it is painless when applied in office, it may cause symptoms of mild pain and burning several hours later.  Treated areas will swell and turn red, and blisters may form.  Vaseline and a bandaid may be applied until wound has healed.  Once healed, the skin may remain temporarily discolored.  It can take weeks to months for pigmentation to return to normal.  Advised to wash off with soap and water in 4 hours or sooner if it becomes tender before then.   Destruction of lesion - Left Proximal 3rd Finger x1  Destruction method: cryotherapy   Informed consent: discussed and consent obtained   Lesion destroyed using liquid nitrogen: Yes   Outcome: patient tolerated procedure well with no complications   Post-procedure details: wound care instructions given   Additional details:  Prior to procedure, discussed risks of blister formation, small wound, skin dyspigmentation, or rare scar following cryotherapy. Recommend Vaseline ointment to treated areas  while healing.   Destruction of lesion - Left Proximal 3rd Finger x1  Destruction method: chemical removal   Informed consent: discussed and consent obtained   Timeout:  patient name, date of birth, surgical site, and procedure verified Chemical destruction method comment:  Cantharone plus Procedure instructions: patient instructed to wash and dry area   Procedure instructions comment:   Wash off in 4-6 hours Outcome: patient tolerated procedure well with no complications   Post-procedure details: wound care instructions given    Neoplasm of uncertain behavior Left ala  Epidermal / dermal shaving  Lesion diameter (cm):  0.2 Informed consent: discussed and consent obtained   Patient was prepped and draped in usual sterile fashion: Area prepped with alcohol. Anesthesia: the lesion was anesthetized in a standard fashion   Anesthetic:  1% lidocaine w/ epinephrine 1-100,000 buffered w/ 8.4% NaHCO3 Instrument used: flexible razor blade   Hemostasis achieved with: pressure, aluminum chloride and electrodesiccation   Outcome: patient tolerated procedure well   Post-procedure details: wound care instructions given   Post-procedure details comment:  Ointment and small bandage applied  Specimen 1 - Surgical pathology Differential Diagnosis: R/O angiofibroma vs other  Check Margins: No  Small sample in bottle  Irritated, bothersome  R/o angiofibroma vs BCC    No follow-ups on file.  I, Emelia Salisbury, scribed for Alfonso Patten, MD.  Documentation: I have reviewed the above documentation for accuracy and completeness, and I agree with the above.  Forest Gleason, MD

## 2021-08-24 NOTE — Patient Instructions (Signed)
Wound Care Instructions  Cleanse wound gently with soap and water once a day then pat dry with clean gauze. Apply a thin coat of Petrolatum (petroleum jelly, "Vaseline") over the wound (unless you have an allergy to this). We recommend that you use a new, sterile tube of Vaseline. Do not pick or remove scabs. Do not remove the yellow or white "healing tissue" from the base of the wound.  Cover the wound with fresh, clean, nonstick gauze and secure with paper tape. You may use Band-Aids in place of gauze and tape if the wound is small enough, but would recommend trimming much of the tape off as there is often too much. Sometimes Band-Aids can irritate the skin.  You should call the office for your biopsy report after 1 week if you have not already been contacted.  If you experience any problems, such as abnormal amounts of bleeding, swelling, significant bruising, significant pain, or evidence of infection, please call the office immediately.  FOR ADULT SURGERY PATIENTS: If you need something for pain relief you may take 1 extra strength Tylenol (acetaminophen) AND 2 Ibuprofen ('200mg'$  each) together every 4 hours as needed for pain. (do not take these if you are allergic to them or if you have a reason you should not take them.) Typically, you may only need pain medication for 1 to 3 days.    Instructions for After In-Office Application of Cantharidin  1. This is a strong medicine; please follow ALL instructions.  2. Gently wash off with soap and water in four hours or sooner s directed by your physician.  3. **WARNING** this medicine can cause severe blistering, blood blisters, infection, and/or scarring if it is not washed off as directed.  4. Your progress will be rechecked in 1-2 months; call sooner if there are any questions or problems.   Cantharidin Plus is a blistering agent that comes from a beetle.  It needs to be washed off in about 4 hours after application.  Although it is painless  when applied in office, it may cause symptoms of mild pain and burning several hours later.  Treated areas will swell and turn red, and blisters may form.  Vaseline and a bandaid may be applied until wound has healed.  Once healed, the skin may remain temporarily discolored.  It can take weeks to months for pigmentation to return to normal.  Advised to wash off with soap and water in 4 hours or sooner if it becomes tender before then.    Due to recent changes in healthcare laws, you may see results of your pathology and/or laboratory studies on MyChart before the doctors have had a chance to review them. We understand that in some cases there may be results that are confusing or concerning to you. Please understand that not all results are received at the same time and often the doctors may need to interpret multiple results in order to provide you with the best plan of care or course of treatment. Therefore, we ask that you please give [place] 2 business days to thoroughly review all your results before contacting the office for clarification. Should we see a critical lab result, you will be contacted sooner.   If You Need Anything After Your Visit  If you have any questions or concerns for your doctor, please call our main line at [phone] and press option 4 to reach your doctor's medical assistant. If no one answers, please leave a voicemail as directed and we will  return your call as soon as possible. Messages left after 4 pm will be answered the following business day.   You may also send [place] a message via [place]. We typically respond to MyChart messages within 1-2 business days.  For prescription refills, please ask your pharmacy to contact our office. Our fax number is [phone].  If you have an urgent issue when the clinic is closed that cannot wait until the next business day, you can page your doctor at the number below.    Please note that while we do our best to be available for urgent  issues outside of office hours, we are not available 24/7.   If you have an urgent issue and are unable to reach [place], you may choose to seek medical care at your doctor's office, retail clinic, urgent care center, or emergency room.  If you have a medical emergency, please immediately call 911 or go to the emergency department.  Pager Numbers  - Dr. Nehemiah Massed: [phone]  - Dr. Laurence Ferrari: [phone]  - Dr. Nicole Kindred: [phone]  In the event of inclement weather, please call our main line at [phone] for an update on the status of any delays or closures.  Dermatology Medication Tips: Please keep the boxes that topical medications come in in order to help keep track of the instructions about where and how to use these. Pharmacies typically print the medication instructions only on the boxes and not directly on the medication tubes.   If your medication is too expensive, please contact our office at [phone] option 4 or send [place] a message through [place].   We are unable to tell what your co-pay for medications will be in advance as this is different depending on your insurance coverage. However, we may be able to find a substitute medication at lower cost or fill out paperwork to get insurance to cover a needed medication.   If a prior authorization is required to get your medication covered by your insurance company, please allow [place] 1-2 business days to complete this process.  Drug prices often vary depending on where the prescription is filled and some pharmacies may offer cheaper prices.  The website www.goodrx.com contains coupons for medications through different pharmacies. The prices here do not account for what the cost may be with help from insurance (it may be cheaper with your insurance), but the website can give you the price if you did not use any insurance.  - You can print the associated coupon and take it with your prescription to the pharmacy.  - You may also stop by  our office during regular business hours and pick up a GoodRx coupon card.  - If you need your prescription sent electronically to a different pharmacy, notify our office through [facility] or by phone at [phone] option 4.     Si Usted Necesita Algo Despus de Su Visita  Tambin puede enviarnos un mensaje a travs de Pharmacist, community. Por lo general respondemos a los mensajes de MyChart en el transcurso de 1 a 2 das hbiles.  Para renovar recetas, por favor pida a su farmacia que se ponga en contacto con nuestra oficina. Harland Dingwall de fax es [place] [phone].  Si tiene un asunto urgente cuando la clnica est cerrada y que no puede esperar hasta el siguiente da hbil, puede llamar/localizar a su doctor(a) al nmero que aparece a continuacin.   Por favor, tenga en cuenta que aunque hacemos todo lo posible para estar disponibles para asuntos  urgentes fuera del horario de [place], no estamos disponibles las 24 horas del da, los 7 das de la [place].   Si tiene un problema urgente y no puede comunicarse con nosotros, puede optar por buscar atencin mdica  en el consultorio de su doctor(a), en una clnica privada, en un centro de atencin urgente o en una sala de emergencias.  Si tiene Engineering geologist, por favor llame inmediatamente al 911 o vaya a la sala de emergencias.  Nmeros de bper  - Dr. Nehemiah Massed: [phone]  - Dra. Moye: [phone]  - Dra. Nicole Kindred: [phone]  En caso de inclemencias del [place], por favor llame a Johnsie Kindred principal al [phone] para una actualizacin sobre el [place] de cualquier retraso o cierre.  Consejos para la medicacin en dermatologa: Por favor, guarde las cajas en las que vienen los medicamentos de uso tpico para ayudarle a seguir las instrucciones sobre dnde y cmo usarlos. Las farmacias generalmente imprimen las instrucciones del medicamento slo en las cajas y no directamente en los tubos del [place].   Si su  medicamento es muy caro, por favor, pngase en contacto con Zigmund Daniel llamando al [phone] y presione la opcin 4 o envenos un mensaje a travs de Pharmacist, community.   No podemos decirle cul ser su copago por los medicamentos por adelantado ya que esto es diferente dependiendo de la cobertura de su seguro. Sin embargo, es posible que podamos encontrar un medicamento sustituto a Electrical engineer un formulario para que el seguro cubra el medicamento que se considera necesario.   Si se requiere una autorizacin previa para que su compaa de seguros [place] su medicamento, por favor permtanos de 1 a 2 das hbiles para completar este proceso.  Los precios de los medicamentos varan con frecuencia dependiendo del Environmental consultant de dnde se surte la receta y alguna farmacias pueden ofrecer precios ms baratos.  El sitio web www.goodrx.com tiene cupones para medicamentos de Airline pilot. Los precios aqu no tienen en cuenta lo que podra costar con la ayuda del seguro (puede ser ms barato con su seguro), pero el sitio web puede darle el precio si no utiliz Research scientist (physical sciences).  - Puede imprimir el cupn correspondiente y llevarlo con su receta a la farmacia.  - Tambin puede pasar por nuestra oficina durante el horario de atencin regular y Charity fundraiser una tarjeta de cupones de GoodRx.  - Si necesita que su receta se enve electrnicamente a una farmacia diferente, informe a nuestra oficina a travs de MyChart de [place] o por telfono llamando al [phone] y presione la opcin 4.

## 2021-08-28 ENCOUNTER — Telehealth: Payer: Self-pay

## 2021-08-28 ENCOUNTER — Encounter: Payer: Self-pay | Admitting: Dermatology

## 2021-08-28 NOTE — Telephone Encounter (Signed)
Called patient. Discussed pathology results. Patient voiced understanding. JP

## 2021-08-28 NOTE — Telephone Encounter (Signed)
-----   Message from Alfonso Patten, MD sent at 08/28/2021  4:48 PM EDT ----- Skin , left ala COMPATIBLE WITH FIBROUS PAPULE --> benign growth, no treatment needed  MAs please call. Thank you!

## 2022-01-09 ENCOUNTER — Telehealth: Payer: Self-pay | Admitting: Obstetrics and Gynecology

## 2022-01-09 ENCOUNTER — Encounter: Payer: Medicaid Other | Admitting: Student

## 2022-01-09 NOTE — Telephone Encounter (Signed)
Spoke with Tara Bullock this morning, she said she was not signed off on IUD's, I rescheduled the patient appointment on a different day with a different provider. I called patient with no answer, I left a detailed message about appointment change.

## 2022-01-11 ENCOUNTER — Encounter: Payer: Self-pay | Admitting: Family Medicine

## 2022-01-11 ENCOUNTER — Encounter: Payer: Self-pay | Admitting: Obstetrics and Gynecology

## 2022-01-11 ENCOUNTER — Ambulatory Visit: Payer: Medicaid Other | Admitting: Obstetrics and Gynecology

## 2022-01-11 ENCOUNTER — Other Ambulatory Visit (HOSPITAL_COMMUNITY)
Admission: RE | Admit: 2022-01-11 | Discharge: 2022-01-11 | Disposition: A | Discharge status: Home / Self Care | Payer: Medicaid Other | Source: Ambulatory Visit | Attending: Student | Admitting: Student

## 2022-01-11 VITALS — BP 119/77 | HR 101 | Ht 61.0 in | Wt 156.0 lb

## 2022-01-11 DIAGNOSIS — Z124 Encounter for screening for malignant neoplasm of cervix: Secondary | ICD-10-CM | POA: Insufficient documentation

## 2022-01-11 DIAGNOSIS — Z01419 Encounter for gynecological examination (general) (routine) without abnormal findings: Secondary | ICD-10-CM | POA: Diagnosis not present

## 2022-01-12 ENCOUNTER — Encounter: Payer: Self-pay | Admitting: *Deleted

## 2022-01-15 LAB — CYTOLOGY - PAP
Adequacy: ABSENT
Comment: NEGATIVE
Diagnosis: NEGATIVE
High risk HPV: NEGATIVE

## 2022-01-16 NOTE — Progress Notes (Signed)
ANNUAL EXAM Patient name: Tara Bullock MRN [id number]  Date of birth: [birth year] Chief Complaint:   Gynecologic Exam  History of Present Illness:   Tara Bullock is a 37 y.o. [phone] being seen today for a routine annual exam.  Current complaints: annual  No LMP recorded (lmp unknown). (Menstrual status: IUD).  Hx of SA and prefers female providers. IUD placed 2018- no issues.    The pregnancy intention screening data noted above was reviewed. Potential methods of contraception were discussed. The patient elected to proceed with No data recorded.   Last pap 10/2018. Results were: NILM w/ HRHPV negative.  Last mammogram: 11/2018. Results were: normal.      01/11/2022   10:46 AM  Depression screen PHQ 2/9  Decreased Interest 3  Down, Depressed, Hopeless 0  PHQ - 2 Score 3  Altered sleeping 3  Tired, decreased energy 1  Change in appetite 0  Feeling bad or failure about yourself  0  Trouble concentrating 0  Moving slowly or fidgety/restless 0  Suicidal thoughts 0  PHQ-9 Score 7        01/11/2022   10:47 AM  GAD 7 : Generalized Anxiety Score  Nervous, Anxious, on Edge 2  Control/stop worrying 1  Worry too much - different things 1  Trouble relaxing 2  Restless 0  Easily annoyed or irritable 0  Afraid - awful might happen 0  Total GAD 7 Score 6     Review of Systems:   Pertinent items are noted in HPI Denies any headaches, blurred vision, fatigue, shortness of breath, chest pain, abdominal pain, abnormal vaginal discharge/itching/odor/irritation, problems with periods, bowel movements, urination, or intercourse unless otherwise stated above. Pertinent History Reviewed:  Reviewed past medical,surgical, social and family history.  Reviewed problem list, medications and allergies. Physical Assessment:   Vitals:   01/11/22 1041  BP: 119/77  Pulse: (!) 101  Weight: 156 lb (70.8 kg)  Height: '5\' 1"'$  (1.549 m)  Body mass index is 29.48  kg/m.        Physical Examination:   General appearance - well appearing, and in no distress  Mental status - alert, oriented to person, place, and time  Psych:  She has a normal mood and affect  Skin - warm and dry, normal color, no suspicious lesions noted  Chest - effort normal, all lung fields clear to auscultation bilaterally  Heart - normal rate and regular rhythm  Abdomen - soft, nontender, nondistended, no masses or organomegaly  Pelvic -  VULVA: normal appearing vulva with no masses, tenderness or lesions   VAGINA: normal appearing vagina with normal color and discharge, no lesions   CERVIX: normal appearing cervix without discharge or lesions, no CMT, IUD strings visualized  Thin prep pap is done wiht HR HPV cotesting  UTERUS: uterus is felt to be normal size, shape, consistency and nontender   ADNEXA: No adnexal masses or tenderness noted.  Extremities:  No swelling or varicosities noted  Chaperone present for exam  No results found for this or any previous visit (from the past 24 hour(s)).    Assessment & Plan:  1. Encounter for Papanicolaou smear for cervical cancer screening Routine pap collected - Cytology - PAP( [place])   Mammogram: @ 37yo, or sooner if problems Colonoscopy: @ 37yo, or sooner if problems  No orders of the defined types were placed in this encounter.   Meds: No orders of the defined types were placed in this encounter.  Follow-up: No follow-ups on file.  Darliss Cheney, MD 01/16/2022 12:04 PM

## 2022-03-15 ENCOUNTER — Ambulatory Visit: Payer: Medicaid Other | Admitting: Dermatology

## 2022-05-07 ENCOUNTER — Telehealth: Payer: Self-pay

## 2022-05-07 NOTE — Telephone Encounter (Addendum)
-----   Message from Lorriane Shire, MD sent at 05/04/2022 11:41 AM EDT ----- Normal pap need repeat in 3 years  Called pt and reviewed results.

## 2022-06-27 ENCOUNTER — Ambulatory Visit: Payer: Medicaid Other | Admitting: Dermatology

## 2022-08-02 ENCOUNTER — Ambulatory Visit: Payer: Medicaid Other | Admitting: Dermatology

## 2022-08-02 VITALS — BP 141/83 | HR 97

## 2022-08-02 DIAGNOSIS — L988 Other specified disorders of the skin and subcutaneous tissue: Secondary | ICD-10-CM | POA: Diagnosis not present

## 2022-08-02 DIAGNOSIS — B360 Pityriasis versicolor: Secondary | ICD-10-CM

## 2022-08-02 DIAGNOSIS — D229 Melanocytic nevi, unspecified: Secondary | ICD-10-CM

## 2022-08-02 DIAGNOSIS — Z7189 Other specified counseling: Secondary | ICD-10-CM

## 2022-08-02 DIAGNOSIS — D225 Melanocytic nevi of trunk: Secondary | ICD-10-CM

## 2022-08-02 DIAGNOSIS — L820 Inflamed seborrheic keratosis: Secondary | ICD-10-CM

## 2022-08-02 DIAGNOSIS — L918 Other hypertrophic disorders of the skin: Secondary | ICD-10-CM

## 2022-08-02 DIAGNOSIS — L821 Other seborrheic keratosis: Secondary | ICD-10-CM

## 2022-08-02 DIAGNOSIS — L814 Other melanin hyperpigmentation: Secondary | ICD-10-CM

## 2022-08-02 MED ORDER — KETOCONAZOLE 2 % EX SHAM: MEDICATED_SHAMPOO | CUTANEOUS | 3 refills | Status: AC

## 2022-08-02 NOTE — Progress Notes (Signed)
Follow-Up Visit   Subjective  Tara Bullock is a 37 y.o. female who presents for the following: several spots to check. She has a growth on her back that is rough and irritated by her bra, skin tags under arms. She also wants to discuss the sun spots on her face.   The patient has spots, moles and lesions to be evaluated, some may be new or changing.   The following portions of the chart were reviewed this encounter and updated as appropriate: medications, allergies, medical history  Review of Systems:  No other skin or systemic complaints except as noted in HPI or Assessment and Plan.  Objective  Well appearing patient in no apparent distress; mood and affect are within normal limits.  A focused examination was performed of the following areas: Face, back, axilla  Relevant physical exam findings are noted in the Assessment and Plan.  R spinal mid back at bra line Erythematous stuck-on, waxy papule or plaque    Assessment & Plan   Inflamed seborrheic keratosis R spinal mid back at bra line  Symptomatic, irritating, patient would like treated.  Destruction of lesion - R spinal mid back at bra line  Destruction method: cryotherapy   Informed consent: discussed and consent obtained   Lesion destroyed using liquid nitrogen: Yes   Region frozen until ice ball extended beyond lesion: Yes   Outcome: patient tolerated procedure well with no complications   Post-procedure details: wound care instructions given   Additional details:  Prior to procedure, discussed risks of blister formation, small wound, skin dyspigmentation, or rare scar following cryotherapy. Recommend Vaseline ointment to treated areas while healing.   LENTIGINES Exam: scattered tan macules on the face Due to sun exposure Treatment Plan: Benign-appearing, observe. Recommend daily broad spectrum sunscreen SPF 30+ to sun-exposed areas, reapply every 2 hours as needed.  Call for any  changes.  Discussed cosmetic procedure (cryotherapy), noncovered.  $60 for 1st lesion and $15 for each additional lesion if done on the same day.  Maximum charge $350.  One touch-up treatment included no charge. Discussed risks of treatment including dyspigmentation, small scar, and/or recurrence. Recommend daily broad spectrum sunscreen SPF 30+/photoprotection to treated areas once healed.  Counseling for BBL / IPL / Laser and Coordination of Care Discussed the treatment option of Broad Band Light (BBL) /Intense Pulsed Light (IPL)/ Laser for skin discoloration, including brown spots and redness.  Typically we recommend at least 1-3 treatment sessions about 5-8 weeks apart for best results.  Cannot have tanned skin when BBL performed, and regular use of sunscreen is advised after the procedure to help maintain results. The patient's condition may also require "maintenance treatments" in the future.  The fee for BBL / laser treatments is $350 per treatment session for the whole face.  A fee can be quoted for other parts of the body.  Insurance typically does not pay for BBL/laser treatments and therefore the fee is an out-of-pocket cost. Discussed prophylactic valtrex treatment. Will send in closer to patient's appt.   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 Condition is bothersome/symptomatic for patient. Currently flared.  Tinea versicolor is a chronic recurrent skin rash causing discolored scaly spots most commonly seen on back, chest, and/or shoulders.  It is generally asymptomatic. The rash is due to overgrowth of a common type of yeast present on everyone's skin and it is not contagious.  It  tends to flare more in the summer due to increased sweating on trunk.  After rash is treated, the scaliness will resolve, but the discoloration will take longer to return to normal pigmentation. The periodic use  of an OTC medicated soap/shampoo with zinc or selenium sulfide can be helpful to prevent yeast overgrowth and recurrence.  Treatment Plan:   Use ketoconazole shampoo as body wash - apply to back 20 mins before shower 3x/week, then rinse. Then use once a week for maintenance, especially in summer months.   SEBORRHEIC KERATOSIS - Stuck-on, waxy, tan-brown papule on the back  - Benign-appearing - Discussed benign etiology and prognosis. - Observe - Call for any changes  ACROCHORDONS (Skin Tags) - Fleshy, skin-colored pedunculated papule L axilla - Benign appearing.  - Symptomatic. Patient desires removal. - Prior to procedure, discussed risks of blister formation, small wound, skin dyspigmentation, or rare scar following cryotherapy.   Destruction Procedure Note Destruction method: cryotherapy   Informed consent: discussed and consent obtained   Lesion destroyed using liquid nitrogen: Yes   Outcome: patient tolerated procedure well with no complications   Post-procedure details: wound care instructions given   Locations: left axilla # of Lesions Treated: 1  Prior to procedure, discussed risks of blister formation, small wound, skin dyspigmentation, or rare scar following cryotherapy. Recommend Vaseline ointment to treated areas while healing.  MELANOCYTIC NEVI Exam: Tan-brown and/or pink-flesh-colored symmetric macules and papules trunk  Treatment Plan: Benign appearing on exam today. Recommend observation. Call clinic for new or changing moles. Recommend daily use of broad spectrum spf 30+ sunscreen to sun-exposed areas.   FACIAL ELASTOSIS Exam: Rhytides and volume loss.  Treatment Plan: Continue The Perfect A Cream nightly to face as tolerated.   Recommend daily broad spectrum sunscreen SPF 30+ to sun-exposed areas, reapply every 2 hours as needed. Call for new or changing lesions.  Staying in the shade or wearing long sleeves, sun glasses (UVA+UVB protection) and wide brim  hats (4-inch brim around the entire circumference of the hat) are also recommended for sun protection.    Return for BBL face fall/winter.  ICherlyn Labella, CMA, am acting as scribe for Willeen Niece, MD .   Documentation: I have reviewed the above documentation for accuracy and completeness, and I agree with the above.  Willeen Niece, MD

## 2022-08-02 NOTE — Patient Instructions (Addendum)
Cryotherapy Aftercare  Wash gently with soap and water everyday.   Apply Vaseline and Band-Aid daily until healed.   Counseling for BBL / IPL / Laser and Coordination of Care Discussed the treatment option of Broad Band Light (BBL) /Intense Pulsed Light (IPL)/ Laser for skin discoloration, including brown spots and redness.  Typically we recommend at least 1-3 treatment sessions about 5-8 weeks apart for best results.  Cannot have tanned skin when BBL performed, and regular use of sunscreen is advised after the procedure to help maintain results. The patient's condition may also require "maintenance treatments" in the future.  The fee for BBL / laser treatments is $350 per treatment session for the whole face.  A fee can be quoted for other parts of the body.  Insurance typically does not pay for BBL/laser treatments and therefore the fee is an out-of-pocket cost.   Tinea versicolor is a chronic recurrent skin rash causing discolored scaly spots most commonly seen on back, chest, and/or shoulders.  It is generally asymptomatic. The rash is due to overgrowth of a common type of yeast present on everyone's skin and it is not contagious.  It tends to flare more in the summer due to increased sweating on trunk.  After rash is treated, the scaliness will resolve, but the discoloration will take longer to return to normal pigmentation. The periodic use of an OTC medicated soap/shampoo with zinc or selenium sulfide can be helpful to prevent yeast overgrowth and recurrence.   Due to recent changes in healthcare laws, you may see results of your pathology and/or laboratory studies on MyChart before the doctors have had a chance to review them. We understand that in some cases there may be results that are confusing or concerning to you. Please understand that not all results are received at the same time and often the doctors may need to interpret multiple results in order to provide you with the best plan of  care or course of treatment. Therefore, we ask that you please give [place] 2 business days to thoroughly review all your results before contacting the office for clarification. Should we see a critical lab result, you will be contacted sooner.   If You Need Anything After Your Visit  If you have any questions or concerns for your doctor, please call our main line at [phone] and press option 4 to reach your doctor's medical assistant. If no one answers, please leave a voicemail as directed and we will return your call as soon as possible. Messages left after 4 pm will be answered the following business day.   You may also send [place] a message via MyChart. We typically respond to MyChart messages within 1-2 business days.  For prescription refills, please ask your pharmacy to contact our office. Our fax number is [phone].  If you have an urgent issue when the clinic is closed that cannot wait until the next business day, you can page your doctor at the number below.    Please note that while we do our best to be available for urgent issues outside of office hours, we are not available 24/7.   If you have an urgent issue and are unable to reach [place], you may choose to seek medical care at your doctor's office, retail clinic, urgent care center, or emergency room.  If you have a medical emergency, please immediately call 911 or go to the emergency department.  Pager Numbers  - Dr. Gwen Pounds: [phone]  - Dr. Roseanne Reno: [phone]  In the event of inclement weather, please call our main line at [phone] for an update on the status of any delays or closures.  Dermatology Medication Tips: Please keep the boxes that topical medications come in in order to help keep track of the instructions about where and how to use these. Pharmacies typically print the medication instructions only on the boxes and not directly on the medication tubes.   If your medication is too expensive, please  contact our office at [phone] option 4 or send [place] a message through MyChart.   We are unable to tell what your co-pay for medications will be in advance as this is different depending on your insurance coverage. However, we may be able to find a substitute medication at lower cost or fill out paperwork to get insurance to cover a needed medication.   If a prior authorization is required to get your medication covered by your insurance company, please allow [place] 1-2 business days to complete this process.  Drug prices often vary depending on where the prescription is filled and some pharmacies may offer cheaper prices.  The website www.goodrx.com contains coupons for medications through different pharmacies. The prices here do not account for what the cost may be with help from insurance (it may be cheaper with your insurance), but the website can give you the price if you did not use any insurance.  - You can print the associated coupon and take it with your prescription to the pharmacy.  - You may also stop by our office during regular business hours and pick up a GoodRx coupon card.  - If you need your prescription sent electronically to a different pharmacy, notify our office through [facility] or by phone at [phone] option 4.     Si Usted Necesita Algo Despus de Su Visita  Tambin puede enviarnos un mensaje a travs de Clinical cytogeneticist. Por lo general respondemos a los mensajes de MyChart en el transcurso de 1 a 2 das hbiles.  Para renovar recetas, por favor pida a su farmacia que se ponga en contacto con nuestra oficina. Annie Sable de fax es [place] [phone].  Si tiene un asunto urgente cuando la clnica est cerrada y que no puede esperar hasta el siguiente da hbil, puede llamar/localizar a su doctor(a) al nmero que aparece a continuacin.   Por favor, tenga en cuenta que aunque hacemos todo lo posible para estar disponibles para asuntos urgentes fuera del horario de  [place], no estamos disponibles las 24 horas del da, los 7 [address]  [address] de la [place].   Si tiene un problema urgente y no puede comunicarse con nosotros, puede optar por buscar atencin mdica  en el consultorio de su doctor(a), en una clnica privada, en un centro de atencin urgente o en una sala de emergencias.  Si tiene Engineer, drilling, por favor llame inmediatamente al 911 o vaya a la sala de emergencias.  Nmeros de bper  - Dr. Gwen Pounds: [phone]  - Dra. Roseanne Reno: [phone]  En caso de inclemencias del [place], por favor llame a Lacy Duverney principal al [phone] para una actualizacin sobre el [place] de cualquier retraso o cierre.  Consejos para la medicacin en dermatologa: Por favor, guarde las cajas en las que vienen los medicamentos de uso tpico para ayudarle a seguir las instrucciones sobre dnde y cmo usarlos. Las farmacias generalmente imprimen las instrucciones del medicamento slo en las cajas y no directamente en los tubos del [place].   Si su medicamento es [employer]  caro, por favor, pngase en contacto con nuestra oficina llamando al [phone] y presione la opcin 4 o envenos un mensaje a travs de Clinical cytogeneticist.   No podemos decirle cul ser su copago por los medicamentos por adelantado ya que esto es diferente dependiendo de la cobertura de su seguro. Sin embargo, es posible que podamos encontrar un medicamento sustituto a Audiological scientist un formulario para que el seguro cubra el medicamento que se considera necesario.   Si se requiere una autorizacin previa para que su compaa de seguros [place] su medicamento, por favor permtanos de 1 a 2 das hbiles para completar [address]  Los precios de los medicamentos varan con frecuencia dependiendo del Environmental consultant de dnde se surte la receta y alguna farmacias pueden ofrecer precios ms baratos.  El sitio web www.goodrx.com tiene cupones para medicamentos de Health and safety inspector. Los precios aqu no tienen en  cuenta lo que podra costar con la ayuda del seguro (puede ser ms barato con su seguro), pero el sitio web puede darle el precio si no utiliz Tourist information centre manager.  - Puede imprimir el cupn correspondiente y llevarlo con su receta a la farmacia.  - Tambin puede pasar por nuestra oficina durante el horario de atencin regular y Education officer, museum una tarjeta de cupones de GoodRx.  - Si necesita que su receta se enve electrnicamente a una farmacia diferente, informe a nuestra oficina a travs de MyChart de [place] o por telfono llamando al [phone] y presione la opcin 4.

## 2022-08-08 ENCOUNTER — Ambulatory Visit: Payer: Medicaid Other | Admitting: Dermatology

## 2023-01-10 ENCOUNTER — Encounter: Payer: Self-pay | Admitting: Dermatology

## 2023-01-10 ENCOUNTER — Ambulatory Visit (INDEPENDENT_AMBULATORY_CARE_PROVIDER_SITE_OTHER): Payer: Medicaid Other | Admitting: Dermatology

## 2023-01-10 DIAGNOSIS — F4322 Adjustment disorder with anxiety: Secondary | ICD-10-CM | POA: Insufficient documentation

## 2023-01-10 DIAGNOSIS — F17200 Nicotine dependence, unspecified, uncomplicated: Secondary | ICD-10-CM | POA: Insufficient documentation

## 2023-01-10 DIAGNOSIS — L905 Scar conditions and fibrosis of skin: Secondary | ICD-10-CM

## 2023-01-10 DIAGNOSIS — F419 Anxiety disorder, unspecified: Secondary | ICD-10-CM | POA: Insufficient documentation

## 2023-01-10 DIAGNOSIS — M549 Dorsalgia, unspecified: Secondary | ICD-10-CM | POA: Insufficient documentation

## 2023-01-10 DIAGNOSIS — F339 Major depressive disorder, recurrent, unspecified: Secondary | ICD-10-CM | POA: Insufficient documentation

## 2023-01-10 DIAGNOSIS — D069 Carcinoma in situ of cervix, unspecified: Secondary | ICD-10-CM | POA: Insufficient documentation

## 2023-01-10 NOTE — Patient Instructions (Signed)

## 2023-01-10 NOTE — Progress Notes (Deleted)
   Follow-Up Visit   Subjective  Tara Bullock is a 38 y.o. female who presents for the following: Patient here today concerning several spots at face she is concerned about and a spot at right thigh she is concerned is changing.   She reports spots at right cheek, scar at forehead and irritated spot on forehead as well.   The patient has spots, moles and lesions to be evaluated, some may be new or changing and the patient may have concern these could be cancer.   The following portions of the chart were reviewed this encounter and updated as appropriate: medications, allergies, medical history  Review of Systems:  No other skin or systemic complaints except as noted in HPI or Assessment and Plan.  Objective  Well appearing patient in no apparent distress; mood and affect are within normal limits.   A focused examination was performed of the following areas: Face, right thigh  Relevant exam findings are noted in the Assessment and Plan.    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 [address] [place], [place] [address] [phone]  [email or web address]  Exam: Dyspigmented smooth macule or patch. Benign-appearing.  Observation.  Call clinic for new or changing lesions. Recommend daily broad spectrum sunscreen SPF 30+, reapply every 2 hours as needed. Treatment: Recommend Serica moisturizing scar formula cream every night or [facility] brand or Mederma silicone scar sheet every night for the first year after a scar appears to help with scar remodeling if desired. Scars remodel on their own for a full year and will gradually improve in appearance over time.     ACTINIC DAMAGE - chronic, secondary to cumulative UV radiation exposure/sun exposure over time - diffuse scaly erythematous macules with underlying dyspigmentation - Recommend daily broad spectrum sunscreen SPF  30+ to sun-exposed areas, reapply every 2 hours as needed.  - Recommend staying in the shade or wearing long sleeves, sun glasses (UVA+UVB protection) and wide brim hats (4-inch brim around the entire circumference of the hat). - Call for new or changing lesions.    No follow-ups on file.  I, Eleanor Blush, CMA, am acting as scribe for Boneta Sharps, MD.   Documentation: I have reviewed the above documentation for accuracy and completeness, and I agree with the above.  Boneta Sharps, MD

## 2023-01-11 ENCOUNTER — Encounter: Payer: Self-pay | Admitting: Dermatology

## 2023-01-11 NOTE — Progress Notes (Signed)
 Visit was started with patient and Melissa Wilson CMA. Patient requested evaluation of a lesion of concern on the face. Lesion was clinically suggestive of a scar. Patient expressed a desire to have the scar on the left forehead removed. I explained that an excision would replace the current scar with a longer and thinner scar. Explained that I do not do facial surgeries so I would refer to a plastic surgeon. Patient expressed frustration about me not doing a facial surgery and stated that Dr Hester has done such surgeries in the past. Patient asked why it was not communicated that certain doctors have limitations. It would not have been possible to forewarn about my preference against facial surgery since we did not know the nature of the lesion of concern before the visit. Stated that patient may reschedule with Dr Hester. Visit ended and I allowed the copay to be refunded.

## 2023-04-11 ENCOUNTER — Telehealth: Payer: Self-pay

## 2023-04-11 NOTE — Telephone Encounter (Signed)
 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 -on chart review, fistula created 2020 and last seen in our office 2023 in which fistula was  clotted off several months prior.  No further visits.  -LM on VM to call Vascular and Vein @ [phone]

## 2023-04-12 ENCOUNTER — Ambulatory Visit (INDEPENDENT_AMBULATORY_CARE_PROVIDER_SITE_OTHER): Admitting: Physician Assistant

## 2023-04-12 ENCOUNTER — Encounter: Payer: Self-pay | Admitting: Vascular Surgery

## 2023-04-12 VITALS — BP 135/67 | HR 92 | Temp 98.5°F | Ht 61.0 in | Wt 119.0 lb

## 2023-04-12 DIAGNOSIS — I808 Phlebitis and thrombophlebitis of other sites: Secondary | ICD-10-CM | POA: Diagnosis not present

## 2023-04-12 MED ORDER — HYDROCODONE-ACETAMINOPHEN 5-325 MG PO TABS: 1.0000 | ORAL_TABLET | Freq: Three times a day (TID) | ORAL | 0 refills | Status: AC | PRN

## 2023-04-12 NOTE — Progress Notes (Signed)
 Office Note   History of Present Illness   Tara Bullock is a 38 y.o. (1985-05-19) female who presents as a triage visit.  She has a history of left brachiocephalic AV fistula creation on 01/17/2018 by Dr. Shirley Douglas.  This fistula was never used for dialysis since she received a kidney transplant in 2021.  In February 2023 she experienced sudden erythema and pain surrounding her fistula, and duplex demonstrated that the fistula was mostly occluded except at the anastomosis.  She was seen by Dr. Rosalva Comber in April 2023 with no concern for infection of her thrombosed fistula.  Declot was not recommended.  She returns today as a triage visit.  She states on Wednesday morning she woke up and her left upper arm felt sore.  Later on that evening, she noticed increased erythema, swelling, and tenderness to her left arm near her old fistula.  Since then the redness and swelling has not improved.  The swollen areas of her arm are exquisitely tender and painful even at rest.  She has tried to take her Tylenol and tramadol but this has not helped with her pain.  She denies any fevers or chills.  She denies any generalized swelling of the left lower arm or hand.  She denies any pain or weakness in the left hand.  She does have some tingling in some of her fingers on the left, however this is at baseline due to her underlying trigger finger and arthritis.  Current Outpatient Medications  Medication Sig Dispense Refill   gabapentin (NEURONTIN) 300 MG capsule Take 600 mg by mouth 2 (two) times daily.   1   hyoscyamine (LEVSIN SL) 0.125 MG SL tablet Place 0.125 mg under the tongue every 4 (four) hours as needed for cramping.      levonorgestrel (MIRENA) 20 MCG/24HR IUD 1 each by Intrauterine route once.      mycophenolate (MYFORTIC) 180 MG EC tablet Take by mouth.     pantoprazole (PROTONIX) 40 MG tablet Take 40 mg by mouth daily.     predniSONE (DELTASONE) 5 MG tablet Take by mouth.     promethazine  (PHENERGAN) 25 MG tablet Take 25 mg by mouth every 6 (six) hours as needed for nausea or vomiting.      tacrolimus (PROGRAF) 1 MG capsule 6 capsule by mouth two times daily     traMADol (ULTRAM) 50 MG tablet Take 100 mg by mouth 3 (three) times daily.  0   albuterol (VENTOLIN HFA) 108 (90 Base) MCG/ACT inhaler INHALE TWO PUFFS INTO THE LUNGS EVERY 4 HOURS AS NEEDED FOR WHEEZING OR SHORTNESS OF BREATH.     ALPRAZolam (XANAX) 0.5 MG tablet Take 0.5 mg by mouth 3 (three) times daily as needed for sleep or anxiety.      amLODipine (NORVASC) 10 MG tablet Take 10 mg by mouth daily.     ARIPiprazole (ABILIFY) 5 MG tablet Take 5 mg by mouth daily.  (Patient not taking: Reported on 04/12/2023)     Biotin 1 MG CAPS Take 1 tablet by mouth daily.     buPROPion (WELLBUTRIN XL) 150 MG 24 hr tablet Take 150 mg by mouth every morning. (Patient not taking: Reported on 04/12/2023)     cyclobenzaprine (FLEXERIL) 10 MG tablet Take 10 mg by mouth 3 (three) times daily as needed for muscle spasms.      DULoxetine (CYMBALTA) 60 MG capsule Take 120 mg by mouth daily. (Patient not taking: Reported on 04/12/2023)  ketoconazole (NIZORAL) 2 % shampoo Apply to back 20 minutes before shower, then rinse. Use once a day until improved, then once a week for maintenance during the summer months. 120 mL 3   lamoTRIgine (LAMICTAL) 200 MG tablet Take 400 mg by mouth at bedtime.  (Patient not taking: Reported on 04/12/2023)     Probiotic Product (PROBIOTIC DAILY PO) Take 1 capsule by mouth daily. (Patient not taking: Reported on 04/12/2023)     No current facility-administered medications for this visit.    REVIEW OF SYSTEMS (negative unless checked):   Cardiac:  []  Chest pain or chest pressure? []  Shortness of breath upon activity? []  Shortness of breath when lying flat? []  Irregular heart rhythm?  Vascular:  []  Pain in calf, thigh, or hip brought on by walking? []  Pain in feet at night that wakes you up from your sleep? []   Blood clot in your veins? []  Leg swelling?  Pulmonary:  []  Oxygen at home? []  Productive cough? []  Wheezing?  Neurologic:  []  Sudden weakness in arms or legs? []  Sudden numbness in arms or legs? []  Sudden onset of difficult speaking or slurred speech? []  Temporary loss of vision in one eye? []  Problems with dizziness?  Gastrointestinal:  []  Blood in stool? []  Vomited blood?  Genitourinary:  []  Burning when urinating? []  Blood in urine?  Psychiatric:  []  Major depression  Hematologic:  []  Bleeding problems? []  Problems with blood clotting?  Dermatologic:  []  Rashes or ulcers?  Constitutional:  []  Fever or chills?  Ear/Nose/Throat:  []  Change in hearing? []  Nose bleeds? []  Sore throat?  Musculoskeletal:  []  Back pain? []  Joint pain? []  Muscle pain?   Physical Examination   Vitals:   04/12/23 0932  BP: 135/67  Pulse: 92  Temp: 98.5 F (36.9 C)  TempSrc: Temporal  SpO2: 95%  Weight: 119 lb (54 kg)  Height: 5\' 1"  (1.549 m)   Body mass index is 22.48 kg/m.  General:  WDWN in NAD; vital signs documented above Gait: Not observed HENT: WNL, normocephalic Pulmonary: normal non-labored breathing , without rales, rhonchi,  wheezing Cardiac: regular Abdomen: soft, NT, no masses Skin: without rashes Vascular Exam/Pulses: 2+ left radial pulse Extremities: Complete occlusion of left brachiocephalic fistula.  Palpable cord just proximal and distal to the left AC fossa with localized erythema, swelling, and exquisite tenderness. No left hand swelling. Musculoskeletal: no muscle wasting or atrophy  Neurologic: A&O X 3;  No focal weakness or paresthesias are detected Psychiatric:  The pt has Normal affect.  Medical Decision Making   Tara Bullock is a 38 y.o. female who presents today as a triage visit  The patient has a history of left brachiocephalic AV fistula creation several years ago for dialysis access.  Her fistula was never used for  dialysis and she eventually received a kidney transplant, which is still functioning She was seen by Dr. Karin Lieu in 2023 after experiencing a brief episode of erythema, tenderness, and swelling to her fistula.  Duplex at that time demonstrated that her fistula was mostly occluded, except in the distal upper arm near the anastomosis.  Declot at that time was not recommended given that her fistula was never used and that she is not on dialysis At her triage visit today she states Wednesday morning she woke up with random soreness of the left upper arm.  Later that day her soreness worsened and she developed 2 small areas of localized swelling, redness, and tenderness around her old fistula.  Her symptoms have persisted for the past 2 days and her pain has not improved with tramadol.  She denies any localized swelling to the left arm.  She denies any pain or weakness of the left hand On exam her left hand is well-perfused with a 2+ radial pulse.  She has intact motor and sensation of the left hand.  She has no generalized swelling of the left upper extremity.  She appears to have a short segment of thrombophlebitis of her cephalic vein on the left, with localized erythema and swelling just proximal and distal to the [facility] fossa.  Dr. Susi Eric was also present during this evaluation.  We have explained to the patient that her symptoms are likely due to complete occlusion of her old left brachiocephalic fistula.  At this time we do not think she requires any anticoagulation, given that there is low concern for DVT and her thrombophlebitis is only in a short segment of the cephalic vein. For treatment we have recommended conservative therapy including warm compress, compression with an Ace bandage, arm elevation, and rest. I have also given her a short prescription of hydrocodone to help with her pain. She can follow up in 1 week to make sure her symptoms have improved/resolved    Deneise Finlay PA-C [facility] [place] Office: [phone]  Clinic MD: Susi Eric

## 2023-04-19 ENCOUNTER — Ambulatory Visit: Admitting: Physician Assistant

## 2023-04-19 VITALS — BP 130/73 | HR 96 | Temp 98.3°F | Ht 61.0 in | Wt 121.0 lb

## 2023-04-19 DIAGNOSIS — I808 Phlebitis and thrombophlebitis of other sites: Secondary | ICD-10-CM

## 2023-04-19 NOTE — Progress Notes (Signed)
 Office Note   History of Present Illness   Tara Bullock is a 38 y.o. ([birth year]) female who presents for follow up.  She has a history of left brachiocephalic AV fistula creation on 01/17/2018 by Dr. Oris.  The majority of her fistula was known to be occluded by April 2023.  Her fistula was never used for dialysis, and she has had a functioning kidney transplant since 2021.  She was seen in our office a week ago with thrombophlebitis of her left cephalic vein, just proximal and distal to the [facility] fossa.   She returns today for repeat arm check.  She states that her pain around the thrombosed vein has significantly improved.  She no longer needs any pain medications.  She is able to use her arm now with minimal pain.  She does have a small amount of remaining tenderness at the vein.  Her erythema and swelling has also resolved.  Current Outpatient Medications  Medication Sig Dispense Refill   albuterol  (VENTOLIN  HFA) 108 (90 Base) MCG/ACT inhaler INHALE TWO PUFFS INTO THE LUNGS EVERY 4 HOURS AS NEEDED FOR WHEEZING OR SHORTNESS OF BREATH.     ALPRAZolam  (XANAX ) 0.5 MG tablet Take 0.5 mg by mouth 3 (three) times daily as needed for sleep or anxiety.      amLODipine (NORVASC) 10 MG tablet Take 10 mg by mouth daily.     ARIPiprazole (ABILIFY) 5 MG tablet Take 5 mg by mouth daily.     Biotin 1 MG CAPS Take 1 tablet by mouth daily.     buPROPion (WELLBUTRIN XL) 150 MG 24 hr tablet Take 150 mg by mouth every morning.     cyclobenzaprine (FLEXERIL) 10 MG tablet Take 10 mg by mouth 3 (three) times daily as needed for muscle spasms.      DULoxetine  (CYMBALTA ) 60 MG capsule Take 120 mg by mouth daily.     gabapentin  (NEURONTIN ) 300 MG capsule Take 600 mg by mouth 2 (two) times daily.   1   hyoscyamine  (LEVSIN  SL) 0.125 MG SL tablet Place 0.125 mg under the tongue every 4 (four) hours as needed for cramping.      ketoconazole  (NIZORAL ) 2 % shampoo Apply to back 20 minutes before shower,  then rinse. Use once a day until improved, then once a week for maintenance during the summer months. 120 mL 3   lamoTRIgine (LAMICTAL) 200 MG tablet Take 400 mg by mouth at bedtime.     levonorgestrel  (MIRENA ) 20 MCG/24HR IUD 1 each by Intrauterine route once.      mycophenolate (MYFORTIC) 180 MG EC tablet Take by mouth.     pantoprazole (PROTONIX) 40 MG tablet Take 40 mg by mouth daily.     predniSONE (DELTASONE) 5 MG tablet Take by mouth.     Probiotic Product (PROBIOTIC DAILY PO) Take 1 capsule by mouth daily.     promethazine  (PHENERGAN ) 25 MG tablet Take 25 mg by mouth every 6 (six) hours as needed for nausea or vomiting.      tacrolimus (PROGRAF) 1 MG capsule 6 capsule by mouth two times daily     traMADol  (ULTRAM ) 50 MG tablet Take 100 mg by mouth 3 (three) times daily.  0   HYDROcodone -acetaminophen  (NORCO/VICODIN) 5-325 MG tablet Take 1 tablet by mouth every 8 (eight) hours as needed for up to 7 days for moderate pain (pain score 4-6). (Patient not taking: Reported on 04/19/2023) 21 tablet 0   No current facility-administered medications for this  visit.    REVIEW OF SYSTEMS (negative unless checked):   Cardiac:  []  Chest pain or chest pressure? []  Shortness of breath upon activity? []  Shortness of breath when lying flat? []  Irregular heart rhythm?  Vascular:  []  Pain in calf, thigh, or hip brought on by walking? []  Pain in feet at night that wakes you up from your sleep? []  Blood clot in your veins? []  Leg swelling?  Pulmonary:  []  Oxygen at home? []  Productive cough? []  Wheezing?  Neurologic:  []  Sudden weakness in arms or legs? []  Sudden numbness in arms or legs? []  Sudden onset of difficult speaking or slurred speech? []  Temporary loss of vision in one eye? []  Problems with dizziness?  Gastrointestinal:  []  Blood in stool? []  Vomited blood?  Genitourinary:  []  Burning when urinating? []  Blood in urine?  Psychiatric:  []  Major depression  Hematologic:   []  Bleeding problems? []  Problems with blood clotting?  Dermatologic:  []  Rashes or ulcers?  Constitutional:  []  Fever or chills?  Ear/Nose/Throat:  []  Change in hearing? []  Nose bleeds? []  Sore throat?  Musculoskeletal:  []  Back pain? []  Joint pain? []  Muscle pain?   Physical Examination   Vitals:   04/19/23 1001  BP: 130/73  Pulse: 96  Temp: 98.3 F (36.8 C)  TempSrc: Temporal  SpO2: 98%  Weight: 121 lb (54.9 kg)  Height: 5' 1 (1.549 m)   Body mass index is 22.86 kg/m.  General:  WDWN in NAD; vital signs documented above Gait: Not observed HENT: WNL, normocephalic Pulmonary: normal non-labored breathing , without rales, rhonchi,  wheezing Cardiac: regular Abdomen: soft, NT, no masses Skin: without rashes Vascular Exam/Pulses: Palpable left radial pulse Extremities: Palpable cord of the left cephalic vein above and below the AC fossa without erythema or swelling Musculoskeletal: no muscle wasting or atrophy  Neurologic: A&O X 3;  No focal weakness or paresthesias are detected Psychiatric:  The pt has Normal affect   Medical Decision Making   Tara Bullock is a 38 y.o. female who presents for follow-up  The patient has a history of left brachiocephalic AV fistula creation several years ago.  She was seen at our office 1 week ago with thrombophlebitis of the left cephalic vein just distal and proximal to the [facility] fossa Her symptoms have greatly improved at today's visit.  She no longer has any erythema or swelling of the cephalic vein.  There is still a palpable cord present with minimal tenderness. Her left arm is warm and well-perfused with a palpable radial pulse.  She has no left arm or hand swelling. I have encouraged the patient that it may take several weeks to months for this cord to soften.  She can continue conservative therapy as needed such as arm elevation, compression, and warm compress. She can follow-up with our office as  needed   Ahmed Holster PA-C [facility] [place] Office: [phone]  Clinic MD: [place]

## 2023-06-12 ENCOUNTER — Encounter: Payer: Self-pay | Admitting: Dermatology

## 2023-06-12 ENCOUNTER — Ambulatory Visit: Payer: Medicaid Other | Admitting: Dermatology

## 2023-06-12 ENCOUNTER — Other Ambulatory Visit: Payer: Self-pay

## 2023-06-12 ENCOUNTER — Telehealth: Payer: Self-pay

## 2023-06-12 DIAGNOSIS — D225 Melanocytic nevi of trunk: Secondary | ICD-10-CM | POA: Diagnosis not present

## 2023-06-12 DIAGNOSIS — L578 Other skin changes due to chronic exposure to nonionizing radiation: Secondary | ICD-10-CM | POA: Diagnosis not present

## 2023-06-12 DIAGNOSIS — Z7189 Other specified counseling: Secondary | ICD-10-CM

## 2023-06-12 DIAGNOSIS — Z1283 Encounter for screening for malignant neoplasm of skin: Secondary | ICD-10-CM | POA: Diagnosis not present

## 2023-06-12 DIAGNOSIS — L820 Inflamed seborrheic keratosis: Secondary | ICD-10-CM

## 2023-06-12 DIAGNOSIS — D229 Melanocytic nevi, unspecified: Secondary | ICD-10-CM

## 2023-06-12 DIAGNOSIS — L700 Acne vulgaris: Secondary | ICD-10-CM

## 2023-06-12 DIAGNOSIS — D239 Other benign neoplasm of skin, unspecified: Secondary | ICD-10-CM

## 2023-06-12 DIAGNOSIS — L814 Other melanin hyperpigmentation: Secondary | ICD-10-CM | POA: Diagnosis not present

## 2023-06-12 DIAGNOSIS — Z79899 Other long term (current) drug therapy: Secondary | ICD-10-CM

## 2023-06-12 DIAGNOSIS — Z940 Kidney transplant status: Secondary | ICD-10-CM

## 2023-06-12 DIAGNOSIS — D492 Neoplasm of unspecified behavior of bone, soft tissue, and skin: Secondary | ICD-10-CM | POA: Diagnosis not present

## 2023-06-12 DIAGNOSIS — Z9483 Pancreas transplant status: Secondary | ICD-10-CM

## 2023-06-12 DIAGNOSIS — L821 Other seborrheic keratosis: Secondary | ICD-10-CM

## 2023-06-12 DIAGNOSIS — D485 Neoplasm of uncertain behavior of skin: Secondary | ICD-10-CM

## 2023-06-12 HISTORY — DX: Other benign neoplasm of skin, unspecified: D23.9

## 2023-06-12 MED ORDER — WINLEVI 1 % EX CREA: TOPICAL_CREAM | CUTANEOUS | 6 refills | Status: AC

## 2023-06-12 MED ORDER — AMZEEQ 4 % EX FOAM: CUTANEOUS | 6 refills | Status: AC

## 2023-06-12 NOTE — Progress Notes (Signed)
 Follow-Up Visit   Subjective  Tara Bullock is a 38 y.o. female who presents for the following: Lesion on the R cheek that changes in size, itchy and sore at times. Scar on the forehead x 20 years, previously was a cyst, can express white pus, then fills back up. L hip, L axilla, R back lesion changing, pt concerned and would like removed. Pt would like to discuss retinoids, currently she [employer] [facility] A, but continues to have acne breakouts.   The following portions of the chart were reviewed this encounter and updated as appropriate: medications, allergies, medical history  Review of Systems:  No other skin or systemic complaints except as noted in HPI or Assessment and Plan.  Objective  Well appearing patient in no apparent distress; mood and affect are within normal limits.  A focused examination was performed of the following areas: the face, trunk, extremities  Relevant exam findings are noted in the Assessment and Plan.  Right Upper Back 0.8 cm irregular brown macule.  Right Lower Back 0.6 cm irregular brown macule.  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  Chronic and persistent condition with duration or expected duration over one year. Condition is symptomatic/ bothersome to patient. Not currently at goal. Treatment Plan: Continue topical tretinoin. Start topical Winlevi bid to face for acne. Start topical Amzeeq (4% Monocycline foam) (Samples of Winlevi and Amzeeq given)  If above topical treatment not adequate will consider oral Doxycycline  50-100 mg po QD,  Consider Spironolactone 25 mg po every day if oral Doxycycline  not adequate to control acne, but these oral meds would also need to be approved through her nephrologist.  pt had a kidney and pancreas transplant, and we will check with  her nephrologist, if patient ok to use for long term, chronic treatment of acne with oral doxycycline   -- . [facility] [phone] Dr. Truman Gables  Long term medication management.  Patient is using long term (months to years) prescription medication  to control their dermatologic condition.  These medications require periodic monitoring to evaluate for efficacy and side effects and may require periodic laboratory monitoring.   NEOPLASM OF UNCERTAIN BEHAVIOR OF SKIN (2) Right Upper Back Epidermal / dermal shaving  Lesion diameter (cm):  0.8 Informed consent: discussed and consent obtained   Timeout: patient name, date of birth, surgical site, and procedure verified   Procedure prep:  Patient was prepped and draped in usual sterile fashion Prep type:  Isopropyl alcohol Anesthesia: the lesion was anesthetized in a standard fashion   Anesthetic:  1% lidocaine  w/ epinephrine  1-100,000 buffered w/ 8.4% NaHCO3 Instrument used: flexible razor blade   Hemostasis achieved with: pressure, aluminum chloride and electrodesiccation   Outcome: patient tolerated procedure well   Post-procedure details: sterile dressing applied and wound care instructions given   Dressing type: bandage (Mupirocin 2% ointment)   Specimen 1 - Surgical pathology Differential Diagnosis: D48.5 r/o dysplastic nevus Check Margins: Yes Right Lower Back Epidermal / dermal shaving  Lesion diameter (cm):  0.6 Informed consent: discussed and consent obtained   Timeout: patient name, date of birth, surgical site, and procedure verified   Procedure prep:  Patient was prepped and draped in usual sterile fashion Prep type:  Isopropyl alcohol Anesthesia: the lesion was anesthetized in  a standard fashion   Anesthetic:  1% lidocaine  w/ epinephrine  1-100,000 buffered w/ 8.4% NaHCO3 Instrument used: flexible razor blade   Hemostasis achieved with: pressure, aluminum chloride and electrodesiccation    Outcome: patient tolerated procedure well   Post-procedure details: sterile dressing applied and wound care instructions given   Dressing type: bandage (Mupirocin 2% ointment)   Specimen 2 - Surgical pathology Differential Diagnosis: D48.5 r/o dysplastic nevus Check Margins: Yes INFLAMED SEBORRHEIC KERATOSIS (3) L ant lat waistline x 1, L neck x 1, L infra axillary x 1 (3) Symptomatic, irritating, patient would like treated.  Destruction of lesion - L ant lat waistline x 1, L neck x 1, L infra axillary x 1 (3) Complexity: simple   Destruction method: cryotherapy   Informed consent: discussed and consent obtained   Timeout:  patient name, date of birth, surgical site, and procedure verified Lesion destroyed using liquid nitrogen: Yes   Region frozen until ice ball extended beyond lesion: Yes   Outcome: patient tolerated procedure well with no complications   Post-procedure details: wound care instructions given   ACTINIC SKIN DAMAGE   SKIN CANCER SCREENING   LENTIGO   MELANOCYTIC NEVUS, UNSPECIFIED LOCATION   SEBORRHEIC KERATOSIS   COUNSELING AND COORDINATION OF CARE   MEDICATION MANAGEMENT   HISTORY OF SIMULTANEOUS KIDNEY AND PANCREAS TRANSPLANT (HCC)    Skin cancer screening performed today.   LENTIGINES Exam: scattered tan macules Due to sun exposure Treatment Plan: Benign-appearing, observe. Recommend daily broad spectrum sunscreen SPF 30+ to sun-exposed areas, reapply every 2 hours as needed.  Call for any changes  SEBORRHEIC KERATOSIS - Stuck-on, waxy, tan-brown papules and/or plaques  - Benign-appearing - Discussed benign etiology and prognosis. - Observe - Call for any changes  MELANOCYTIC NEVI Exam: Tan-brown and/or pink-flesh-colored symmetric macules and papules Treatment Plan: Benign appearing on exam today. Recommend observation. Call clinic for new or changing moles. Recommend daily use of broad spectrum spf 30+ sunscreen to sun-exposed  areas.     Return if symptoms worsen or fail to improve.  Arlinda Lais, CMA, am acting as scribe for Celine Collard, MD .   Documentation: I have reviewed the above documentation for accuracy and completeness, and I agree with the above.  Celine Collard, MD

## 2023-06-12 NOTE — Telephone Encounter (Signed)
 Contacted patient as requested by Dr. Bary Likes see below [facility] over directions with how to use each topical while patient wrote down instructions over phone. Medications sent to pharmacy. Patient given number to [facility] and advised to contact us  if she is not able to afford medications. Also advised to let us  know if she is not responding to creams after several weeks.   Will consider other topicals if patient is unable to get prescribed topicals.    Please contact patient and advise that we are going to try to treat her with topical medications rather than oral medication for her acne. We will try topical Winlevi cream twice a day to the face and topical Amzeeq foam daily in the morning and will continue the Tretinoin when at bedtime for acne. She may need refills for the tretinoin cream at bedtime please send Winlevi to [facility].   And please send Amzeeq and tretinoin into the pharmacy for her.Please contact and advise patient of all the above DK. We have samples of Amzeeq and Winlevi she can pick up at front. Leave the samples for her at front desk.

## 2023-06-12 NOTE — Patient Instructions (Addendum)

## 2023-06-17 ENCOUNTER — Ambulatory Visit: Payer: Self-pay | Admitting: Dermatology

## 2023-06-17 LAB — SURGICAL PATHOLOGY

## 2023-06-18 ENCOUNTER — Encounter: Payer: Self-pay | Admitting: Dermatology

## 2023-06-18 NOTE — Telephone Encounter (Addendum)
 Tried calling patient regarding results. No answer. Lm for patient to return call. Patient needs follow up appt scheduled to have spot at right upper back rechecked.  ----- Message from Celine Collard sent at 06/17/2023  5:41 PM EDT ----- FINAL DIAGNOSIS        1. Skin, right upper back :       DYSPLASTIC COMPOUND NEVUS WITH MILD ATYPIA, PERIPHERAL MARGIN INVOLVED        2. Skin, right lower back :       MELANOCYTIC NEVUS, COMPOUND LENTIGINOUS TYPE, LIMITED MARGINS FREE   1- Mild dysplastic Recheck next visit 2- Benign mole No further treatment needed ----- Message ----- From: Interface, Lab In Three Zero One Sent: 06/17/2023   4:54 PM EDT To: Elta Halter, MD

## 2023-06-19 ENCOUNTER — Encounter: Payer: Self-pay | Admitting: Dermatology

## 2023-06-19 NOTE — Telephone Encounter (Addendum)
 Called and discussed bx results with patient. She verbalized understanding and denies further questions. Patient not scheduled for follow up so scheduled patient for 6 month follow up to recheck right upper back.  ----- Message from Celine Collard sent at 06/17/2023  5:41 PM EDT ----- FINAL DIAGNOSIS        1. Skin, right upper back :       DYSPLASTIC COMPOUND NEVUS WITH MILD ATYPIA, PERIPHERAL MARGIN INVOLVED        2. Skin, right lower back :       MELANOCYTIC NEVUS, COMPOUND LENTIGINOUS TYPE, LIMITED MARGINS FREE   1- Mild dysplastic Recheck next visit 2- Benign mole No further treatment needed ----- Message ----- From: Interface, Lab In Three Zero One Sent: 06/17/2023   4:54 PM EDT To: Elta Halter, MD

## 2023-09-27 NOTE — Progress Notes (Signed)
 Ultrasound of the right hand shows changes that could suggest inflammation.  This needs to be interpreted in the clinical context.  I am sending a note to Ms. Tara Bullock

## 2023-10-21 ENCOUNTER — Ambulatory Visit: Admitting: Medical

## 2023-10-21 ENCOUNTER — Other Ambulatory Visit: Payer: Self-pay

## 2023-10-21 ENCOUNTER — Encounter: Payer: Self-pay | Admitting: Family Medicine

## 2023-10-21 ENCOUNTER — Encounter: Payer: Self-pay | Admitting: Medical

## 2023-10-21 VITALS — BP 129/74 | HR 92 | Wt 129.0 lb

## 2023-10-21 DIAGNOSIS — Z30433 Encounter for removal and reinsertion of intrauterine contraceptive device: Secondary | ICD-10-CM

## 2023-10-21 DIAGNOSIS — Z1331 Encounter for screening for depression: Secondary | ICD-10-CM | POA: Diagnosis not present

## 2023-10-21 MED ORDER — LEVONORGESTREL 20 MCG/DAY IU IUD
1.0000 | INTRAUTERINE_SYSTEM | Freq: Once | INTRAUTERINE | Status: AC
  Administered 2023-10-21: 1 via INTRAUTERINE

## 2023-10-21 NOTE — Progress Notes (Signed)
   GYNECOLOGY CLINIC PROCEDURE NOTE  Ms. Chavonne Sforza is a 38 y.o. [phone] here for Mirena IUD removal and insertion. No GYN concerns.  Last pap smear was on 01/11/22 and was normal.  IUD Insertion Procedure Note Patient identified, informed consent performed.  Discussed risks of irregular bleeding, cramping, infection, malpositioning or misplacement of the IUD outside the uterus which may require further procedure such as laparoscopy. Time out was performed.  Urine pregnancy test negative.  Speculum placed in the vagina.  Cervix visualized.  Cleaned with Betadine x 2.  Grasped anteriorly with a single tooth tenaculum.  Uterus sounded to 7 cm.  Mirena IUD placed per manufacturer's recommendations.  Strings trimmed to 3 cm. Tenaculum was removed, good hemostasis noted.  Patient tolerated procedure well.   Patient was given post-procedure instructions.  She was advised to be have backup contraception for one week.  Patient was also asked to check IUD strings periodically and follow up in 4 weeks for IUD check.  Larwence Mliss SAILOR, PA-C 10/21/2023 3:20 PM

## 2023-11-13 ENCOUNTER — Ambulatory Visit: Admitting: Obstetrics and Gynecology

## 2023-11-13 ENCOUNTER — Other Ambulatory Visit: Payer: Self-pay

## 2023-11-13 VITALS — BP 119/63 | HR 92

## 2023-11-13 DIAGNOSIS — Z975 Presence of (intrauterine) contraceptive device: Secondary | ICD-10-CM

## 2023-11-18 NOTE — Progress Notes (Unsigned)
    GYNECOLOGY VISIT  Patient name: Tara Bullock MRN [id number]  Date of birth: 03-15-1985 Chief Complaint:   Follow-up  History:  Tara Bullock here for IUD string check.   The following portions of the patient's history were reviewed and updated as appropriate: allergies, current medications, past family history, past medical history, past social history, past surgical history and problem list.   Health Maintenance:   Last pap     Component Value Date/Time   DIAGPAP  01/11/2022 1115    - Negative for intraepithelial lesion or malignancy (NILM)   HPVHIGH Negative 01/11/2022 1115   ADEQPAP  01/11/2022 1115    Satisfactory for evaluation; transformation zone component ABSENT.    Health Maintenance  Topic Date Due  . Complete foot exam   Never done  . Eye exam for diabetics  Never done  . HIV Screening  Never done  . Yearly kidney health urinalysis for diabetes  Never done  . Hepatitis C Screening  Never done  . HPV Vaccine (1 - Risk 3-dose SCDM series) Never done  . Hemoglobin A1C  06/04/2017  . Yearly kidney function blood test for diabetes  01/08/2020  . Flu Shot  08/02/2023  . COVID-19 Vaccine (4 - 2025-26 season) 09/02/2023  . DTaP/Tdap/Td vaccine (7 - Td or Tdap) 01/09/2024  . Pap with HPV screening  01/12/2027  . Pneumococcal Vaccine  Completed  . Hepatitis B Vaccine  Completed  . Meningitis B Vaccine  Aged Out  . Breast Cancer Screening  Discontinued     Review of Systems:  Pertinent items are noted in HPI. Comprehensive review of systems was otherwise negative.   Objective:  Physical Exam BP 119/63   Pulse 92    Physical Exam Vitals and nursing note reviewed. Exam conducted with a chaperone present.  Constitutional:      Appearance: Normal appearance.  HENT:     Head: Normocephalic and atraumatic.  Pulmonary:     Effort: Pulmonary effort is normal.     Breath sounds: Normal breath sounds.  Genitourinary:    General:  Normal vulva.     Exam position: Lithotomy position.     Vagina: Normal.     Cervix: Normal.  Skin:    General: Skin is warm and dry.  Neurological:     General: No focal deficit present.     Mental Status: She is alert.  Psychiatric:        Mood and Affect: Mood normal.        Behavior: Behavior normal.        Thought Content: Thought content normal.        Judgment: Judgment normal.        Assessment & Plan:   1. IUD (intrauterine device) in place (Primary) Appears to be in appropriate position. Discussed possible menstrual changes and can self check strings or return if there are concerns.    Routine preventative health maintenance measures emphasized.  Carter Quarry, MD [facility], [facility]

## 2023-12-10 ENCOUNTER — Ambulatory Visit: Admitting: Dermatology

## 2023-12-23 ENCOUNTER — Ambulatory Visit: Admitting: Dermatology

## 2024-01-13 ENCOUNTER — Ambulatory Visit

## 2024-01-13 DIAGNOSIS — D229 Melanocytic nevi, unspecified: Secondary | ICD-10-CM

## 2024-01-13 DIAGNOSIS — Z86018 Personal history of other benign neoplasm: Secondary | ICD-10-CM

## 2024-01-13 DIAGNOSIS — I781 Nevus, non-neoplastic: Secondary | ICD-10-CM | POA: Diagnosis not present

## 2024-01-13 DIAGNOSIS — L988 Other specified disorders of the skin and subcutaneous tissue: Secondary | ICD-10-CM

## 2024-01-13 NOTE — Progress Notes (Signed)
" °  °  Subjective   Tara Bullock is a 39 y.o. female who presents for the following: Lesion(s) of concern . Patient is established patient   Today patient reports: Recheck LOC on right upper back   Review of Systems:    No other skin or systemic complaints except as noted in HPI or Assessment and Plan.  The following portions of the chart were reviewed this encounter and updated as appropriate: medications, allergies, medical history  Relevant Medical History:  n/a   Objective  (SKPE) Well appearing patient in no apparent distress; mood and affect are within normal limits. Examination was performed of the: Focused Exam of: back   Examination notable for: R upper back with well healed scar, some repigmentation at superior aspect R lower back with well healed scar, some repigmentation centrally Elastosis of face  Examination limited by: Clothing and Patient deferred removal       Assessment & Plan  (SKAP)    Personal history of dysplastic nevi  Mildly dysplastic nevus removed 06/12/23 from right upper back  Compound nevus of R lower back removed 06/12/23  - some repigmentation today - discussed with monitor and recheck in 53mo  - Reviewed medical history for full details  - Reviewed sun protective measures as above - Encouraged full body skin exams   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 For cosmesis, best approach is vascular laser. Considered cosmetic service.  Patient will schedule Botox appointment for forehead and frown complex 20 units frown complex and 8-10 for the forehead; discussed pricing of $13.50 per unit and out of pocket cost    Was sun protection counseling provided?: Yes   Level of service outlined above   Patient instructions (SKPI)   Procedures, orders, diagnosis for this visit:    There are no diagnoses linked to this encounter.  Return to clinic: Return in  about 6 months (around 07/12/2024) for TBSE, whenever patient wants for botox .  I, Emerick Ege, CMA am acting as scribe for Lauraine JAYSON Kanaris, MD.   Documentation: I have reviewed the above documentation for accuracy and completeness, and I agree with the above.  Lauraine JAYSON Kanaris, MD  "

## 2024-01-13 NOTE — Patient Instructions (Signed)

## 2024-01-29 ENCOUNTER — Telehealth: Payer: Self-pay

## 2024-01-29 NOTE — Telephone Encounter (Signed)
 Patient left voicemail that she typically buys the perfect A from our office but now located in [place] and it is hard to get here. She asking can we send in prescription of Tretinoin  to her pharmacy?   [facility].

## 2024-01-30 MED ORDER — TRETINOIN 0.1 % EX CREA: TOPICAL_CREAM | Freq: Every day | CUTANEOUS | 2 refills | Status: AC

## 2024-01-30 NOTE — Addendum Note (Signed)
 Addended by: TERESA PALMA R on: 01/30/2024 09:33 AM   Modules accepted: Orders

## 2024-07-20 ENCOUNTER — Ambulatory Visit: Admitting: Dermatology
